# Patient Record
Sex: Female | Born: 1955 | Race: White | Hispanic: No | Marital: Married | Smoking: Never smoker
Health system: Southern US, Community
[De-identification: ages and names within clinical notes are randomized; demographics above are authoritative.]

## PROBLEM LIST (undated history)

## (undated) DIAGNOSIS — B029 Zoster without complications: Secondary | ICD-10-CM

## (undated) DIAGNOSIS — F40240 Claustrophobia: Secondary | ICD-10-CM

## (undated) DIAGNOSIS — K859 Acute pancreatitis without necrosis or infection, unspecified: Secondary | ICD-10-CM

## (undated) HISTORY — PX: BREAST BIOPSY: SHX20

## (undated) HISTORY — DX: Claustrophobia: F40.240

## (undated) HISTORY — PX: CHOLECYSTECTOMY: SHX55

## (undated) HISTORY — PX: EXPLORATORY LAPAROTOMY: SUR591

## (undated) HISTORY — DX: Acute pancreatitis without necrosis or infection, unspecified: K85.90

## (undated) HISTORY — DX: Zoster without complications: B02.9

## (undated) HISTORY — PX: TRIGGER FINGER RELEASE: SHX641

---

## 1999-04-13 ENCOUNTER — Other Ambulatory Visit: Admission: RE | Admit: 1999-04-13 | Discharge: 1999-04-13 | Payer: Self-pay | Admitting: Obstetrics and Gynecology

## 2000-08-30 ENCOUNTER — Other Ambulatory Visit: Admission: RE | Admit: 2000-08-30 | Discharge: 2000-08-30 | Payer: Self-pay | Admitting: Obstetrics and Gynecology

## 2001-09-25 ENCOUNTER — Other Ambulatory Visit: Admission: RE | Admit: 2001-09-25 | Discharge: 2001-09-25 | Payer: Self-pay | Admitting: Obstetrics and Gynecology

## 2002-12-22 ENCOUNTER — Other Ambulatory Visit: Admission: RE | Admit: 2002-12-22 | Discharge: 2002-12-22 | Payer: Self-pay | Admitting: Obstetrics and Gynecology

## 2004-09-18 DIAGNOSIS — B029 Zoster without complications: Secondary | ICD-10-CM

## 2004-09-18 HISTORY — DX: Zoster without complications: B02.9

## 2005-04-05 ENCOUNTER — Other Ambulatory Visit: Admission: RE | Admit: 2005-04-05 | Discharge: 2005-04-05 | Payer: Self-pay | Admitting: Obstetrics and Gynecology

## 2005-09-18 LAB — HM COLONOSCOPY

## 2006-04-10 ENCOUNTER — Ambulatory Visit: Payer: Self-pay | Admitting: Family Medicine

## 2006-04-13 ENCOUNTER — Ambulatory Visit: Payer: Self-pay | Admitting: Family Medicine

## 2006-05-09 ENCOUNTER — Ambulatory Visit: Payer: Self-pay | Admitting: Unknown Physician Specialty

## 2006-07-02 ENCOUNTER — Ambulatory Visit: Payer: Self-pay | Admitting: Unknown Physician Specialty

## 2007-05-15 ENCOUNTER — Other Ambulatory Visit: Admission: RE | Admit: 2007-05-15 | Discharge: 2007-05-15 | Payer: Self-pay | Admitting: Obstetrics and Gynecology

## 2008-05-28 ENCOUNTER — Other Ambulatory Visit: Admission: RE | Admit: 2008-05-28 | Discharge: 2008-05-28 | Payer: Self-pay | Admitting: Obstetrics and Gynecology

## 2008-06-05 ENCOUNTER — Inpatient Hospital Stay: Payer: Self-pay | Admitting: Internal Medicine

## 2008-06-05 ENCOUNTER — Other Ambulatory Visit: Payer: Self-pay

## 2008-06-15 ENCOUNTER — Ambulatory Visit: Payer: Self-pay | Admitting: Unknown Physician Specialty

## 2008-06-26 ENCOUNTER — Ambulatory Visit: Payer: Self-pay | Admitting: Gastroenterology

## 2008-09-18 LAB — HM PAP SMEAR

## 2009-06-07 ENCOUNTER — Other Ambulatory Visit: Admission: RE | Admit: 2009-06-07 | Discharge: 2009-06-07 | Payer: Self-pay | Admitting: Obstetrics and Gynecology

## 2010-07-21 ENCOUNTER — Other Ambulatory Visit: Admission: RE | Admit: 2010-07-21 | Discharge: 2010-07-21 | Payer: Self-pay | Admitting: Obstetrics and Gynecology

## 2010-09-13 ENCOUNTER — Ambulatory Visit: Payer: Self-pay | Admitting: General Surgery

## 2010-09-14 LAB — PATHOLOGY REPORT

## 2011-02-17 LAB — HM MAMMOGRAPHY

## 2011-10-19 ENCOUNTER — Encounter: Payer: Self-pay | Admitting: Internal Medicine

## 2011-10-19 ENCOUNTER — Ambulatory Visit (INDEPENDENT_AMBULATORY_CARE_PROVIDER_SITE_OTHER): Payer: BC Managed Care – PPO | Admitting: Internal Medicine

## 2011-10-19 ENCOUNTER — Other Ambulatory Visit (HOSPITAL_COMMUNITY)
Admission: RE | Admit: 2011-10-19 | Discharge: 2011-10-19 | Disposition: A | Discharge status: Home / Self Care | Payer: BC Managed Care – PPO | Source: Ambulatory Visit | Attending: Internal Medicine | Admitting: Internal Medicine

## 2011-10-19 DIAGNOSIS — Z1159 Encounter for screening for other viral diseases: Secondary | ICD-10-CM | POA: Insufficient documentation

## 2011-10-19 DIAGNOSIS — Z01419 Encounter for gynecological examination (general) (routine) without abnormal findings: Secondary | ICD-10-CM | POA: Insufficient documentation

## 2011-10-19 DIAGNOSIS — Z0000 Encounter for general adult medical examination without abnormal findings: Secondary | ICD-10-CM | POA: Insufficient documentation

## 2011-10-19 LAB — COMPREHENSIVE METABOLIC PANEL
ALT: 21 U/L (ref 0–35)
AST: 23 U/L (ref 0–37)
Albumin: 4.8 g/dL (ref 3.5–5.2)
Alkaline Phosphatase: 78 U/L (ref 39–117)
BUN: 14 mg/dL (ref 6–23)
CO2: 26 mEq/L (ref 19–32)
Calcium: 9.4 mg/dL (ref 8.4–10.5)
Chloride: 105 mEq/L (ref 96–112)
Creatinine, Ser: 0.6 mg/dL (ref 0.4–1.2)
GFR: 107.05 mL/min (ref >60.00)
Glucose, Bld: 89 mg/dL (ref 70–99)
Potassium: 3.7 mEq/L (ref 3.5–5.1)
Sodium: 141 mEq/L (ref 135–145)
Total Bilirubin: 1 mg/dL (ref 0.3–1.2)
Total Protein: 7.8 g/dL (ref 6.0–8.3)

## 2011-10-19 LAB — CBC WITH DIFFERENTIAL/PLATELET
Basophils Absolute: 0 10*3/uL (ref 0.0–0.1)
Basophils Relative: 0.6 % (ref 0.0–3.0)
Eosinophils Absolute: 0.1 10*3/uL (ref 0.0–0.7)
Eosinophils Relative: 1.4 % (ref 0.0–5.0)
HCT: 41.7 % (ref 36.0–46.0)
Hemoglobin: 14.4 g/dL (ref 12.0–15.0)
Lymphocytes Relative: 41 % (ref 12.0–46.0)
Lymphs Abs: 2.1 10*3/uL (ref 0.7–4.0)
MCHC: 34.5 g/dL (ref 30.0–36.0)
MCV: 97.6 fl (ref 78.0–100.0)
Monocytes Absolute: 0.3 10*3/uL (ref 0.1–1.0)
Monocytes Relative: 6.2 % (ref 3.0–12.0)
Neutro Abs: 2.6 10*3/uL (ref 1.4–7.7)
Neutrophils Relative %: 50.8 % (ref 43.0–77.0)
Platelets: 207 10*3/uL (ref 150.0–400.0)
RBC: 4.27 Mil/uL (ref 3.87–5.11)
RDW: 13.2 % (ref 11.5–14.6)
WBC: 5.1 10*3/uL (ref 4.5–10.5)

## 2011-10-19 LAB — LIPID PANEL
Cholesterol: 288 mg/dL — ABNORMAL HIGH (ref 0–200)
HDL: 110.4 mg/dL (ref >39.00)
Total CHOL/HDL Ratio: 3
Triglycerides: 51 mg/dL (ref 0.0–149.0)
VLDL: 10.2 mg/dL (ref 0.0–40.0)

## 2011-10-19 LAB — LDL CHOLESTEROL, DIRECT: Direct LDL: 151.3 mg/dL

## 2011-10-19 NOTE — Assessment & Plan Note (Signed)
Exam is normal today. Pap is pending. Patient is up-to-date on health maintenance. Will check CBC, CMP, lipid profile with labs today. Followup in one year.

## 2011-10-19 NOTE — Progress Notes (Signed)
Subjective:    Patient ID: Kelly [name], female    DOB: 1956-06-26, 56 y.o.   MRN: [id number]  HPI 56 year old female presents for her annual exam. She denies any complaints today. She follows a healthy lifestyle with healthy diet and regular exercise. She exercises by a using an elliptical machine several days per week. She reports normal energy level. She denies any recent fever, chills, fatigue, or other complaints.  Outpatient Encounter Prescriptions as of 10/19/2011  Medication Sig Dispense Refill  . cholecalciferol (VITAMIN D) 1000 UNITS tablet Take 1,000 Units by mouth daily.        Review of Systems  Constitutional: Negative for fever, chills, appetite change, fatigue and unexpected weight change.  HENT: Negative for ear pain, congestion, sore throat, trouble swallowing, neck pain, voice change and sinus pressure.   Eyes: Negative for visual disturbance.  Respiratory: Negative for cough, shortness of breath, wheezing and stridor.   Cardiovascular: Negative for chest pain, palpitations and leg swelling.  Gastrointestinal: Negative for nausea, vomiting, abdominal pain, diarrhea, constipation, blood in stool, abdominal distention and anal bleeding.  Genitourinary: Negative for dysuria and flank pain.  Musculoskeletal: Negative for myalgias, arthralgias and gait problem.  Skin: Negative for color change and rash.  Neurological: Negative for dizziness and headaches.  Hematological: Negative for adenopathy. Does not bruise/bleed easily.  Psychiatric/Behavioral: Negative for suicidal ideas, sleep disturbance and dysphoric mood. The patient is not nervous/anxious.    BP 88/58  Pulse 79  Temp(Src) 97.9 F (36.6 C) (Oral)  Ht 5\' 3"  (1.6 m)  Wt 140 lb (63.504 kg)  BMI 24.80 kg/m2  SpO2 97%     Objective:   Physical Exam  Constitutional: She is oriented to person, place, and time. She appears well-developed and well-nourished. No distress.  HENT:  Head: Normocephalic and  atraumatic.  Right Ear: External ear normal.  Left Ear: External ear normal.  Nose: Nose normal.  Mouth/Throat: Oropharynx is clear and moist. No oropharyngeal exudate.  Eyes: Conjunctivae are normal. Pupils are equal, round, and reactive to light. Right eye exhibits no discharge. Left eye exhibits no discharge. No scleral icterus.  Neck: Normal range of motion. Neck supple. No tracheal deviation present. No thyromegaly present.  Cardiovascular: Normal rate, regular rhythm, normal heart sounds and intact distal pulses.  Exam reveals no gallop and no friction rub.   No murmur heard. Pulmonary/Chest: Effort normal and breath sounds normal. No respiratory distress. She has no wheezes. She has no rales. She exhibits no tenderness.  Abdominal: Soft. Bowel sounds are normal. She exhibits no distension and no mass. There is no tenderness. There is no rebound and no guarding.  Genitourinary: Vagina normal and uterus normal. No breast swelling, tenderness, discharge or bleeding. Pelvic exam was performed with patient prone. There is no rash, tenderness or lesion on the right labia. There is no rash, tenderness or lesion on the left labia. Uterus is not enlarged and not tender. Cervix exhibits no motion tenderness, no discharge and no friability. Right adnexum displays no mass, no tenderness and no fullness. Left adnexum displays no mass, no tenderness and no fullness. No erythema or tenderness around the vagina. No vaginal discharge found.  Musculoskeletal: Normal range of motion. She exhibits no edema and no tenderness.  Lymphadenopathy:    She has no cervical adenopathy.  Neurological: She is alert and oriented to person, place, and time. No cranial nerve deficit. She exhibits normal muscle tone. Coordination normal.  Skin: Skin is warm and dry. No rash noted. She  is not diaphoretic. No erythema. No pallor.  Psychiatric: She has a normal mood and affect. Her behavior is normal. Judgment and thought content  normal.          Assessment & Plan:

## 2011-10-25 LAB — HM PAP SMEAR: HM Pap smear: NEGATIVE

## 2011-10-27 ENCOUNTER — Telehealth: Payer: Self-pay | Admitting: Internal Medicine

## 2011-10-27 NOTE — Telephone Encounter (Signed)
PAP normal

## 2011-11-20 ENCOUNTER — Ambulatory Visit: Payer: Self-pay | Admitting: Gastroenterology

## 2011-11-20 LAB — HM COLONOSCOPY

## 2011-12-07 ENCOUNTER — Encounter: Payer: Self-pay | Admitting: Internal Medicine

## 2012-11-02 ENCOUNTER — Other Ambulatory Visit: Payer: Self-pay

## 2012-11-07 LAB — HM MAMMOGRAPHY: HM Mammogram: NORMAL

## 2012-11-21 ENCOUNTER — Ambulatory Visit (INDEPENDENT_AMBULATORY_CARE_PROVIDER_SITE_OTHER): Payer: 59 | Admitting: Internal Medicine

## 2012-11-21 ENCOUNTER — Encounter: Payer: Self-pay | Admitting: Internal Medicine

## 2012-11-21 VITALS — BP 98/64 | HR 63 | Temp 98.0°F | Ht 63.0 in | Wt 144.0 lb

## 2012-11-21 DIAGNOSIS — Z0000 Encounter for general adult medical examination without abnormal findings: Secondary | ICD-10-CM

## 2012-11-21 NOTE — Progress Notes (Signed)
Subjective:    Patient ID: Kelly [name], female    DOB: October 12, 1955, 57 y.o.   MRN: [id number]  HPI 57YO female presents for annual exam. Doing well. No new concerns today. In 2013, had abnormal mammogram with microcalcifications in left breast. Underwent breast biopsy, which was normal. Had follow up mammogram in 10/2012 which was normal per pt. Follows healthy diet and regular exercise program.   Outpatient Encounter Prescriptions as of 11/21/2012  Medication Sig Dispense Refill  . cholecalciferol (VITAMIN D) 1000 UNITS tablet Take 1,000 Units by mouth daily.       No facility-administered encounter medications on file as of 11/21/2012.   BP 98/64  Pulse 63  Temp(Src) 98 F (36.7 C) (Oral)  Ht 5\' 3"  (1.6 m)  Wt 144 lb (65.318 kg)  BMI 25.51 kg/m2  SpO2 97%  Review of Systems  Constitutional: Negative for fever, chills, appetite change, fatigue and unexpected weight change.  HENT: Negative for ear pain, congestion, sore throat, trouble swallowing, neck pain, voice change and sinus pressure.   Eyes: Negative for visual disturbance.  Respiratory: Negative for cough, shortness of breath, wheezing and stridor.   Cardiovascular: Negative for chest pain, palpitations and leg swelling.  Gastrointestinal: Negative for nausea, vomiting, abdominal pain, diarrhea, constipation, blood in stool, abdominal distention and anal bleeding.  Genitourinary: Negative for dysuria and flank pain.  Musculoskeletal: Negative for myalgias, arthralgias and gait problem.  Skin: Negative for color change and rash.  Neurological: Negative for dizziness and headaches.  Hematological: Negative for adenopathy. Does not bruise/bleed easily.  Psychiatric/Behavioral: Negative for suicidal ideas, sleep disturbance and dysphoric mood. The patient is not nervous/anxious.        Objective:   Physical Exam  Constitutional: She is oriented to person, place, and time. She appears well-developed and well-nourished. No  distress.  HENT:  Head: Normocephalic and atraumatic.  Right Ear: External ear normal.  Left Ear: External ear normal.  Nose: Nose normal.  Mouth/Throat: Oropharynx is clear and moist. No oropharyngeal exudate.  Eyes: Conjunctivae are normal. Pupils are equal, round, and reactive to light. Right eye exhibits no discharge. Left eye exhibits no discharge. No scleral icterus.  Neck: Normal range of motion. Neck supple. No tracheal deviation present. No thyromegaly present.  Cardiovascular: Normal rate, regular rhythm, normal heart sounds and intact distal pulses.  Exam reveals no gallop and no friction rub.   No murmur heard. Pulmonary/Chest: Effort normal and breath sounds normal. No accessory muscle usage. Not tachypneic. No respiratory distress. She has no decreased breath sounds. She has no wheezes. She has no rhonchi. She has no rales. She exhibits no tenderness. Right breast exhibits no inverted nipple, no mass, no nipple discharge, no skin change and no tenderness. Left breast exhibits no inverted nipple, no mass, no nipple discharge, no skin change and no tenderness. Breasts are symmetrical.  Abdominal: Soft. Bowel sounds are normal. She exhibits no distension and no mass. There is no tenderness. There is no rebound and no guarding.  Musculoskeletal: Normal range of motion. She exhibits no edema and no tenderness.  Lymphadenopathy:    She has no cervical adenopathy.  Neurological: She is alert and oriented to person, place, and time. No cranial nerve deficit. She exhibits normal muscle tone. Coordination normal.  Skin: Skin is warm and dry. No rash noted. She is not diaphoretic. No erythema. No pallor.  Psychiatric: She has a normal mood and affect. Her behavior is normal. Judgment and thought content normal.  Assessment & Plan:

## 2012-11-21 NOTE — Assessment & Plan Note (Signed)
General medical exam normal today including breast exam. PAP deferred given PAP 2013 normal and HPV neg. Pt will schedule mammogram through her office. Will request records on previous mammograms. Will check fasting labs including CMP, CBC, lipids with labs. Encouraged continued healthy diet and regular physical activity. Follow up 1 year and prn.

## 2013-07-24 ENCOUNTER — Other Ambulatory Visit: Payer: Self-pay

## 2013-10-14 ENCOUNTER — Telehealth: Payer: Self-pay | Admitting: Internal Medicine

## 2013-10-14 NOTE — Telephone Encounter (Signed)
Pt left vm wanting to schedule cpe.  Tried calling pt back to schedule, no answer, no machine.  Unable to leave msg.  Last cpe 11/21/2012.

## 2013-10-21 NOTE — Telephone Encounter (Signed)
Left detailed message for patient to call the office and schedule an appointment with Dr. Gilford Rile. Any of the front desk could help her schedule this.

## 2013-10-23 ENCOUNTER — Encounter: Payer: Self-pay | Admitting: Internal Medicine

## 2013-10-24 ENCOUNTER — Ambulatory Visit (INDEPENDENT_AMBULATORY_CARE_PROVIDER_SITE_OTHER): Payer: 59 | Admitting: Podiatry

## 2013-10-24 ENCOUNTER — Encounter: Payer: Self-pay | Admitting: Podiatry

## 2013-10-24 VITALS — BP 129/60 | HR 76 | Resp 16

## 2013-10-24 DIAGNOSIS — M722 Plantar fascial fibromatosis: Secondary | ICD-10-CM

## 2013-10-24 MED ORDER — TRIAMCINOLONE ACETONIDE 10 MG/ML IJ SUSP
10.0000 mg | Freq: Once | INTRAMUSCULAR | Status: AC
Start: 2013-10-24 — End: 2013-10-24
  Administered 2013-10-24: 10 mg

## 2013-10-24 NOTE — Progress Notes (Signed)
Subjective:     Patient ID: Kelly [name], female   DOB: 09/29/1955, 58 y.o.   MRN: [id number]  HPI patient states her heel has been hurting that she's not been able to get in here and it's been quite tender when she sits and gets up on it or when she gets up in the morning. Not as bad as she ambulates through the day   Review of Systems     Objective:   Physical Exam Neurovascular status is found to be intact with discomfort of an intense nature upon palpation plantar heel at the insertion of the tendon into the calcaneus    Assessment:     Plantar fasciitis left heel of an acute nature    Plan:     Reviewed the chronic and the acute nature of plantar fasciitis and today injected the plantar fascia 3 mg Kenalog 5 like Marcaine mixture and dispensed night splint for the acute nature of this condition. I then went ahead and I scanned for custom orthotics to try to help with the chronic element her condition

## 2013-10-24 NOTE — Progress Notes (Signed)
Left heel has been the same since last visit. More painful when off of foot , and get back up on it

## 2013-11-04 ENCOUNTER — Encounter: Payer: Self-pay | Admitting: *Deleted

## 2013-11-04 NOTE — Progress Notes (Signed)
Sent pt postcard to let her know orthotics are here. 

## 2013-11-06 LAB — HM MAMMOGRAPHY: HM Mammogram: NORMAL

## 2013-11-10 ENCOUNTER — Encounter: Payer: Self-pay | Admitting: *Deleted

## 2013-11-21 ENCOUNTER — Ambulatory Visit (INDEPENDENT_AMBULATORY_CARE_PROVIDER_SITE_OTHER): Payer: 59 | Admitting: Podiatry

## 2013-11-21 VITALS — BP 108/64 | HR 70 | Resp 16 | Ht 63.0 in | Wt 144.0 lb

## 2013-11-21 DIAGNOSIS — M722 Plantar fascial fibromatosis: Secondary | ICD-10-CM

## 2013-11-21 NOTE — Progress Notes (Signed)
Subjective:     Patient ID: Kelly [name], female   DOB: 01/07/1956, 58 y.o.   MRN: [id number]  HPI patient presents stating my heel is feeling better than previously with mild discomfort if I have done a lot of walking   Review of Systems     Objective:   Physical Exam Neurovascular status intact with significant diminishment of discomfort left heel with mild pain with deep pressure    Assessment:     Plan her fasciitis improving left foot    Plan:     H&P performed and at this time dispensed orthotics with instructions and discussed continued physical therapy for this patient

## 2013-11-21 NOTE — Patient Instructions (Signed)

## 2013-12-04 ENCOUNTER — Encounter: Payer: Self-pay | Admitting: Internal Medicine

## 2013-12-04 ENCOUNTER — Ambulatory Visit (INDEPENDENT_AMBULATORY_CARE_PROVIDER_SITE_OTHER): Payer: 59 | Admitting: Internal Medicine

## 2013-12-04 VITALS — BP 100/70 | HR 67 | Temp 98.3°F | Resp 15 | Ht 63.0 in | Wt 146.0 lb

## 2013-12-04 DIAGNOSIS — Z0000 Encounter for general adult medical examination without abnormal findings: Secondary | ICD-10-CM

## 2013-12-04 MED ORDER — ZOSTER VACCINE LIVE 19400 UNT/0.65ML ~~LOC~~ SOLR: 0.6500 mL | Freq: Once | SUBCUTANEOUS | Status: DC

## 2013-12-04 NOTE — Assessment & Plan Note (Signed)
General medical exam including breast exam normal today. PAP and pelvic deferred as normal 2013. Plan repeat 2013. Mammogram reviewed and was normal. Colonoscopy UTD. Will check labs including CBC, CMP, lipids, TSH, VitD.

## 2013-12-04 NOTE — Progress Notes (Signed)
Subjective:    Patient ID: Kelly [name], female    DOB: Apr 24, 1956, 58 y.o.   MRN: [id number]  HPI 58YO female presents for annual exam.  Left foot plantar fasciitis - s/p cortisone injection x 2 Dr. Paulla Dolly.  Flu symptoms in January, now resolved.  Generally, feeling well. Had recent mammogram which was normal. Last PAP 2013 normal, HPV neg.  Continues to follow healthy diet. Exercises by working out at [employer] several days per week.    Review of Systems  Constitutional: Negative for fever, chills, appetite change, fatigue and unexpected weight change.  HENT: Negative for congestion, ear pain, sinus pressure, sore throat, trouble swallowing and voice change.   Eyes: Negative for visual disturbance.  Respiratory: Negative for cough, shortness of breath, wheezing and stridor.   Cardiovascular: Negative for chest pain, palpitations and leg swelling.  Gastrointestinal: Negative for nausea, vomiting, abdominal pain, diarrhea, constipation, blood in stool, abdominal distention and anal bleeding.  Genitourinary: Negative for dysuria and flank pain.  Musculoskeletal: Negative for arthralgias, gait problem, myalgias and neck pain.  Skin: Negative for color change and rash.  Neurological: Negative for dizziness and headaches.  Hematological: Negative for adenopathy. Does not bruise/bleed easily.  Psychiatric/Behavioral: Negative for suicidal ideas, sleep disturbance and dysphoric mood. The patient is not nervous/anxious.        Objective:    BP 100/70  Pulse 67  Temp(Src) 98.3 F (36.8 C) (Oral)  Resp 15  Ht 5\' 3"  (1.6 m)  Wt 146 lb (66.225 kg)  BMI 25.87 kg/m2  SpO2 97% Physical Exam  Constitutional: She is oriented to person, place, and time. She appears well-developed and well-nourished. No distress.  HENT:  Head: Normocephalic and atraumatic.  Right Ear: External ear normal.  Left Ear: External ear normal.  Nose: Nose normal.  Mouth/Throat: Oropharynx is clear and  moist. No oropharyngeal exudate.  Eyes: Conjunctivae are normal. Pupils are equal, round, and reactive to light. Right eye exhibits no discharge. Left eye exhibits no discharge. No scleral icterus.  Neck: Normal range of motion. Neck supple. No tracheal deviation present. No thyromegaly present.  Cardiovascular: Normal rate, regular rhythm, normal heart sounds and intact distal pulses.  Exam reveals no gallop and no friction rub.   No murmur heard. Pulmonary/Chest: Effort normal and breath sounds normal. No accessory muscle usage. Not tachypneic. No respiratory distress. She has no decreased breath sounds. She has no wheezes. She has no rales. She exhibits no tenderness. Right breast exhibits no inverted nipple, no mass, no nipple discharge, no skin change and no tenderness. Left breast exhibits no inverted nipple, no mass, no nipple discharge, no skin change and no tenderness. Breasts are symmetrical.  Abdominal: Soft. Bowel sounds are normal. She exhibits no distension and no mass. There is no tenderness. There is no rebound and no guarding.  Musculoskeletal: Normal range of motion. She exhibits no edema and no tenderness.  Lymphadenopathy:    She has no cervical adenopathy.  Neurological: She is alert and oriented to person, place, and time. No cranial nerve deficit. She exhibits normal muscle tone. Coordination normal.  Skin: Skin is warm and dry. No rash noted. She is not diaphoretic. No erythema. No pallor.  Psychiatric: She has a normal mood and affect. Her behavior is normal. Judgment and thought content normal.          Assessment & Plan:   Problem List Items Addressed This Visit   Routine general medical examination at a health care facility - Primary  General medical exam including breast exam normal today. PAP and pelvic deferred as normal 2013. Plan repeat 2013. Mammogram reviewed and was normal. Colonoscopy UTD. Will check labs including CBC, CMP, lipids, TSH, VitD.      Relevant Orders      CBC with Differential      Comprehensive metabolic panel      Lipid panel      Microalbumin / creatinine urine ratio      Vit D  25 hydroxy (rtn osteoporosis monitoring)      TSH       Return in about 1 year (around 12/05/2014) for Physical.

## 2013-12-04 NOTE — Progress Notes (Signed)
Pre visit review using our clinic review tool, if applicable. No additional management support is needed unless otherwise documented below in the visit note. 

## 2013-12-09 ENCOUNTER — Other Ambulatory Visit (INDEPENDENT_AMBULATORY_CARE_PROVIDER_SITE_OTHER): Payer: 59

## 2013-12-09 DIAGNOSIS — Z0000 Encounter for general adult medical examination without abnormal findings: Secondary | ICD-10-CM

## 2013-12-09 LAB — LIPID PANEL
Cholesterol: 278 mg/dL — ABNORMAL HIGH (ref 0–200)
HDL: 99.4 mg/dL (ref >39.00)
LDL Cholesterol: 167 mg/dL — ABNORMAL HIGH (ref 0–99)
Total CHOL/HDL Ratio: 3
Triglycerides: 60 mg/dL (ref 0.0–149.0)
VLDL: 12 mg/dL (ref 0.0–40.0)

## 2013-12-09 LAB — CBC WITH DIFFERENTIAL/PLATELET
Basophils Absolute: 0 10*3/uL (ref 0.0–0.1)
Basophils Relative: 0.4 % (ref 0.0–3.0)
Eosinophils Absolute: 0.1 10*3/uL (ref 0.0–0.7)
Eosinophils Relative: 1.4 % (ref 0.0–5.0)
HCT: 42.4 % (ref 36.0–46.0)
Hemoglobin: 14.2 g/dL (ref 12.0–15.0)
Lymphocytes Relative: 37.3 % (ref 12.0–46.0)
Lymphs Abs: 2.1 10*3/uL (ref 0.7–4.0)
MCHC: 33.5 g/dL (ref 30.0–36.0)
MCV: 97 fl (ref 78.0–100.0)
Monocytes Absolute: 0.3 10*3/uL (ref 0.1–1.0)
Monocytes Relative: 5.9 % (ref 3.0–12.0)
Neutro Abs: 3.1 10*3/uL (ref 1.4–7.7)
Neutrophils Relative %: 55 % (ref 43.0–77.0)
Platelets: 215 10*3/uL (ref 150.0–400.0)
RBC: 4.37 Mil/uL (ref 3.87–5.11)
RDW: 13.5 % (ref 11.5–14.6)
WBC: 5.7 10*3/uL (ref 4.5–10.5)

## 2013-12-09 LAB — COMPREHENSIVE METABOLIC PANEL
ALT: 19 U/L (ref 0–35)
AST: 19 U/L (ref 0–37)
Albumin: 4.7 g/dL (ref 3.5–5.2)
Alkaline Phosphatase: 63 U/L (ref 39–117)
BUN: 19 mg/dL (ref 6–23)
CO2: 25 mEq/L (ref 19–32)
Calcium: 9.7 mg/dL (ref 8.4–10.5)
Chloride: 103 mEq/L (ref 96–112)
Creatinine, Ser: 0.7 mg/dL (ref 0.4–1.2)
GFR: 95.36 mL/min (ref >60.00)
Glucose, Bld: 93 mg/dL (ref 70–99)
Potassium: 3.7 mEq/L (ref 3.5–5.1)
Sodium: 138 mEq/L (ref 135–145)
Total Bilirubin: 1.3 mg/dL — ABNORMAL HIGH (ref 0.3–1.2)
Total Protein: 7.7 g/dL (ref 6.0–8.3)

## 2013-12-09 LAB — MICROALBUMIN / CREATININE URINE RATIO
Creatinine,U: 116.8 mg/dL
Microalb Creat Ratio: 3.9 mg/g (ref 0.0–30.0)
Microalb, Ur: 4.5 mg/dL — ABNORMAL HIGH (ref 0.0–1.9)

## 2013-12-09 LAB — TSH: TSH: 3 u[IU]/mL (ref 0.35–5.50)

## 2013-12-10 LAB — VITAMIN D 25 HYDROXY (VIT D DEFICIENCY, FRACTURES): Vit D, 25-Hydroxy: 47 ng/mL (ref 30–89)

## 2013-12-15 ENCOUNTER — Encounter: Payer: Self-pay | Admitting: Internal Medicine

## 2014-01-02 ENCOUNTER — Ambulatory Visit: Payer: 59 | Admitting: Podiatry

## 2014-01-16 ENCOUNTER — Ambulatory Visit (INDEPENDENT_AMBULATORY_CARE_PROVIDER_SITE_OTHER): Payer: 59 | Admitting: Podiatry

## 2014-01-16 VITALS — Resp 16 | Ht 63.0 in | Wt 144.0 lb

## 2014-01-16 DIAGNOSIS — M722 Plantar fascial fibromatosis: Secondary | ICD-10-CM

## 2014-01-18 NOTE — Progress Notes (Signed)
Subjective:     Patient ID: Kelly [name], female   DOB: Nov 27, 1955, 58 y.o.   MRN: [id number]  HPI patient states that the pain has reduced quite a bit in her left foot and she still walking somewhat with discomfort only after extended period   Review of Systems     Objective:   Physical Exam Neurovascular status intact   with patient having minimal discomfort plantar heel left at the insertion of the tendon into the calcaneus Assessment:     Improve plantar fasciitis left with orthotics and stretch    Plan:     Reviewed continuation of conservative care and hope that we will not require more advanced type treatments. Reappoint if symptoms were to recur

## 2014-09-20 ENCOUNTER — Ambulatory Visit: Payer: Self-pay | Admitting: Obstetrics & Gynecology

## 2014-09-20 LAB — COMPREHENSIVE METABOLIC PANEL
Albumin: 4 g/dL (ref 3.4–5.0)
Alkaline Phosphatase: 86 U/L
Anion Gap: 8 (ref 7–16)
BUN: 15 mg/dL (ref 7–18)
Bilirubin,Total: 1 mg/dL (ref 0.2–1.0)
Calcium, Total: 9 mg/dL (ref 8.5–10.1)
Chloride: 106 mmol/L (ref 98–107)
Co2: 27 mmol/L (ref 21–32)
Creatinine: 0.64 mg/dL (ref 0.60–1.30)
EGFR (African American): >60
EGFR (Non-African Amer.): >60
Glucose: 82 mg/dL (ref 65–99)
Osmolality: 281 (ref 275–301)
Potassium: 3.7 mmol/L (ref 3.5–5.1)
SGOT(AST): 27 U/L (ref 15–37)
SGPT (ALT): 25 U/L
Sodium: 141 mmol/L (ref 136–145)
Total Protein: 7.8 g/dL (ref 6.4–8.2)

## 2014-09-20 LAB — CBC WITH DIFFERENTIAL/PLATELET
Basophil #: 0 10*3/uL (ref 0.0–0.1)
Basophil %: 0.8 %
Eosinophil #: 0.1 10*3/uL (ref 0.0–0.7)
Eosinophil %: 1.1 %
HCT: 43 % (ref 35.0–47.0)
HGB: 14.3 g/dL (ref 12.0–16.0)
Lymphocyte #: 2.1 10*3/uL (ref 1.0–3.6)
Lymphocyte %: 31.5 %
MCH: 33 pg (ref 26.0–34.0)
MCHC: 33.3 g/dL (ref 32.0–36.0)
MCV: 99 fL (ref 80–100)
Monocyte #: 0.4 x10 3/mm (ref 0.2–0.9)
Monocyte %: 5.8 %
Neutrophil #: 4 10*3/uL (ref 1.4–6.5)
Neutrophil %: 60.8 %
Platelet: 200 10*3/uL (ref 150–440)
RBC: 4.34 10*6/uL (ref 3.80–5.20)
RDW: 13.2 % (ref 11.5–14.5)
WBC: 6.6 10*3/uL (ref 3.6–11.0)

## 2014-09-20 LAB — LIPASE, BLOOD: Lipase: 4920 U/L — ABNORMAL HIGH (ref 73–393)

## 2014-09-21 ENCOUNTER — Telehealth: Payer: Self-pay | Admitting: Internal Medicine

## 2014-09-21 ENCOUNTER — Ambulatory Visit: Payer: Self-pay | Admitting: Obstetrics and Gynecology

## 2014-09-21 LAB — LIPASE, BLOOD: Lipase: 6679 U/L — ABNORMAL HIGH (ref 73–393)

## 2014-09-21 NOTE — Telephone Encounter (Signed)
I was able to schedule.

## 2014-09-21 NOTE — Telephone Encounter (Signed)
Left message for pt to return my call.

## 2014-09-21 NOTE — Telephone Encounter (Signed)
Pt notified and  verbalized understanding  Pt aware of appt, could you please add to schedule, thanks!

## 2014-09-21 NOTE — Telephone Encounter (Signed)
Needing an appointment to be seen . She wanted to be worked in on [employer] . She has pancreatitis .

## 2014-09-21 NOTE — Telephone Encounter (Signed)
3:30 tomorrow, however if having severe pain, nausea/vomiting, then needs ER evaluation today.

## 2014-09-21 NOTE — Telephone Encounter (Signed)
Please see below.

## 2014-09-22 ENCOUNTER — Telehealth: Payer: Self-pay | Admitting: Internal Medicine

## 2014-09-22 ENCOUNTER — Ambulatory Visit (INDEPENDENT_AMBULATORY_CARE_PROVIDER_SITE_OTHER): Payer: Commercial Managed Care - PPO | Admitting: Internal Medicine

## 2014-09-22 ENCOUNTER — Encounter: Payer: Self-pay | Admitting: Internal Medicine

## 2014-09-22 ENCOUNTER — Other Ambulatory Visit: Payer: Self-pay | Admitting: Internal Medicine

## 2014-09-22 VITALS — BP 96/56 | HR 76 | Temp 98.3°F | Ht 63.0 in | Wt 144.5 lb

## 2014-09-22 DIAGNOSIS — K859 Acute pancreatitis without necrosis or infection, unspecified: Secondary | ICD-10-CM

## 2014-09-22 LAB — LIPASE: Lipase: 1483 U/L — ABNORMAL HIGH (ref 11.0–59.0)

## 2014-09-22 LAB — CBC WITH DIFFERENTIAL/PLATELET
Basophils Absolute: 0 10*3/uL (ref 0.0–0.1)
Basophils Relative: 0.3 % (ref 0.0–3.0)
Eosinophils Absolute: 0.2 10*3/uL (ref 0.0–0.7)
Eosinophils Relative: 2 % (ref 0.0–5.0)
HCT: 42.5 % (ref 36.0–46.0)
Hemoglobin: 14.4 g/dL (ref 12.0–15.0)
Lymphocytes Relative: 18.6 % (ref 12.0–46.0)
Lymphs Abs: 1.5 10*3/uL (ref 0.7–4.0)
MCHC: 33.8 g/dL (ref 30.0–36.0)
MCV: 96.7 fl (ref 78.0–100.0)
Monocytes Absolute: 0.3 10*3/uL (ref 0.1–1.0)
Monocytes Relative: 4.3 % (ref 3.0–12.0)
Neutro Abs: 5.9 10*3/uL (ref 1.4–7.7)
Neutrophils Relative %: 74.8 % (ref 43.0–77.0)
Platelets: 220 10*3/uL (ref 150.0–400.0)
RBC: 4.39 Mil/uL (ref 3.87–5.11)
RDW: 13 % (ref 11.5–15.5)
WBC: 7.9 10*3/uL (ref 4.0–10.5)

## 2014-09-22 LAB — COMPREHENSIVE METABOLIC PANEL
ALT: 15 U/L (ref 0–35)
AST: 20 U/L (ref 0–37)
Albumin: 4.6 g/dL (ref 3.5–5.2)
Alkaline Phosphatase: 77 U/L (ref 39–117)
BUN: 16 mg/dL (ref 6–23)
CO2: 26 mEq/L (ref 19–32)
Calcium: 9.3 mg/dL (ref 8.4–10.5)
Chloride: 103 mEq/L (ref 96–112)
Creatinine, Ser: 0.6 mg/dL (ref 0.4–1.2)
GFR: 112.33 mL/min (ref >60.00)
Glucose, Bld: 67 mg/dL — ABNORMAL LOW (ref 70–99)
Potassium: 4 mEq/L (ref 3.5–5.1)
Sodium: 138 mEq/L (ref 135–145)
Total Bilirubin: 1.6 mg/dL — ABNORMAL HIGH (ref 0.2–1.2)
Total Protein: 7.2 g/dL (ref 6.0–8.3)

## 2014-09-22 LAB — AMYLASE: Amylase: 1689 U/L — ABNORMAL HIGH (ref 27–131)

## 2014-09-22 NOTE — Patient Instructions (Signed)
Labs today.  We will call you with results.

## 2014-09-22 NOTE — Telephone Encounter (Signed)
Talked with pt.  Symptomatically, she is doing well with no abdominal pain. Lipase is trending down.  Will plan to repeat labs tomorrow. Set up follow up with GI. She will call or RTC if worsening pain, nausea, or other concerns.  Needs lab visit first thing tomorrow am.

## 2014-09-22 NOTE — Telephone Encounter (Signed)
The patient has been scheduled

## 2014-09-22 NOTE — Telephone Encounter (Signed)
Please add/double book pt for lab appt at 8:15 tomorrow morning

## 2014-09-22 NOTE — Assessment & Plan Note (Addendum)
Symptoms and exam are most consistent with acute pancreatitis. 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 Will repeat CBC, CMP, lipase today. If lipase increasing, discussed checking CT abdomen and admission for IVF. Offered Rx for pain medication. She declines.

## 2014-09-22 NOTE — Progress Notes (Signed)
Subjective:    Patient ID: Kelly [name], female    DOB: [birth year], 59 y.o.   MRN: [id number]  HPI 59YO female presents for acute visit.  Saturday night developed mid-epigastric "squeezing pain" after eating shrimp. Pain was off and on. Started Prilosec and Ibuprofen with minimal improvement. Went on clear liquid diet. No NVD. Having regular BM. Checked lipase on Sunday 4920. Repeat lipase Monday 6679. CBC was normal. CMP was normal per her report. Pain has improved somewhat today. Taking only occasional Motrin for pain.  Fourth episode of pancreatitis. First occurred with cholecystitis. Second occurred with oral hormone replacement. 2009 had another episode after being on Prednisone. Followed in past with Dr. Gustavo Lah. CT scan in 2009 showed no abnormalities.   Past medical, surgical, family and social history per today's encounter.  Review of Systems  Constitutional: Negative for fever, chills, appetite change, fatigue and unexpected weight change.  Eyes: Negative for visual disturbance.  Respiratory: Negative for shortness of breath.   Cardiovascular: Negative for chest pain and leg swelling.  Gastrointestinal: Positive for abdominal pain. Negative for nausea, vomiting, diarrhea, constipation, blood in stool, abdominal distention and anal bleeding.  Skin: Negative for color change and rash.  Hematological: Negative for adenopathy. Does not bruise/bleed easily.  Psychiatric/Behavioral: Negative for dysphoric mood. The patient is not nervous/anxious.        Objective:    BP 96/56 mmHg  Pulse 76  Temp(Src) 98.3 F (36.8 C) (Oral)  Ht 5\' 3"  (1.6 m)  Wt 144 lb 8 oz (65.545 kg)  BMI 25.60 kg/m2  SpO2 97% Physical Exam  Constitutional: She is oriented to person, place, and time. She appears well-developed and well-nourished. No distress.  HENT:  Head: Normocephalic and atraumatic.  Right Ear: External ear normal.  Left Ear: External ear normal.  Nose: Nose normal.    Mouth/Throat: Oropharynx is clear and moist. No oropharyngeal exudate.  Eyes: Conjunctivae and EOM are normal. Pupils are equal, round, and reactive to light. Right eye exhibits no discharge. Left eye exhibits no discharge. No scleral icterus.  Neck: Normal range of motion. Neck supple. No tracheal deviation present. No thyromegaly present.  Cardiovascular: Normal rate, regular rhythm, normal heart sounds and intact distal pulses.  Exam reveals no gallop and no friction rub.   No murmur heard. Pulmonary/Chest: Effort normal and breath sounds normal. No accessory muscle usage. No tachypnea. No respiratory distress. She has no decreased breath sounds. She has no wheezes. She has no rhonchi. She has no rales. She exhibits no tenderness.  Abdominal: Soft. Bowel sounds are normal. She exhibits no distension and no mass. There is tenderness (epigastric area). There is no rebound and no guarding.  Musculoskeletal: Normal range of motion. She exhibits no edema or tenderness.  Lymphadenopathy:    She has no cervical adenopathy.  Neurological: She is alert and oriented to person, place, and time. No cranial nerve deficit. She exhibits normal muscle tone. Coordination normal.  Skin: Skin is warm and dry. No rash noted. She is not diaphoretic. No erythema. No pallor.  Psychiatric: She has a normal mood and affect. Her behavior is normal. Judgment and thought content normal.          Assessment & Plan:   Problem List Items Addressed This Visit      Unprioritized   Acute pancreatitis - Primary    Symptoms and exam are most consistent with acute pancreatitis. 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 Will  repeat CBC, CMP, lipase today. If lipase increasing, discussed checking CT abdomen and admission for IVF. Offered Rx for pain medication. She declines.    Relevant Medications      omeprazole (PRILOSEC) 20 MG capsule   Other Relevant Orders      CBC with Differential       Comprehensive metabolic panel      Lipase      Amylase       Return in about 1 week (around 09/29/2014) for Recheck.

## 2014-09-23 ENCOUNTER — Other Ambulatory Visit (INDEPENDENT_AMBULATORY_CARE_PROVIDER_SITE_OTHER): Payer: Commercial Managed Care - HMO

## 2014-09-23 DIAGNOSIS — K859 Acute pancreatitis, unspecified: Secondary | ICD-10-CM | POA: Diagnosis not present

## 2014-09-23 DIAGNOSIS — Z0000 Encounter for general adult medical examination without abnormal findings: Secondary | ICD-10-CM | POA: Diagnosis not present

## 2014-09-23 LAB — COMPREHENSIVE METABOLIC PANEL
ALT: 15 U/L (ref 0–35)
AST: 22 U/L (ref 0–37)
Albumin: 4.3 g/dL (ref 3.5–5.2)
Alkaline Phosphatase: 70 U/L (ref 39–117)
BUN: 13 mg/dL (ref 6–23)
CO2: 26 mEq/L (ref 19–32)
Calcium: 9.3 mg/dL (ref 8.4–10.5)
Chloride: 106 mEq/L (ref 96–112)
Creatinine, Ser: 0.6 mg/dL (ref 0.4–1.2)
GFR: 105.98 mL/min (ref >60.00)
Glucose, Bld: 89 mg/dL (ref 70–99)
Potassium: 4.2 mEq/L (ref 3.5–5.1)
Sodium: 139 mEq/L (ref 135–145)
Total Bilirubin: 1.5 mg/dL — ABNORMAL HIGH (ref 0.2–1.2)
Total Protein: 7.3 g/dL (ref 6.0–8.3)

## 2014-09-23 LAB — CBC WITH DIFFERENTIAL/PLATELET
Basophils Absolute: 0 10*3/uL (ref 0.0–0.1)
Basophils Relative: 0.3 % (ref 0.0–3.0)
Eosinophils Absolute: 0.2 10*3/uL (ref 0.0–0.7)
Eosinophils Relative: 2.2 % (ref 0.0–5.0)
HCT: 41.2 % (ref 36.0–46.0)
Hemoglobin: 13.7 g/dL (ref 12.0–15.0)
Lymphocytes Relative: 22.2 % (ref 12.0–46.0)
Lymphs Abs: 1.6 10*3/uL (ref 0.7–4.0)
MCHC: 33.3 g/dL (ref 30.0–36.0)
MCV: 97.4 fl (ref 78.0–100.0)
Monocytes Absolute: 0.3 10*3/uL (ref 0.1–1.0)
Monocytes Relative: 3.9 % (ref 3.0–12.0)
Neutro Abs: 5 10*3/uL (ref 1.4–7.7)
Neutrophils Relative %: 71.4 % (ref 43.0–77.0)
Platelets: 208 10*3/uL (ref 150.0–400.0)
RBC: 4.23 Mil/uL (ref 3.87–5.11)
RDW: 13.2 % (ref 11.5–15.5)
WBC: 7.1 10*3/uL (ref 4.0–10.5)

## 2014-09-23 LAB — LIPID PANEL
Cholesterol: 270 mg/dL — ABNORMAL HIGH (ref 0–200)
HDL: 78.1 mg/dL (ref >39.00)
LDL Cholesterol: 178 mg/dL — ABNORMAL HIGH (ref 0–99)
NonHDL: 191.9
Total CHOL/HDL Ratio: 3
Triglycerides: 68 mg/dL (ref 0.0–149.0)
VLDL: 13.6 mg/dL (ref 0.0–40.0)

## 2014-09-24 ENCOUNTER — Telehealth: Payer: Self-pay | Admitting: *Deleted

## 2014-09-24 ENCOUNTER — Other Ambulatory Visit

## 2014-09-24 DIAGNOSIS — IMO0001 Reserved for inherently not codable concepts without codable children: Secondary | ICD-10-CM

## 2014-09-24 LAB — LIPASE: Lipase: 2902 U/L — ABNORMAL HIGH (ref 0–75)

## 2014-09-24 NOTE — Telephone Encounter (Signed)
FYI Our lab call they are sending pt lipase to [facility] to get a better reading

## 2014-09-25 ENCOUNTER — Ambulatory Visit: Payer: Self-pay | Admitting: Gastroenterology

## 2014-10-12 ENCOUNTER — Ambulatory Visit: Payer: Self-pay | Admitting: Gastroenterology

## 2014-11-19 ENCOUNTER — Other Ambulatory Visit: Payer: Self-pay | Admitting: Nurse Practitioner

## 2014-11-19 ENCOUNTER — Other Ambulatory Visit (HOSPITAL_COMMUNITY)
Admission: RE | Admit: 2014-11-19 | Discharge: 2014-11-19 | Disposition: A | Discharge status: Home / Self Care | Payer: 59 | Source: Ambulatory Visit | Attending: Nurse Practitioner | Admitting: Nurse Practitioner

## 2014-11-19 DIAGNOSIS — Z1151 Encounter for screening for human papillomavirus (HPV): Secondary | ICD-10-CM | POA: Diagnosis present

## 2014-11-19 DIAGNOSIS — Z01419 Encounter for gynecological examination (general) (routine) without abnormal findings: Secondary | ICD-10-CM | POA: Diagnosis present

## 2014-11-20 LAB — CYTOLOGY - PAP

## 2015-01-15 ENCOUNTER — Encounter: Payer: Self-pay | Admitting: Internal Medicine

## 2015-01-15 LAB — HM MAMMOGRAPHY

## 2015-11-10 DIAGNOSIS — M6530 Trigger finger, unspecified finger: Secondary | ICD-10-CM | POA: Insufficient documentation

## 2015-11-11 ENCOUNTER — Telehealth: Payer: Self-pay | Admitting: Internal Medicine

## 2015-11-11 NOTE — Telephone Encounter (Signed)
Dropped Attestation form to be filled out . Formed placed in folder to deliver to doctors box.

## 2015-11-15 ENCOUNTER — Encounter: Payer: Self-pay | Admitting: *Deleted

## 2015-11-15 NOTE — Telephone Encounter (Signed)
Forms are complete, sent MyChart message notifying pt, placed forms in pick up folder

## 2015-12-07 ENCOUNTER — Ambulatory Visit (INDEPENDENT_AMBULATORY_CARE_PROVIDER_SITE_OTHER): Payer: Commercial Managed Care - HMO | Admitting: Internal Medicine

## 2015-12-07 ENCOUNTER — Encounter: Payer: Self-pay | Admitting: Internal Medicine

## 2015-12-07 VITALS — BP 103/61 | HR 71 | Temp 97.6°F | Ht 62.5 in | Wt 147.4 lb

## 2015-12-07 DIAGNOSIS — Z0000 Encounter for general adult medical examination without abnormal findings: Secondary | ICD-10-CM | POA: Diagnosis not present

## 2015-12-07 NOTE — Assessment & Plan Note (Signed)
General medical exam normal today including breast exam. PAP and pelvic deferred as completed by her OB. Labs ordered. Immunizations UTD. Encouraged healthy diet and exercise.

## 2015-12-07 NOTE — Patient Instructions (Signed)
Health Maintenance, Female Adopting a healthy lifestyle and getting preventive care can go a long way to promote health and wellness. Talk with your health care provider about what schedule of regular examinations is right for you. This is a good chance for you to check in with your provider about disease prevention and staying healthy. In between checkups, there are plenty of things you can do on your own. Experts have done a lot of research about which lifestyle changes and preventive measures are most likely to keep you healthy. Ask your health care provider for more information. WEIGHT AND DIET  Eat a healthy diet  Be sure to include plenty of vegetables, fruits, low-fat dairy products, and lean protein.  Do not eat a lot of foods high in solid fats, added sugars, or salt.  Get regular exercise. This is one of the most important things you can do for your health.  Most adults should exercise for at least 150 minutes each week. The exercise should increase your heart rate and make you sweat (moderate-intensity exercise).  Most adults should also do strengthening exercises at least twice a week. This is in addition to the moderate-intensity exercise.  Maintain a healthy weight  Body mass index (BMI) is a measurement that can be used to identify possible weight problems. It estimates body fat based on height and weight. Your health care provider can help determine your BMI and help you achieve or maintain a healthy weight.  For females 20 years of age and older:   A BMI below 18.5 is considered underweight.  A BMI of 18.5 to 24.9 is normal.  A BMI of 25 to 29.9 is considered overweight.  A BMI of 30 and above is considered obese.  Watch levels of cholesterol and blood lipids  You should start having your blood tested for lipids and cholesterol at 62 years of age, then have this test every 5 years.  You may need to have your cholesterol levels checked more often if:  Your lipid  or cholesterol levels are high.  You are older than 62 years of age.  You are at high risk for heart disease.  CANCER SCREENING   Lung Cancer  Lung cancer screening is recommended for adults 55-80 years old who are at high risk for lung cancer because of a history of smoking.  A yearly low-dose CT scan of the lungs is recommended for people who:  Currently smoke.  Have quit within the past 15 years.  Have at least a 30-pack-year history of smoking. A pack year is smoking an average of one pack of cigarettes a day for 1 year.  Yearly screening should continue until it has been 15 years since you quit.  Yearly screening should stop if you develop a health problem that would prevent you from having lung cancer treatment.  Breast Cancer  Practice breast self-awareness. This means understanding how your breasts normally appear and feel.  It also means doing regular breast self-exams. Let your health care provider know about any changes, no matter how small.  If you are in your 20s or 30s, you should have a clinical breast exam (CBE) by a health care provider every 1-3 years as part of a regular health exam.  If you are 40 or older, have a CBE every year. Also consider having a breast X-ray (mammogram) every year.  If you have a family history of breast cancer, talk to your health care provider about genetic screening.  If you   are at high risk for breast cancer, talk to your health care provider about having an MRI and a mammogram every year.  Breast cancer gene (BRCA) assessment is recommended for women who have family members with BRCA-related cancers. BRCA-related cancers include:  Breast.  Ovarian.  Tubal.  Peritoneal cancers.  Results of the assessment will determine the need for genetic counseling and BRCA1 and BRCA2 testing. Cervical Cancer Your health care provider may recommend that you be screened regularly for cancer of the pelvic organs (ovaries, uterus, and  vagina). This screening involves a pelvic examination, including checking for microscopic changes to the surface of your cervix (Pap test). You may be encouraged to have this screening done every 3 years, beginning at age 21.  For women ages 30-65, health care providers may recommend pelvic exams and Pap testing every 3 years, or they may recommend the Pap and pelvic exam, combined with testing for human papilloma virus (HPV), every 5 years. Some types of HPV increase your risk of cervical cancer. Testing for HPV may also be done on women of any age with unclear Pap test results.  Other health care providers may not recommend any screening for nonpregnant women who are considered low risk for pelvic cancer and who do not have symptoms. Ask your health care provider if a screening pelvic exam is right for you.  If you have had past treatment for cervical cancer or a condition that could lead to cancer, you need Pap tests and screening for cancer for at least 20 years after your treatment. If Pap tests have been discontinued, your risk factors (such as having a new sexual partner) need to be reassessed to determine if screening should resume. Some women have medical problems that increase the chance of getting cervical cancer. In these cases, your health care provider may recommend more frequent screening and Pap tests. Colorectal Cancer  This type of cancer can be detected and often prevented.  Routine colorectal cancer screening usually begins at 62 years of age and continues through 62 years of age.  Your health care provider may recommend screening at an earlier age if you have risk factors for colon cancer.  Your health care provider may also recommend using home test kits to check for hidden blood in the stool.  A small camera at the end of a tube can be used to examine your colon directly (sigmoidoscopy or colonoscopy). This is done to check for the earliest forms of colorectal  cancer.  Routine screening usually begins at age 50.  Direct examination of the colon should be repeated every 5-10 years through 62 years of age. However, you may need to be screened more often if early forms of precancerous polyps or small growths are found. Skin Cancer  Check your skin from head to toe regularly.  Tell your health care provider about any new moles or changes in moles, especially if there is a change in a mole's shape or color.  Also tell your health care provider if you have a mole that is larger than the size of a pencil eraser.  Always use sunscreen. Apply sunscreen liberally and repeatedly throughout the day.  Protect yourself by wearing long sleeves, pants, a wide-brimmed hat, and sunglasses whenever you are outside. HEART DISEASE, DIABETES, AND HIGH BLOOD PRESSURE   High blood pressure causes heart disease and increases the risk of stroke. High blood pressure is more likely to develop in:  People who have blood pressure in the high end   of the normal range (130-139/85-89 mm Hg).  People who are overweight or obese.  People who are African American.  If you are 38-23 years of age, have your blood pressure checked every 3-5 years. If you are 61 years of age or older, have your blood pressure checked every year. You should have your blood pressure measured twice--once when you are at a hospital or clinic, and once when you are not at a hospital or clinic. Record the average of the two measurements. To check your blood pressure when you are not at a hospital or clinic, you can use:  An automated blood pressure machine at a pharmacy.  A home blood pressure monitor.  If you are between 45 years and 39 years old, ask your health care provider if you should take aspirin to prevent strokes.  Have regular diabetes screenings. This involves taking a blood sample to check your fasting blood sugar level.  If you are at a normal weight and have a low risk for diabetes,  have this test once every three years after 62 years of age.  If you are overweight and have a high risk for diabetes, consider being tested at a younger age or more often. PREVENTING INFECTION  Hepatitis B  If you have a higher risk for hepatitis B, you should be screened for this virus. You are considered at high risk for hepatitis B if:  You were born in a country where hepatitis B is common. Ask your health care provider which countries are considered high risk.  Your parents were born in a high-risk country, and you have not been immunized against hepatitis B (hepatitis B vaccine).  You have HIV or AIDS.  You use needles to inject street drugs.  You live with someone who has hepatitis B.  You have had sex with someone who has hepatitis B.  You get hemodialysis treatment.  You take certain medicines for conditions, including cancer, organ transplantation, and autoimmune conditions. Hepatitis C  Blood testing is recommended for:  Everyone born from [birth year] through 1967.  Anyone with known risk factors for hepatitis C. Sexually transmitted infections (STIs)  You should be screened for sexually transmitted infections (STIs) including gonorrhea and chlamydia if:  You are sexually active and are younger than 62 years of age.  You are older than 62 years of age and your health care provider tells you that you are at risk for this type of infection.  Your sexual activity has changed since you were last screened and you are at an increased risk for chlamydia or gonorrhea. Ask your health care provider if you are at risk.  If you do not have HIV, but are at risk, it may be recommended that you take a prescription medicine daily to prevent HIV infection. This is called pre-exposure prophylaxis (PrEP). You are considered at risk if:  You are sexually active and do not regularly use condoms or know the HIV status of your partner(s).  You take drugs by injection.  You are sexually  active with a partner who has HIV. Talk with your health care provider about whether you are at high risk of being infected with HIV. If you choose to begin PrEP, you should first be tested for HIV. You should then be tested every 3 months for as long as you are taking PrEP.  PREGNANCY   If you are premenopausal and you may become pregnant, ask your health care provider about preconception counseling.  If you may  become pregnant, take 400 to 800 micrograms (mcg) of folic acid every day.  If you want to prevent pregnancy, talk to your health care provider about birth control (contraception). OSTEOPOROSIS AND MENOPAUSE   Osteoporosis is a disease in which the bones lose minerals and strength with aging. This can result in serious bone fractures. Your risk for osteoporosis can be identified using a bone density scan.  If you are 61 years of age or older, or if you are at risk for osteoporosis and fractures, ask your health care provider if you should be screened.  Ask your health care provider whether you should take a calcium or vitamin D supplement to lower your risk for osteoporosis.  Menopause may have certain physical symptoms and risks.  Hormone replacement therapy may reduce some of these symptoms and risks. Talk to your health care provider about whether hormone replacement therapy is right for you.  HOME CARE INSTRUCTIONS   Schedule regular health, dental, and eye exams.  Stay current with your immunizations.   Do not use any tobacco products including cigarettes, chewing tobacco, or electronic cigarettes.  If you are pregnant, do not drink alcohol.  If you are breastfeeding, limit how much and how often you drink alcohol.  Limit alcohol intake to no more than 1 drink per day for nonpregnant women. One drink equals 12 ounces of beer, 5 ounces of wine, or 1 ounces of hard liquor.  Do not use street drugs.  Do not share needles.  Ask your health care provider for help if  you need support or information about quitting drugs.  Tell your health care provider if you often feel depressed.  Tell your health care provider if you have ever been abused or do not feel safe at home.   This information is not intended to replace advice given to you by your health care provider. Make sure you discuss any questions you have with your health care provider.   Document Released: 03/20/2011 Document Revised: 09/25/2014 Document Reviewed: 08/06/2013 Elsevier Interactive Patient Education [employer].

## 2015-12-07 NOTE — Progress Notes (Signed)
Pre visit review using our clinic review tool, if applicable. No additional management support is needed unless otherwise documented below in the visit note. 

## 2015-12-07 NOTE — Progress Notes (Signed)
Subjective:    Patient ID: Kelly [name], female    DOB: 07-06-1956, 60 y.o.   MRN: [id number]  HPI  60YO female presents for physical exam.  Having some episodes of plantar fasciitis. Seen by podiatry. Symptoms improving. Otherwise, feeling well. No exercising as much. Follows a healthy diet.  Wt Readings from Last 3 Encounters:  12/07/15 147 lb 6 oz (66.849 kg)  09/22/14 144 lb 8 oz (65.545 kg)  01/16/14 144 lb (65.318 kg)   BP Readings from Last 3 Encounters:  12/07/15 103/61  09/22/14 96/56  12/04/13 100/70    Past Medical History  Diagnosis Date  . Pancreatitis 2009, 2005  . Claustrophobia   . Shingles   . Shingles outbreak 2006   Family History  Problem Relation Age of Onset  . Colon cancer Mother   . Lymphoma Mother   . Cancer Mother     lymphoma and colon  . Breast cancer Maternal Grandmother   . Coronary artery disease Other   . Heart disease Father 53  . Multiple sclerosis Sister   . Depression Sister   . Diabetes Sister    Past Surgical History  Procedure Laterality Date  . Vaginal delivery  1994  . Cholecystectomy    . Exploratory laparotomy    . Breast biopsy      Nml per pt , f/u mammo 10/2011   Social History   Social History  . Marital Status: Married    Spouse Name: N/A  . Number of Children: 1   . Years of Education: N/A   Occupational History  . Nurse Midwife - [employer]     Social History Main Topics  . Smoking status: Never Smoker   . Smokeless tobacco: Never Used  . Alcohol Use: Yes     Comment: Occasional - 1 - 2 glasses wine a month  . Drug Use: No  . Sexual Activity: Not Asked   Other Topics Concern  . None   Social History Engineer, mining until 1997    Review of Systems  Constitutional: Negative for fever, chills, appetite change, fatigue and unexpected weight change.  Eyes: Negative for visual disturbance.  Respiratory: Negative for cough and shortness of breath.   Cardiovascular: Negative for chest  pain and leg swelling.  Gastrointestinal: Negative for nausea, vomiting, abdominal pain, diarrhea and constipation.  Musculoskeletal: Positive for myalgias and arthralgias.  Skin: Negative for color change and rash.  Hematological: Negative for adenopathy. Does not bruise/bleed easily.  Psychiatric/Behavioral: Negative for sleep disturbance and dysphoric mood. The patient is not nervous/anxious.        Objective:    BP 103/61 mmHg  Pulse 71  Temp(Src) 97.6 F (36.4 C) (Oral)  Ht 5' 2.5" (1.588 m)  Wt 147 lb 6 oz (66.849 kg)  BMI 26.51 kg/m2  SpO2 95% Physical Exam  Constitutional: She is oriented to person, place, and time. She appears well-developed and well-nourished. No distress.  HENT:  Head: Normocephalic and atraumatic.  Right Ear: External ear normal.  Left Ear: External ear normal.  Nose: Nose normal.  Mouth/Throat: Oropharynx is clear and moist. No oropharyngeal exudate.  Eyes: Conjunctivae are normal. Pupils are equal, round, and reactive to light. Right eye exhibits no discharge. Left eye exhibits no discharge. No scleral icterus.  Neck: Normal range of motion. Neck supple. No tracheal deviation present. No thyromegaly present.  Cardiovascular: Normal rate, regular rhythm, normal heart sounds and intact distal pulses.  Exam reveals no gallop and  no friction rub.   No murmur heard. Pulmonary/Chest: Effort normal and breath sounds normal. No accessory muscle usage. No tachypnea. No respiratory distress. She has no decreased breath sounds. She has no wheezes. She has no rales. She exhibits no tenderness. Right breast exhibits no inverted nipple, no mass, no nipple discharge, no skin change and no tenderness. Left breast exhibits no inverted nipple, no mass, no nipple discharge, no skin change and no tenderness. Breasts are symmetrical.  Abdominal: Soft. Bowel sounds are normal. She exhibits no distension and no mass. There is no tenderness. There is no rebound and no guarding.   Musculoskeletal: Normal range of motion. She exhibits no edema or tenderness.  Lymphadenopathy:    She has no cervical adenopathy.  Neurological: She is alert and oriented to person, place, and time. No cranial nerve deficit. She exhibits normal muscle tone. Coordination normal.  Skin: Skin is warm and dry. No rash noted. She is not diaphoretic. No erythema. No pallor.  Psychiatric: She has a normal mood and affect. Her behavior is normal. Judgment and thought content normal.          Assessment & Plan:   Problem List Items Addressed This Visit      Unprioritized   Routine general medical examination at a health care facility - Primary    General medical exam normal today including breast exam. PAP and pelvic deferred as completed by her OB. Labs ordered. Immunizations UTD. Encouraged healthy diet and exercise.      Relevant Orders   CBC with Differential/Platelet   Comprehensive metabolic panel   Lipid panel   VITAMIN D 25 Hydroxy (Vit-D Deficiency, Fractures)   TSH       Return in about 1 year (around 12/06/2016) for Physical.  Ronette Deter, MD [facility]

## 2015-12-16 ENCOUNTER — Other Ambulatory Visit (INDEPENDENT_AMBULATORY_CARE_PROVIDER_SITE_OTHER): Payer: Commercial Managed Care - HMO

## 2015-12-16 DIAGNOSIS — Z0000 Encounter for general adult medical examination without abnormal findings: Secondary | ICD-10-CM

## 2015-12-16 LAB — COMPREHENSIVE METABOLIC PANEL
ALT: 18 U/L (ref 0–35)
AST: 22 U/L (ref 0–37)
Albumin: 4.8 g/dL (ref 3.5–5.2)
Alkaline Phosphatase: 69 U/L (ref 39–117)
BUN: 19 mg/dL (ref 6–23)
CO2: 28 mEq/L (ref 19–32)
Calcium: 10.1 mg/dL (ref 8.4–10.5)
Chloride: 101 mEq/L (ref 96–112)
Creatinine, Ser: 0.69 mg/dL (ref 0.40–1.20)
GFR: 91.56 mL/min (ref >60.00)
Glucose, Bld: 90 mg/dL (ref 70–99)
Potassium: 3.8 mEq/L (ref 3.5–5.1)
Sodium: 138 mEq/L (ref 135–145)
Total Bilirubin: 1 mg/dL (ref 0.2–1.2)
Total Protein: 7.7 g/dL (ref 6.0–8.3)

## 2015-12-16 LAB — LIPID PANEL
Cholesterol: 289 mg/dL — ABNORMAL HIGH (ref 0–200)
HDL: 94 mg/dL (ref >39.00)
LDL Cholesterol: 182 mg/dL — ABNORMAL HIGH (ref 0–99)
NonHDL: 195.36
Total CHOL/HDL Ratio: 3
Triglycerides: 69 mg/dL (ref 0.0–149.0)
VLDL: 13.8 mg/dL (ref 0.0–40.0)

## 2015-12-16 LAB — CBC WITH DIFFERENTIAL/PLATELET
Basophils Absolute: 0 10*3/uL (ref 0.0–0.1)
Basophils Relative: 0.5 % (ref 0.0–3.0)
Eosinophils Absolute: 0.1 10*3/uL (ref 0.0–0.7)
Eosinophils Relative: 1.5 % (ref 0.0–5.0)
HCT: 41.8 % (ref 36.0–46.0)
Hemoglobin: 14.1 g/dL (ref 12.0–15.0)
Lymphocytes Relative: 38.9 % (ref 12.0–46.0)
Lymphs Abs: 2.2 10*3/uL (ref 0.7–4.0)
MCHC: 33.9 g/dL (ref 30.0–36.0)
MCV: 95.2 fl (ref 78.0–100.0)
Monocytes Absolute: 0.4 10*3/uL (ref 0.1–1.0)
Monocytes Relative: 6.7 % (ref 3.0–12.0)
Neutro Abs: 2.9 10*3/uL (ref 1.4–7.7)
Neutrophils Relative %: 52.4 % (ref 43.0–77.0)
Platelets: 221 10*3/uL (ref 150.0–400.0)
RBC: 4.39 Mil/uL (ref 3.87–5.11)
RDW: 13.3 % (ref 11.5–15.5)
WBC: 5.6 10*3/uL (ref 4.0–10.5)

## 2015-12-16 LAB — TSH: TSH: 3 u[IU]/mL (ref 0.35–4.50)

## 2015-12-16 LAB — VITAMIN D 25 HYDROXY (VIT D DEFICIENCY, FRACTURES): VITD: 36.01 ng/mL (ref 30.00–100.00)

## 2016-03-13 ENCOUNTER — Other Ambulatory Visit
Admission: RE | Admit: 2016-03-13 | Discharge: 2016-03-13 | Disposition: A | Discharge status: Home / Self Care | Payer: Commercial Managed Care - HMO | Source: Ambulatory Visit | Attending: Obstetrics & Gynecology | Admitting: Obstetrics & Gynecology

## 2016-03-13 DIAGNOSIS — R1013 Epigastric pain: Secondary | ICD-10-CM | POA: Insufficient documentation

## 2016-03-13 DIAGNOSIS — R10816 Epigastric abdominal tenderness: Secondary | ICD-10-CM | POA: Diagnosis present

## 2016-03-13 LAB — COMPREHENSIVE METABOLIC PANEL
ALT: 15 U/L (ref 14–54)
AST: 20 U/L (ref 15–41)
Albumin: 4.7 g/dL (ref 3.5–5.0)
Alkaline Phosphatase: 80 U/L (ref 38–126)
Anion gap: 8 (ref 5–15)
BUN: 10 mg/dL (ref 6–20)
CO2: 26 mmol/L (ref 22–32)
Calcium: 9.3 mg/dL (ref 8.9–10.3)
Chloride: 103 mmol/L (ref 101–111)
Creatinine, Ser: 0.57 mg/dL (ref 0.44–1.00)
GFR calc Af Amer: >60 mL/min (ref >60)
GFR calc non Af Amer: >60 mL/min (ref >60)
Glucose, Bld: 110 mg/dL — ABNORMAL HIGH (ref 65–99)
Potassium: 3.8 mmol/L (ref 3.5–5.1)
Sodium: 137 mmol/L (ref 135–145)
Total Bilirubin: 1.4 mg/dL — ABNORMAL HIGH (ref 0.3–1.2)
Total Protein: 7.8 g/dL (ref 6.5–8.1)

## 2016-03-13 LAB — CBC WITH DIFFERENTIAL/PLATELET
Basophils Absolute: 0 10*3/uL (ref 0–0.1)
Basophils Relative: 0 %
Eosinophils Absolute: 0.1 10*3/uL (ref 0–0.7)
Eosinophils Relative: 2 %
HCT: 41.4 % (ref 35.0–47.0)
Hemoglobin: 14.7 g/dL (ref 12.0–16.0)
Lymphocytes Relative: 15 %
Lymphs Abs: 1.1 10*3/uL (ref 1.0–3.6)
MCH: 33.4 pg (ref 26.0–34.0)
MCHC: 35.5 g/dL (ref 32.0–36.0)
MCV: 94.2 fL (ref 80.0–100.0)
Monocytes Absolute: 0.4 10*3/uL (ref 0.2–0.9)
Monocytes Relative: 5 %
Neutro Abs: 6.1 10*3/uL (ref 1.4–6.5)
Neutrophils Relative %: 78 %
Platelets: 194 10*3/uL (ref 150–440)
RBC: 4.4 MIL/uL (ref 3.80–5.20)
RDW: 12.8 % (ref 11.5–14.5)
WBC: 7.8 10*3/uL (ref 3.6–11.0)

## 2016-03-13 LAB — LIPASE, BLOOD: Lipase: 3592 U/L — ABNORMAL HIGH (ref 11–51)

## 2016-03-15 ENCOUNTER — Other Ambulatory Visit: Payer: Self-pay | Admitting: Gastroenterology

## 2016-03-15 DIAGNOSIS — R1013 Epigastric pain: Secondary | ICD-10-CM

## 2016-03-15 DIAGNOSIS — R748 Abnormal levels of other serum enzymes: Secondary | ICD-10-CM

## 2016-04-05 ENCOUNTER — Ambulatory Visit

## 2016-04-20 ENCOUNTER — Ambulatory Visit: Admission: RE | Admit: 2016-04-20 | Source: Ambulatory Visit

## 2016-05-01 ENCOUNTER — Encounter: Admission: RE | Payer: Self-pay | Source: Ambulatory Visit

## 2016-05-01 ENCOUNTER — Ambulatory Visit: Admission: RE | Admit: 2016-05-01 | Source: Ambulatory Visit | Admitting: Gastroenterology

## 2016-05-01 SURGERY — ESOPHAGOGASTRODUODENOSCOPY (EGD) WITH PROPOFOL
Anesthesia: General

## 2016-05-29 DIAGNOSIS — R1013 Epigastric pain: Secondary | ICD-10-CM | POA: Diagnosis not present

## 2016-05-29 DIAGNOSIS — K859 Acute pancreatitis without necrosis or infection, unspecified: Secondary | ICD-10-CM | POA: Diagnosis not present

## 2016-06-05 ENCOUNTER — Other Ambulatory Visit: Payer: Self-pay | Admitting: Gastroenterology

## 2016-06-05 DIAGNOSIS — R748 Abnormal levels of other serum enzymes: Secondary | ICD-10-CM

## 2016-06-05 DIAGNOSIS — R1013 Epigastric pain: Secondary | ICD-10-CM

## 2016-06-13 ENCOUNTER — Ambulatory Visit
Admission: RE | Admit: 2016-06-13 | Discharge: 2016-06-13 | Disposition: A | Discharge status: Home / Self Care | Payer: 59 | Source: Ambulatory Visit | Attending: Gastroenterology | Admitting: Gastroenterology

## 2016-06-13 ENCOUNTER — Other Ambulatory Visit: Payer: Self-pay | Admitting: Gastroenterology

## 2016-06-13 DIAGNOSIS — R748 Abnormal levels of other serum enzymes: Secondary | ICD-10-CM

## 2016-06-13 DIAGNOSIS — R1013 Epigastric pain: Secondary | ICD-10-CM

## 2016-06-13 DIAGNOSIS — K859 Acute pancreatitis without necrosis or infection, unspecified: Secondary | ICD-10-CM | POA: Diagnosis not present

## 2016-07-11 ENCOUNTER — Ambulatory Visit
Admission: RE | Admit: 2016-07-11 | Discharge: 2016-07-11 | Disposition: A | Discharge status: Home / Self Care | Payer: 59 | Source: Ambulatory Visit | Attending: Gastroenterology | Admitting: Gastroenterology

## 2016-07-11 DIAGNOSIS — R1013 Epigastric pain: Secondary | ICD-10-CM | POA: Diagnosis not present

## 2016-07-11 DIAGNOSIS — R935 Abnormal findings on diagnostic imaging of other abdominal regions, including retroperitoneum: Secondary | ICD-10-CM | POA: Diagnosis not present

## 2016-07-11 DIAGNOSIS — Z9049 Acquired absence of other specified parts of digestive tract: Secondary | ICD-10-CM | POA: Diagnosis not present

## 2016-07-11 DIAGNOSIS — K859 Acute pancreatitis without necrosis or infection, unspecified: Secondary | ICD-10-CM | POA: Diagnosis not present

## 2016-07-11 DIAGNOSIS — R748 Abnormal levels of other serum enzymes: Secondary | ICD-10-CM

## 2016-07-11 LAB — POCT I-STAT CREATININE: Creatinine, Ser: 0.6 mg/dL (ref 0.44–1.00)

## 2016-07-11 MED ORDER — GADOBENATE DIMEGLUMINE 529 MG/ML IV SOLN
15.0000 mL | Freq: Once | INTRAVENOUS | Status: AC | PRN
  Administered 2016-07-11: 13 mL via INTRAVENOUS

## 2016-08-15 DIAGNOSIS — M65332 Trigger finger, left middle finger: Secondary | ICD-10-CM | POA: Diagnosis not present

## 2016-08-15 DIAGNOSIS — M24542 Contracture, left hand: Secondary | ICD-10-CM | POA: Diagnosis not present

## 2016-10-16 DIAGNOSIS — R300 Dysuria: Secondary | ICD-10-CM | POA: Diagnosis not present

## 2016-10-19 ENCOUNTER — Encounter: Payer: Self-pay | Admitting: Family

## 2016-10-19 ENCOUNTER — Ambulatory Visit (INDEPENDENT_AMBULATORY_CARE_PROVIDER_SITE_OTHER): Payer: 59 | Admitting: Cardiology

## 2016-10-19 ENCOUNTER — Encounter: Payer: Self-pay | Admitting: Cardiology

## 2016-10-19 ENCOUNTER — Ambulatory Visit (INDEPENDENT_AMBULATORY_CARE_PROVIDER_SITE_OTHER): Payer: 59 | Admitting: Family

## 2016-10-19 VITALS — BP 103/72 | HR 74 | Ht 63.0 in | Wt 144.5 lb

## 2016-10-19 VITALS — BP 114/60 | HR 86 | Temp 98.4°F | Ht 62.5 in | Wt 145.2 lb

## 2016-10-19 DIAGNOSIS — K861 Other chronic pancreatitis: Secondary | ICD-10-CM | POA: Diagnosis not present

## 2016-10-19 DIAGNOSIS — R9431 Abnormal electrocardiogram [ECG] [EKG]: Secondary | ICD-10-CM

## 2016-10-19 DIAGNOSIS — R002 Palpitations: Secondary | ICD-10-CM

## 2016-10-19 DIAGNOSIS — I493 Ventricular premature depolarization: Secondary | ICD-10-CM | POA: Diagnosis not present

## 2016-10-19 LAB — CBC WITH DIFFERENTIAL/PLATELET
Basophils Absolute: 0 10*3/uL (ref 0.0–0.1)
Basophils Relative: 0.4 % (ref 0.0–3.0)
Eosinophils Absolute: 0 10*3/uL (ref 0.0–0.7)
Eosinophils Relative: 0.7 % (ref 0.0–5.0)
HCT: 41.3 % (ref 36.0–46.0)
Hemoglobin: 14.1 g/dL (ref 12.0–15.0)
Lymphocytes Relative: 17.5 % (ref 12.0–46.0)
Lymphs Abs: 1.2 10*3/uL (ref 0.7–4.0)
MCHC: 34.1 g/dL (ref 30.0–36.0)
MCV: 96.7 fl (ref 78.0–100.0)
Monocytes Absolute: 0.6 10*3/uL (ref 0.1–1.0)
Monocytes Relative: 8.7 % (ref 3.0–12.0)
Neutro Abs: 5.2 10*3/uL (ref 1.4–7.7)
Neutrophils Relative %: 72.7 % (ref 43.0–77.0)
Platelets: 204 10*3/uL (ref 150.0–400.0)
RBC: 4.28 Mil/uL (ref 3.87–5.11)
RDW: 13.5 % (ref 11.5–15.5)
WBC: 7.1 10*3/uL (ref 4.0–10.5)

## 2016-10-19 LAB — COMPREHENSIVE METABOLIC PANEL
ALT: 13 U/L (ref 0–35)
AST: 10 U/L (ref 0–37)
Albumin: 4.5 g/dL (ref 3.5–5.2)
Alkaline Phosphatase: 71 U/L (ref 39–117)
BUN: 20 mg/dL (ref 6–23)
CO2: 31 mEq/L (ref 19–32)
Calcium: 9.3 mg/dL (ref 8.4–10.5)
Chloride: 103 mEq/L (ref 96–112)
Creatinine, Ser: 0.64 mg/dL (ref 0.40–1.20)
GFR: 99.59 mL/min (ref >60.00)
Glucose, Bld: 102 mg/dL — ABNORMAL HIGH (ref 70–99)
Potassium: 4 mEq/L (ref 3.5–5.1)
Sodium: 141 mEq/L (ref 135–145)
Total Bilirubin: 1 mg/dL (ref 0.2–1.2)
Total Protein: 7.2 g/dL (ref 6.0–8.3)

## 2016-10-19 LAB — LIPASE: Lipase: 23 U/L (ref 11.0–59.0)

## 2016-10-19 LAB — TSH: TSH: 1.63 u[IU]/mL (ref 0.35–4.50)

## 2016-10-19 LAB — MAGNESIUM: Magnesium: 2 mg/dL (ref 1.5–2.5)

## 2016-10-19 NOTE — Assessment & Plan Note (Addendum)
No chest pain. Family h/o SCD. On auscultation, I also appreciated an occasional extra beat. Reassured by normal ekg. NSR. No ischemia. PVC noted. Compared to prior EKG 2009, no significant changes. Pending TSH, Mag, CMP, CBC to look for underlying causes. Will stop coffee and try to eat more regular meals to see if improves. Close vigilance. Pending cardiology consult.

## 2016-10-19 NOTE — Progress Notes (Signed)
Pre visit review using our clinic review tool, if applicable. No additional management support is needed unless otherwise documented below in the visit note. Pre visit review using our clinic review tool, if applicable. No additional management support is needed unless otherwise documented below in the visit note. 

## 2016-10-19 NOTE — Assessment & Plan Note (Addendum)
Recurrent, Follows with GI. No symptoms today however Lipase to ensure.

## 2016-10-19 NOTE — Patient Instructions (Addendum)
Labs  Referral to cardiology  Trial stopping all caffeine; plenty of fluids  Let me know if palpitations increase, worsen or new symptoms develop. As discussed, have a low threhold for going to ED as we are unsure what is causing your palpitations

## 2016-10-19 NOTE — Progress Notes (Signed)
Cardiology Office Note   Date:  10/19/2016   ID:  [name], DOB [birth year], MRN [name]  Referring Doctor:  Kelly Paris, FNP   Cardiologist:   Kelly Bushy, MD   Reason for consultation:  Chief Complaint  Patient presents with  . other    New Patient. Referred by Dr.Arnett today for heart palpitations. Pt c/o current cold. Reviewed meds with pt verbally.      History of Present Illness: Kelly [name] is a 61 y.o. female who presents for Palpitations. She's had these in the past. Ongoing for about a month now, increased frequency in the last week or so. Feels irregularity in her heart beat, and a thumping sensation in her heart. No chest pain. No shortness of breath. She was to resume above without any problems. She reports taking DayQuil and NyQuil but only one or 2 times and stopped because she noted more palpitations. No significant increase in caffeine and she said she tried to wean it off. No loss of consciousness.  Patient denies PND, orthopnea, edema, abdominal pain, chest pain, shortness breath.     ROS:  Please see the history of present illness. Aside from mentioned under HPI, all other systems are reviewed and negative.     Past Medical History:  Diagnosis Date  . Claustrophobia   . Pancreatitis 2009, 2005  . Shingles   . Shingles outbreak 2006    Past Surgical History:  Procedure Laterality Date  . BREAST BIOPSY     Nml per pt , f/u mammo 10/2011  . CHOLECYSTECTOMY    . EXPLORATORY LAPAROTOMY    . VAGINAL DELIVERY  1994     reports that she has never smoked. She has never used smokeless tobacco. She reports that she drinks alcohol. She reports that she does not use drugs.   family history includes Breast cancer in her maternal grandmother; Cancer in her mother; Colon cancer in her mother; Coronary artery disease in her other; Depression in her sister; Diabetes in her sister; Heart attack in her other; Heart disease (age of onset:  38) in her father; Lymphoma in her mother; Multiple sclerosis in her sister; Sudden Cardiac Death in her other.   Outpatient Medications Prior to Visit  Medication Sig Dispense Refill  . cholecalciferol (VITAMIN D) 1000 UNITS tablet Take 1,000 Units by mouth daily.    . lansoprazole (PREVACID) 15 MG capsule Take by mouth.     No facility-administered medications prior to visit.      Allergies: Other; Pantoprazole sodium; and Prednisolone    PHYSICAL EXAM: VS:  BP 103/72 (BP Location: Right Arm, Patient Position: Sitting, Cuff Size: Normal)   Pulse 74   Ht 5\' 3"  (1.6 m)   Wt 144 lb 8 oz (65.5 kg)   BMI 25.60 kg/m  , Body mass index is 25.6 kg/m. Wt Readings from Last 3 Encounters:  10/19/16 144 lb 8 oz (65.5 kg)  10/19/16 145 lb 3.2 oz (65.9 kg)  12/07/15 147 lb 6 oz (66.8 kg)    GENERAL:  well developed, well nourished, not in acute distress HEENT: normocephalic, pink conjunctivae, anicteric sclerae, no xanthelasma, normal dentition, oropharynx clear NECK:  no neck vein engorgement, JVP normal, no hepatojugular reflux, carotid upstroke brisk and symmetric, no bruit, no thyromegaly, no lymphadenopathy LUNGS:  good respiratory effort, clear to auscultation bilaterally CV:  PMI not displaced, no thrills, no lifts, S1 and S2 within normal limits, no palpable S3 or S4, no murmurs, no rubs,  no gallops ABD:  Soft, nontender, nondistended, normoactive bowel sounds, no abdominal aortic bruit, no hepatomegaly, no splenomegaly MS: nontender back, no kyphosis, no scoliosis, no joint deformities EXT:  2+ DP/PT pulses, no edema, no varicosities, no cyanosis, no clubbing SKIN: warm, nondiaphoretic, normal turgor, no ulcers NEUROPSYCH: alert, oriented to person, place, and time, sensory/motor grossly intact, normal mood, appropriate affect  Recent Labs: 10/19/2016: ALT 13; BUN 20; Creatinine, Ser 0.64; Hemoglobin 14.1; Magnesium 2.0; Platelets 204.0; Potassium 4.0; Sodium 141; TSH 1.63   Lipid  Panel    Component Value Date/Time   CHOL 289 (H) 12/16/2015 0949   TRIG 69.0 12/16/2015 0949   HDL 94.00 12/16/2015 0949   CHOLHDL 3 12/16/2015 0949   VLDL 13.8 12/16/2015 0949   LDLCALC 182 (H) 12/16/2015 0949   LDLDIRECT 151.3 10/19/2011 1035     Other studies Reviewed:  EKG:  The ekg from02/09/2016 was personally reviewed by me and it revealed sinus rhythm, 79 BPM. PVC. Abnormal EKG  Additional studies/ records that were reviewed personally reviewed by me today include: None available   ASSESSMENT AND PLAN: Palpitations Likely PVCs/abnormal EKG Recommend evaluation with Holter to determine PVC gradient. Recommend evaluation with echocardiogram. Agree with avoidance of caffeine and over-the-counter decongestants. She is also under more stress lately as patient has reported. His could be contributing to her symptoms.  Current medicines are reviewed at length with the patient today.  The patient does not have concerns regarding medicines.  Labs/ tests ordered today include:  Orders Placed This Encounter  Procedures  . Holter monitor - 24 hour  . ECHOCARDIOGRAM COMPLETE    I had a lengthy and detailed discussion with the patient regarding diagnoses, prognosis, diagnostic options.  I counseled the patient on importance of lifestyle modification including heart healthy diet, regular physical activity .   Disposition:   FU with undersigned after tests   Thank you for this consultation. We will forwarding this consultation to referring physician.   Signed, Kelly Bushy, MD  10/19/2016 5:00 PM    [place]  This note was generated in part with voice recognition software and I apologize for any typographical errors that were not detected and corrected.

## 2016-10-19 NOTE — Progress Notes (Signed)
Subjective:    Patient ID: Kelly [name], female    DOB: 1956/01/09, 61 y.o.   MRN: [id number]  Kelly [name] is a 61 y.o. female who presents today for an acute visit.    HPI: CC: palpitations x 3 weeks, intermittent. Episodes not a/w chest pain, SOB, pain in left arm, diaphoresis. Self  Limiting and last a few seconds however over the past week with cold, noticed them to be more frequent.   Describes an episode 3 weeks ago where heart fluttered and she felt lightheaded. Weird feeling in chest.' 'No pain.'  In the years past, noted palpitations from coffee, mainly associated to lots of coffee and taking call.  I cup cofee ( not on call) ; when on call 2-3 cups. One coke in afternoon.  Endorses sinus congestion one week, unchanged, clear discharge.  had one dose nayqul 3 days ago. No cough, fever, SOB, ear or throat pain.   Does zumba without CP.   Cardiology- 12 years ago at [facility]. Had echo, stress test which turned out to be gallstone, which lead to pancreatitis.   Follows with Dr Kelly [name] for recurrent pancreatitis.Marland Kitchen MRCP 06/2016. On prevacid for duodenitis which suspect is causing pancreatitis flares. No abdominal pain, N, V   CMN     HISTORY:  Past Medical History:  Diagnosis Date  . Claustrophobia   . Pancreatitis 2009, 2005  . Shingles   . Shingles outbreak 2006   Past Surgical History:  Procedure Laterality Date  . BREAST BIOPSY     Nml per pt , f/u mammo 10/2011  . CHOLECYSTECTOMY    . EXPLORATORY LAPAROTOMY    . VAGINAL DELIVERY  1994   Family History  Problem Relation Age of Onset  . Colon cancer Mother   . Lymphoma Mother   . Cancer Mother     lymphoma and colon  . Heart disease Father 17  . Multiple sclerosis Sister   . Depression Sister   . Diabetes Sister   . Breast cancer Maternal Grandmother   . Coronary artery disease Other   . Heart attack Other     40's     Allergies: Other; Pantoprazole sodium; and Prednisolone Current  Outpatient Prescriptions on File Prior to Visit  Medication Sig Dispense Refill  . cholecalciferol (VITAMIN D) 1000 UNITS tablet Take 1,000 Units by mouth daily.     No current facility-administered medications on file prior to visit.     Social History  Substance Use Topics  . Smoking status: Never Smoker  . Smokeless tobacco: Never Used  . Alcohol use Yes     Comment: Occasional - 1 - 2 glasses wine a month    Review of Systems  Constitutional: Negative for chills and fever.  HENT: Positive for congestion. Negative for ear pain, sinus pain, sinus pressure and sore throat.   Eyes: Negative for visual disturbance.  Respiratory: Negative for cough, shortness of breath and wheezing.   Cardiovascular: Negative for chest pain and palpitations.  Gastrointestinal: Negative for nausea and vomiting.  Neurological: Negative for headaches.      Objective:    BP 114/60   Pulse 86   Temp 98.4 F (36.9 C) (Oral)   Ht 5' 2.5" (1.588 m)   Wt 145 lb 3.2 oz (65.9 kg)   SpO2 96%   BMI 26.13 kg/m    Physical Exam  Constitutional: She appears well-developed and well-nourished.  HENT:  Head: Normocephalic and atraumatic.  Right Ear: Hearing, tympanic  membrane, external ear and ear canal normal. No drainage, swelling or tenderness. No foreign bodies. Tympanic membrane is not erythematous and not bulging. No middle ear effusion. No decreased hearing is noted.  Left Ear: Hearing, tympanic membrane, external ear and ear canal normal. No drainage, swelling or tenderness. No foreign bodies. Tympanic membrane is not erythematous and not bulging.  No middle ear effusion. No decreased hearing is noted.  Nose: Nose normal. No rhinorrhea. Right sinus exhibits no maxillary sinus tenderness and no frontal sinus tenderness. Left sinus exhibits no maxillary sinus tenderness and no frontal sinus tenderness.  Mouth/Throat: Uvula is midline, oropharynx is clear and moist and mucous membranes are normal. No  oropharyngeal exudate, posterior oropharyngeal edema, posterior oropharyngeal erythema or tonsillar abscesses.  Eyes: Conjunctivae are normal.  Cardiovascular: Normal heart sounds and normal pulses.  An irregularly irregular rhythm present.  No murmur heard. ]  Pulmonary/Chest: Effort normal and breath sounds normal. She has no wheezes. She has no rhonchi. She has no rales.  Lymphadenopathy:       Head (right side): No submental, no submandibular, no tonsillar, no preauricular, no posterior auricular and no occipital adenopathy present.       Head (left side): No submental, no submandibular, no tonsillar, no preauricular, no posterior auricular and no occipital adenopathy present.    She has no cervical adenopathy.  Neurological: She is alert.  Skin: Skin is warm and dry.  Psychiatric: She has a normal mood and affect. Her speech is normal and behavior is normal. Thought content normal.  Vitals reviewed.      Assessment & Plan:    Problem List Items Addressed This Visit      Digestive   Chronic pancreatitis ([place])    Recurrent, Follows with GI. No symptoms today however Lipase to ensure.       Relevant Medications   lansoprazole (PREVACID) 15 MG capsule   Other Relevant Orders   Comprehensive metabolic panel   CBC with Differential/Platelet   Lipase     Other   Heart palpitations - Primary    No chest pain. On auscultation, I also appreciated an occasional extra beat. Reassured by normal ekg. NSR. No ischemia. PVC noted. Compared to prior EKG 2009, no significant changes. Pending TSH, Mag, CMP, CBC to look for underlying causes. Will stop coffee and try to eat more regular meals to see if improves. Close vigilance. Pending cardiology consult.       Relevant Orders   EKG 12-Lead (Completed)   Ambulatory referral to Cardiology   TSH   Magnesium      I am having Kelly [name] maintain her cholecalciferol and lansoprazole.   Meds ordered this encounter  Medications  .  lansoprazole (PREVACID) 15 MG capsule    Sig: Take by mouth.    Return precautions given.   Risks, benefits, and alternatives of the medications and treatment plan prescribed today were discussed, and patient expressed understanding.   Education regarding symptom management and diagnosis given to patient on AVS.  Continue to follow with Mable Paris, FNP for routine health maintenance.   Kelly [name] and I agreed with plan.   Mable Paris, FNP

## 2016-10-19 NOTE — Patient Instructions (Addendum)
Testing/Procedures: Your physician has requested that you have an echocardiogram. Echocardiography is a painless test that uses sound waves to create images of your heart. It provides your doctor with information about the size and shape of your heart and how well your heart's chambers and valves are working. This procedure takes approximately one hour. There are no restrictions for this procedure.  Your physician has recommended that you wear a holter monitor. Holter monitors are medical devices that record the heart's electrical activity. Doctors most often use these monitors to diagnose arrhythmias. Arrhythmias are problems with the speed or rhythm of the heartbeat. The monitor is a small, portable device. You can wear one while you do your normal daily activities. This is usually used to diagnose what is causing palpitations/syncope (passing out).    Follow-Up: Your physician recommends that you schedule a follow-up appointment as needed with Dr. Yvone Neu. We will call you with your results and if needed schedule follow up at that time.   It was a pleasure seeing you today here in the office. Please do not hesitate to give [place] a call back if you have any further questions. [place], BSN     Echocardiogram An echocardiogram, or echocardiography, uses sound waves (ultrasound) to produce an image of your heart. The echocardiogram is simple, painless, obtained within a short period of time, and offers valuable information to your health care provider. The images from an echocardiogram can provide information such as:  Evidence of coronary artery disease (CAD).  Heart size.  Heart muscle function.  Heart valve function.  Aneurysm detection.  Evidence of a past heart attack.  Fluid buildup around the heart.  Heart muscle thickening.  Assess heart valve function. Tell a health care provider about:  Any allergies you have.  All medicines you are taking, including  vitamins, herbs, eye drops, creams, and over-the-counter medicines.  Any problems you or family members have had with anesthetic medicines.  Any blood disorders you have.  Any surgeries you have had.  Any medical conditions you have.  Whether you are pregnant or may be pregnant. What happens before the procedure? No special preparation is needed. Eat and drink normally. What happens during the procedure?  In order to produce an image of your heart, gel will be applied to your chest and a wand-like tool (transducer) will be moved over your chest. The gel will help transmit the sound waves from the transducer. The sound waves will harmlessly bounce off your heart to allow the heart images to be captured in real-time motion. These images will then be recorded.  You may need an IV to receive a medicine that improves the quality of the pictures. What happens after the procedure? You may return to your normal schedule including diet, activities, and medicines, unless your health care provider tells you otherwise. This information is not intended to replace advice given to you by your health care provider. Make sure you discuss any questions you have with your health care provider. Document Released: 09/01/2000 Document Revised: 04/22/2016 Document Reviewed: 05/12/2013 Elsevier Interactive Patient Education  2017 [employer].  Holter Monitoring Introduction A Holter monitor is a small device that is used to detect abnormal heart rhythms. It clips to your clothing and is connected by wires to flat, sticky disks (electrodes) that attach to your chest. It is worn continuously for 24-48 hours. Follow these instructions at home:  Wear your Holter monitor at all times, even while exercising and sleeping, for  as long as directed by your health care provider.  Make sure that the Holter monitor is safely clipped to your clothing or close to your body as recommended by your health care  provider.  Do not get the monitor or wires wet.  Do not put body lotion or moisturizer on your chest.  Keep your skin clean.  Keep a diary of your daily activities, such as walking and doing chores. If you feel that your heartbeat is abnormal or that your heart is fluttering or skipping a beat:  Record what you are doing when it happens.  Record what time of day the symptoms occur.  Return your Holter monitor as directed by your health care provider.  Keep all follow-up visits as directed by your health care provider. This is important. Get help right away if:  You feel lightheaded or you faint.  You have trouble breathing.  You feel pain in your chest, upper arm, or jaw.  You feel sick to your stomach and your skin is pale, cool, or damp.  You heartbeat feels unusual or abnormal. This information is not intended to replace advice given to you by your health care provider. Make sure you discuss any questions you have with your health care provider. Document Released: 06/02/2004 Document Revised: 02/10/2016 Document Reviewed: 04/13/2014  2017 Elsevier

## 2016-11-13 ENCOUNTER — Ambulatory Visit (INDEPENDENT_AMBULATORY_CARE_PROVIDER_SITE_OTHER): Payer: 59

## 2016-11-13 DIAGNOSIS — R002 Palpitations: Secondary | ICD-10-CM | POA: Diagnosis not present

## 2016-11-14 ENCOUNTER — Other Ambulatory Visit

## 2016-11-15 ENCOUNTER — Ambulatory Visit (INDEPENDENT_AMBULATORY_CARE_PROVIDER_SITE_OTHER): Payer: 59

## 2016-11-15 ENCOUNTER — Other Ambulatory Visit: Payer: Self-pay

## 2016-11-15 DIAGNOSIS — R002 Palpitations: Secondary | ICD-10-CM | POA: Diagnosis not present

## 2016-11-16 ENCOUNTER — Telehealth: Payer: Self-pay | Admitting: *Deleted

## 2016-11-16 DIAGNOSIS — I313 Pericardial effusion (noninflammatory): Secondary | ICD-10-CM

## 2016-11-16 DIAGNOSIS — I3139 Other pericardial effusion (noninflammatory): Secondary | ICD-10-CM

## 2016-11-16 NOTE — Telephone Encounter (Signed)
Patient returning my call and reviewed results and recommendations. She verbalized understanding of our conversation, agreement with plan, and had no further questions at this time. Entered order for repeat echocardiogram in 6 months to evaluate the fluid.

## 2016-11-16 NOTE — Telephone Encounter (Signed)
-----   Message from Wende Bushy, MD sent at 11/16/2016  9:12 AM EST ----- Overall LVEF within normal limits. Some leaking of the aortic, mitral and tricuspid valves, but very mild. Trivial pericardial effusion. We will recommend repeat echo limited in 6 months to reevaluate fluid. There is no evidence that it is causing problems though.

## 2016-11-17 ENCOUNTER — Ambulatory Visit
Admission: RE | Admit: 2016-11-17 | Discharge: 2016-11-17 | Disposition: A | Discharge status: Home / Self Care | Payer: 59 | Source: Ambulatory Visit | Attending: Cardiology | Admitting: Cardiology

## 2016-11-17 DIAGNOSIS — R002 Palpitations: Secondary | ICD-10-CM | POA: Diagnosis not present

## 2016-12-07 ENCOUNTER — Encounter: Admitting: Family

## 2016-12-07 ENCOUNTER — Encounter: Admitting: Internal Medicine

## 2017-01-02 DIAGNOSIS — H524 Presbyopia: Secondary | ICD-10-CM | POA: Diagnosis not present

## 2017-01-23 ENCOUNTER — Other Ambulatory Visit: Payer: Self-pay | Admitting: Nurse Practitioner

## 2017-01-23 ENCOUNTER — Other Ambulatory Visit (HOSPITAL_COMMUNITY)
Admission: RE | Admit: 2017-01-23 | Discharge: 2017-01-23 | Disposition: A | Discharge status: Home / Self Care | Payer: 59 | Source: Ambulatory Visit | Attending: Nurse Practitioner | Admitting: Nurse Practitioner

## 2017-01-23 DIAGNOSIS — Z1151 Encounter for screening for human papillomavirus (HPV): Secondary | ICD-10-CM | POA: Diagnosis not present

## 2017-01-23 DIAGNOSIS — Z01419 Encounter for gynecological examination (general) (routine) without abnormal findings: Secondary | ICD-10-CM | POA: Diagnosis not present

## 2017-01-24 LAB — CYTOLOGY - PAP
Diagnosis: NEGATIVE
HPV: NOT DETECTED

## 2017-01-31 ENCOUNTER — Encounter: Payer: Self-pay | Admitting: Family

## 2017-01-31 ENCOUNTER — Ambulatory Visit (INDEPENDENT_AMBULATORY_CARE_PROVIDER_SITE_OTHER): Admitting: Family

## 2017-01-31 VITALS — BP 108/66 | HR 65 | Temp 98.6°F | Ht 63.0 in | Wt 148.4 lb

## 2017-01-31 DIAGNOSIS — Z0001 Encounter for general adult medical examination with abnormal findings: Secondary | ICD-10-CM

## 2017-01-31 DIAGNOSIS — D2271 Melanocytic nevi of right lower limb, including hip: Secondary | ICD-10-CM | POA: Diagnosis not present

## 2017-01-31 DIAGNOSIS — R002 Palpitations: Secondary | ICD-10-CM | POA: Diagnosis not present

## 2017-01-31 DIAGNOSIS — D225 Melanocytic nevi of trunk: Secondary | ICD-10-CM | POA: Diagnosis not present

## 2017-01-31 DIAGNOSIS — L570 Actinic keratosis: Secondary | ICD-10-CM | POA: Diagnosis not present

## 2017-01-31 DIAGNOSIS — Z0000 Encounter for general adult medical examination without abnormal findings: Secondary | ICD-10-CM

## 2017-01-31 DIAGNOSIS — X32XXXA Exposure to sunlight, initial encounter: Secondary | ICD-10-CM | POA: Diagnosis not present

## 2017-01-31 DIAGNOSIS — D2272 Melanocytic nevi of left lower limb, including hip: Secondary | ICD-10-CM | POA: Diagnosis not present

## 2017-01-31 NOTE — Assessment & Plan Note (Addendum)
Patient  declines referral for colonoscopy as she would like to call GI first to see if she is due. She also declines referral for mammography she states she does not need a new order.Declines CBE as recently had with OB GYN.  Up-to-date on immunizations. Lab work ordered today. Advised continued exercise

## 2017-01-31 NOTE — Assessment & Plan Note (Signed)
Continues to have intermittent palpitations. No associated chest pain . Discussed role of caffeine and anxiety. Patient politely declined starting any medication for anxiety at this time. I advised her to stay vigilant with symptom and if it increases in frequency or change in symptom presentation at all, I advised her to call cardiology to have a repeat Holter monitor since she only had a 24-hour Holter monitor. Patient and I felt comfortable with this and she will let me know.

## 2017-01-31 NOTE — Progress Notes (Signed)
Subjective:    Patient ID: Kelly [name], female    DOB: 02-23-1956, 61 y.o.   MRN: [id number]  CC: Kelly [name] is a 61 y.o. female who presents today for physical exam.    HPI: Still complains of palpitations, intermittent. Episode last night which lasted 3 hours. No CP.  Associated with lightheadedness 'only during 3 episodes'. No syncope.   Saw dr Kelly [name] 10/2016. Had 24 holter which showed pvcs, and echo. Has repeat echo due to pericardial effusion seen on echo.   Denies exertional chest pain or pressure, numbness or tingling radiating to left arm or jaw,  frequent headaches, changes in vision, or shortness of breath during episodes.   Caffeine makes it worse.     Colorectal Cancer Screening: due Breast Cancer Screening: Mammogram due; CBE done with OB GYN in [place].  Cervical Cancer Screening: UTD 2018, neg hpv, malignancy Bone Health screening/DEXA for 65+: No increased fracture risk. Defer screening at this time. Lung Cancer Screening: Doesn't have 30 year pack year history and age > 44 years.       Tetanus - utd         Hepatitis C screening - Candidate for, consents HIV Screening- Candidate for, consents Labs: Screening labs today. Exercise: Gets regular exercise, zumba. Alcohol use: occasional Smoking/tobacco use: Nonsmoker.  Regular dental exams: UTD Wears seat belt: Yes. Skin: follows with dermatology; dr Kelly [name].   HISTORY:  Past Medical History:  Diagnosis Date  . Claustrophobia   . Pancreatitis 2009, 2005  . Shingles   . Shingles outbreak 2006    Past Surgical History:  Procedure Laterality Date  . BREAST BIOPSY     Nml per pt , f/u mammo 10/2011  . CHOLECYSTECTOMY    . EXPLORATORY LAPAROTOMY    . VAGINAL DELIVERY  1994   Family History  Problem Relation Age of Onset  . Colon cancer Mother   . Lymphoma Mother   . Cancer Mother        lymphoma and colon  . Heart disease Father 41  . Multiple sclerosis Sister   . Depression Sister   .  Diabetes Sister   . Breast cancer Maternal Grandmother   . Coronary artery disease Other   . Heart attack Other        40's   . Sudden Cardiac Death Other       ALLERGIES: Other; Pantoprazole sodium; Pollen extract; and Prednisolone  Current Outpatient Prescriptions on File Prior to Visit  Medication Sig Dispense Refill  . cholecalciferol (VITAMIN D) 1000 UNITS tablet Take 1,000 Units by mouth daily.    . lansoprazole (PREVACID) 15 MG capsule Take by mouth.    . Pseudoeph-Doxylamine-DM-APAP (DAYQUIL/NYQUIL COLD/FLU RELIEF PO) Take by mouth.     No current facility-administered medications on file prior to visit.     Social History  Substance Use Topics  . Smoking status: Never Smoker  . Smokeless tobacco: Never Used  . Alcohol use Yes     Comment: Occasional - 1 - 2 glasses wine a month    Review of Systems  Constitutional: Negative for chills, fever and unexpected weight change.  HENT: Negative for congestion.   Eyes: Negative for visual disturbance.  Respiratory: Negative for cough.   Cardiovascular: Positive for palpitations. Negative for chest pain and leg swelling.  Gastrointestinal: Negative for nausea and vomiting.  Musculoskeletal: Negative for arthralgias and myalgias.  Skin: Negative for rash.  Neurological: Positive for light-headedness. Negative for dizziness and headaches.  Hematological: Negative for adenopathy.  Psychiatric/Behavioral: Negative for confusion.      Objective:    BP 108/66   Pulse 65   Temp 98.6 F (37 C) (Oral)   Ht 5\' 3"  (1.6 m)   Wt 148 lb 6.4 oz (67.3 kg)   SpO2 98%   BMI 26.29 kg/m   BP Readings from Last 3 Encounters:  01/31/17 108/66  10/19/16 103/72  10/19/16 114/60   Wt Readings from Last 3 Encounters:  01/31/17 148 lb 6.4 oz (67.3 kg)  10/19/16 144 lb 8 oz (65.5 kg)  10/19/16 145 lb 3.2 oz (65.9 kg)    Physical Exam  Constitutional: She appears well-developed and well-nourished.  Eyes: Conjunctivae are normal.    Neck: No thyroid mass and no thyromegaly present.  Cardiovascular: Normal rate, regular rhythm, normal heart sounds and normal pulses.   Pulmonary/Chest: Effort normal and breath sounds normal. She has no wheezes. She has no rhonchi. She has no rales.  Lymphadenopathy:       Head (right side): No submental, no submandibular, no tonsillar, no preauricular, no posterior auricular and no occipital adenopathy present.       Head (left side): No submental, no submandibular, no tonsillar, no preauricular, no posterior auricular and no occipital adenopathy present.    She has no cervical adenopathy.  Neurological: She is alert.  Skin: Skin is warm and dry.  Psychiatric: She has a normal mood and affect. Her speech is normal and behavior is normal. Thought content normal.  Vitals reviewed.      Assessment & Plan:   Problem List Items Addressed This Visit      Other   Routine general medical examination at a health care facility - Primary    Patient  declines referral for colonoscopy as she would like to call GI first to see if she is due. She also declines referral for mammography she states she does not need a new order.Declines CBE as recently had with OB GYN.  Up-to-date on immunizations. Lab work ordered today. Advised continued exercise      Relevant Orders   CBC with Differential/Platelet   Comprehensive metabolic panel   Hemoglobin A1c   Hepatitis C antibody   HIV antibody   Lipid panel   TSH   VITAMIN D 25 Hydroxy (Vit-D Deficiency, Fractures)   Heart palpitations    Continues to have intermittent palpitations. No associated chest pain . Discussed role of caffeine and anxiety. Patient politely declined starting any medication for anxiety at this time. I advised her to stay vigilant with symptom and if it increases in frequency or change in symptom presentation at all, I advised her to call cardiology to have a repeat Holter monitor since she only had a 24-hour Holter monitor.  Patient and I felt comfortable with this and she will let me know.          I am having Kelly [name] maintain her cholecalciferol, lansoprazole, and Pseudoeph-Doxylamine-DM-APAP (DAYQUIL/NYQUIL COLD/FLU RELIEF PO).   No orders of the defined types were placed in this encounter.   Return precautions given.   Risks, benefits, and alternatives of the medications and treatment plan prescribed today were discussed, and patient expressed understanding.   Education regarding symptom management and diagnosis given to patient on AVS.   Continue to follow with Burnard Hawthorne, FNP for routine health maintenance.   Kelly [name] and I agreed with plan.   Mable Paris, FNP

## 2017-01-31 NOTE — Progress Notes (Signed)
Pre visit review using our clinic review tool, if applicable. No additional management support is needed unless otherwise documented below in the visit note. 

## 2017-01-31 NOTE — Patient Instructions (Addendum)
Makes future lab appt.   Let me know if you are due for colonoscopy- last was 2013.  Be sure to schedule your mammogram as discussed  Health Maintenance, Female Adopting a healthy lifestyle and getting preventive care can go a long way to promote health and wellness. Talk with your health care provider about what schedule of regular examinations is right for you. This is a good chance for you to check in with your provider about disease prevention and staying healthy. In between checkups, there are plenty of things you can do on your own. Experts have done a lot of research about which lifestyle changes and preventive measures are most likely to keep you healthy. Ask your health care provider for more information. Weight and diet Eat a healthy diet  Be sure to include plenty of vegetables, fruits, low-fat dairy products, and lean protein.  Do not eat a lot of foods high in solid fats, added sugars, or salt.  Get regular exercise. This is one of the most important things you can do for your health.  Most adults should exercise for at least 150 minutes each week. The exercise should increase your heart rate and make you sweat (moderate-intensity exercise).  Most adults should also do strengthening exercises at least twice a week. This is in addition to the moderate-intensity exercise. Maintain a healthy weight  Body mass index (BMI) is a measurement that can be used to identify possible weight problems. It estimates body fat based on height and weight. Your health care provider can help determine your BMI and help you achieve or maintain a healthy weight.  For females 40 years of age and older:  A BMI below 18.5 is considered underweight.  A BMI of 18.5 to 24.9 is normal.  A BMI of 25 to 29.9 is considered overweight.  A BMI of 30 and above is considered obese. Watch levels of cholesterol and blood lipids  You should start having your blood tested for lipids and cholesterol at 63  years of age, then have this test every 5 years.  You may need to have your cholesterol levels checked more often if:  Your lipid or cholesterol levels are high.  You are older than 63 years of age.  You are at high risk for heart disease. Cancer screening Lung Cancer  Lung cancer screening is recommended for adults 5-89 years old who are at high risk for lung cancer because of a history of smoking.  A yearly low-dose CT scan of the lungs is recommended for people who:  Currently smoke.  Have quit within the past 15 years.  Have at least a 30-pack-year history of smoking. A pack year is smoking an average of one pack of cigarettes a day for 1 year.  Yearly screening should continue until it has been 15 years since you quit.  Yearly screening should stop if you develop a health problem that would prevent you from having lung cancer treatment. Breast Cancer  Practice breast self-awareness. This means understanding how your breasts normally appear and feel.  It also means doing regular breast self-exams. Let your health care provider know about any changes, no matter how small.  If you are in your 20s or 30s, you should have a clinical breast exam (CBE) by a health care provider every 1-3 years as part of a regular health exam.  If you are 29 or older, have a CBE every year. Also consider having a breast X-ray (mammogram) every year.  If  you have a family history of breast cancer, talk to your health care provider about genetic screening.  If you are at high risk for breast cancer, talk to your health care provider about having an MRI and a mammogram every year.  Breast cancer gene (BRCA) assessment is recommended for women who have family members with BRCA-related cancers. BRCA-related cancers include:  Breast.  Ovarian.  Tubal.  Peritoneal cancers.  Results of the assessment will determine the need for genetic counseling and BRCA1 and BRCA2 testing. Cervical Cancer   Your health care provider may recommend that you be screened regularly for cancer of the pelvic organs (ovaries, uterus, and vagina). This screening involves a pelvic examination, including checking for microscopic changes to the surface of your cervix (Pap test). You may be encouraged to have this screening done every 3 years, beginning at age 35.  For women ages 17-65, health care providers may recommend pelvic exams and Pap testing every 3 years, or they may recommend the Pap and pelvic exam, combined with testing for human papilloma virus (HPV), every 5 years. Some types of HPV increase your risk of cervical cancer. Testing for HPV may also be done on women of any age with unclear Pap test results.  Other health care providers may not recommend any screening for nonpregnant women who are considered low risk for pelvic cancer and who do not have symptoms. Ask your health care provider if a screening pelvic exam is right for you.  If you have had past treatment for cervical cancer or a condition that could lead to cancer, you need Pap tests and screening for cancer for at least 20 years after your treatment. If Pap tests have been discontinued, your risk factors (such as having a new sexual partner) need to be reassessed to determine if screening should resume. Some women have medical problems that increase the chance of getting cervical cancer. In these cases, your health care provider may recommend more frequent screening and Pap tests. Colorectal Cancer  This type of cancer can be detected and often prevented.  Routine colorectal cancer screening usually begins at 63 years of age and continues through 63 years of age.  Your health care provider may recommend screening at an earlier age if you have risk factors for colon cancer.  Your health care provider may also recommend using home test kits to check for hidden blood in the stool.  A small camera at the end of a tube can be used to examine  your colon directly (sigmoidoscopy or colonoscopy). This is done to check for the earliest forms of colorectal cancer.  Routine screening usually begins at age 34.  Direct examination of the colon should be repeated every 5-10 years through 63 years of age. However, you may need to be screened more often if early forms of precancerous polyps or small growths are found. Skin Cancer  Check your skin from head to toe regularly.  Tell your health care provider about any new moles or changes in moles, especially if there is a change in a mole's shape or color.  Also tell your health care provider if you have a mole that is larger than the size of a pencil eraser.  Always use sunscreen. Apply sunscreen liberally and repeatedly throughout the day.  Protect yourself by wearing long sleeves, pants, a wide-brimmed hat, and sunglasses whenever you are outside. Heart disease, diabetes, and high blood pressure  High blood pressure causes heart disease and increases the risk of  stroke. High blood pressure is more likely to develop in:  People who have blood pressure in the high end of the normal range (130-139/85-89 mm Hg).  People who are overweight or obese.  People who are African American.  If you are 38-7 years of age, have your blood pressure checked every 3-5 years. If you are 26 years of age or older, have your blood pressure checked every year. You should have your blood pressure measured twice-once when you are at a hospital or clinic, and once when you are not at a hospital or clinic. Record the average of the two measurements. To check your blood pressure when you are not at a hospital or clinic, you can use:  An automated blood pressure machine at a pharmacy.  A home blood pressure monitor.  If you are between 40 years and 56 years old, ask your health care provider if you should take aspirin to prevent strokes.  Have regular diabetes screenings. This involves taking a blood sample  to check your fasting blood sugar level.  If you are at a normal weight and have a low risk for diabetes, have this test once every three years after 63 years of age.  If you are overweight and have a high risk for diabetes, consider being tested at a younger age or more often. Preventing infection Hepatitis B  If you have a higher risk for hepatitis B, you should be screened for this virus. You are considered at high risk for hepatitis B if:  You were born in a country where hepatitis B is common. Ask your health care provider which countries are considered high risk.  Your parents were born in a high-risk country, and you have not been immunized against hepatitis B (hepatitis B vaccine).  You have HIV or AIDS.  You use needles to inject street drugs.  You live with someone who has hepatitis B.  You have had sex with someone who has hepatitis B.  You get hemodialysis treatment.  You take certain medicines for conditions, including cancer, organ transplantation, and autoimmune conditions. Hepatitis C  Blood testing is recommended for:  Everyone born from [birth year] through 1967.  Anyone with known risk factors for hepatitis C. Sexually transmitted infections (STIs)  You should be screened for sexually transmitted infections (STIs) including gonorrhea and chlamydia if:  You are sexually active and are younger than 63 years of age.  You are older than 63 years of age and your health care provider tells you that you are at risk for this type of infection.  Your sexual activity has changed since you were last screened and you are at an increased risk for chlamydia or gonorrhea. Ask your health care provider if you are at risk.  If you do not have HIV, but are at risk, it may be recommended that you take a prescription medicine daily to prevent HIV infection. This is called pre-exposure prophylaxis (PrEP). You are considered at risk if:  You are sexually active and do not regularly  use condoms or know the HIV status of your partner(s).  You take drugs by injection.  You are sexually active with a partner who has HIV. Talk with your health care provider about whether you are at high risk of being infected with HIV. If you choose to begin PrEP, you should first be tested for HIV. You should then be tested every 3 months for as long as you are taking PrEP. Pregnancy  If you are premenopausal  and you may become pregnant, ask your health care provider about preconception counseling.  If you may become pregnant, take 400 to 800 micrograms (mcg) of folic acid every day.  If you want to prevent pregnancy, talk to your health care provider about birth control (contraception). Osteoporosis and menopause  Osteoporosis is a disease in which the bones lose minerals and strength with aging. This can result in serious bone fractures. Your risk for osteoporosis can be identified using a bone density scan.  If you are 12 years of age or older, or if you are at risk for osteoporosis and fractures, ask your health care provider if you should be screened.  Ask your health care provider whether you should take a calcium or vitamin D supplement to lower your risk for osteoporosis.  Menopause may have certain physical symptoms and risks.  Hormone replacement therapy may reduce some of these symptoms and risks. Talk to your health care provider about whether hormone replacement therapy is right for you. Follow these instructions at home:  Schedule regular health, dental, and eye exams.  Stay current with your immunizations.  Do not use any tobacco products including cigarettes, chewing tobacco, or electronic cigarettes.  If you are pregnant, do not drink alcohol.  If you are breastfeeding, limit how much and how often you drink alcohol.  Limit alcohol intake to no more than 1 drink per day for nonpregnant women. One drink equals 12 ounces of beer, 5 ounces of wine, or 1 ounces of  hard liquor.  Do not use street drugs.  Do not share needles.  Ask your health care provider for help if you need support or information about quitting drugs.  Tell your health care provider if you often feel depressed.  Tell your health care provider if you have ever been abused or do not feel safe at home. This information is not intended to replace advice given to you by your health care provider. Make sure you discuss any questions you have with your health care provider. Document Released: 03/20/2011 Document Revised: 02/10/2016 Document Reviewed: 06/08/2015 Elsevier Interactive Patient Education  2017 [employer].

## 2017-02-28 ENCOUNTER — Encounter: Payer: Self-pay | Admitting: Family

## 2017-02-28 DIAGNOSIS — Z1231 Encounter for screening mammogram for malignant neoplasm of breast: Secondary | ICD-10-CM | POA: Diagnosis not present

## 2017-03-01 ENCOUNTER — Encounter: Payer: Self-pay | Admitting: Family

## 2017-03-14 ENCOUNTER — Other Ambulatory Visit: Payer: Self-pay | Admitting: Obstetrics and Gynecology

## 2017-03-14 MED ORDER — CEPHALEXIN 500 MG PO CAPS: 500.0000 mg | ORAL_CAPSULE | Freq: Two times a day (BID) | ORAL | 0 refills | Status: AC

## 2017-04-03 ENCOUNTER — Ambulatory Visit (INDEPENDENT_AMBULATORY_CARE_PROVIDER_SITE_OTHER): Payer: 59

## 2017-04-03 ENCOUNTER — Other Ambulatory Visit: Payer: Self-pay

## 2017-04-03 DIAGNOSIS — I3139 Other pericardial effusion (noninflammatory): Secondary | ICD-10-CM

## 2017-04-03 DIAGNOSIS — I313 Pericardial effusion (noninflammatory): Secondary | ICD-10-CM | POA: Diagnosis not present

## 2017-04-03 DIAGNOSIS — Z800 Family history of malignant neoplasm of digestive organs: Secondary | ICD-10-CM | POA: Diagnosis not present

## 2017-08-14 DIAGNOSIS — M65332 Trigger finger, left middle finger: Secondary | ICD-10-CM | POA: Diagnosis not present

## 2017-08-14 DIAGNOSIS — M67442 Ganglion, left hand: Secondary | ICD-10-CM | POA: Diagnosis not present

## 2017-08-14 DIAGNOSIS — M24542 Contracture, left hand: Secondary | ICD-10-CM | POA: Diagnosis not present

## 2017-08-29 ENCOUNTER — Encounter: Payer: Self-pay | Admitting: *Deleted

## 2017-08-30 ENCOUNTER — Encounter
Admission: RE | Disposition: A | Discharge status: Home / Self Care | Payer: Self-pay | Source: Ambulatory Visit | Attending: Gastroenterology

## 2017-08-30 ENCOUNTER — Ambulatory Visit
Admission: RE | Admit: 2017-08-30 | Discharge: 2017-08-30 | Disposition: A | Discharge status: Home / Self Care | Payer: 59 | Source: Ambulatory Visit | Attending: Gastroenterology | Admitting: Gastroenterology

## 2017-08-30 ENCOUNTER — Encounter: Payer: Self-pay | Admitting: Certified Registered Nurse Anesthetist

## 2017-08-30 ENCOUNTER — Ambulatory Visit: Payer: 59 | Admitting: Certified Registered Nurse Anesthetist

## 2017-08-30 DIAGNOSIS — Z87898 Personal history of other specified conditions: Secondary | ICD-10-CM | POA: Insufficient documentation

## 2017-08-30 DIAGNOSIS — Z79899 Other long term (current) drug therapy: Secondary | ICD-10-CM | POA: Diagnosis not present

## 2017-08-30 DIAGNOSIS — Z8719 Personal history of other diseases of the digestive system: Secondary | ICD-10-CM | POA: Insufficient documentation

## 2017-08-30 DIAGNOSIS — D124 Benign neoplasm of descending colon: Secondary | ICD-10-CM | POA: Insufficient documentation

## 2017-08-30 DIAGNOSIS — K219 Gastro-esophageal reflux disease without esophagitis: Secondary | ICD-10-CM | POA: Insufficient documentation

## 2017-08-30 DIAGNOSIS — K6389 Other specified diseases of intestine: Secondary | ICD-10-CM | POA: Diagnosis not present

## 2017-08-30 DIAGNOSIS — Z888 Allergy status to other drugs, medicaments and biological substances status: Secondary | ICD-10-CM | POA: Insufficient documentation

## 2017-08-30 DIAGNOSIS — Z8371 Family history of colonic polyps: Secondary | ICD-10-CM | POA: Diagnosis not present

## 2017-08-30 DIAGNOSIS — Z1211 Encounter for screening for malignant neoplasm of colon: Secondary | ICD-10-CM | POA: Insufficient documentation

## 2017-08-30 DIAGNOSIS — K635 Polyp of colon: Secondary | ICD-10-CM | POA: Diagnosis not present

## 2017-08-30 DIAGNOSIS — F40240 Claustrophobia: Secondary | ICD-10-CM | POA: Insufficient documentation

## 2017-08-30 DIAGNOSIS — F419 Anxiety disorder, unspecified: Secondary | ICD-10-CM | POA: Insufficient documentation

## 2017-08-30 DIAGNOSIS — Z800 Family history of malignant neoplasm of digestive organs: Secondary | ICD-10-CM | POA: Insufficient documentation

## 2017-08-30 HISTORY — PX: COLONOSCOPY WITH PROPOFOL: SHX5780

## 2017-08-30 SURGERY — COLONOSCOPY WITH PROPOFOL
Anesthesia: General

## 2017-08-30 MED ORDER — PROPOFOL 10 MG/ML IV BOLUS
INTRAVENOUS | Status: AC
  Filled 2017-08-30: qty 20

## 2017-08-30 MED ORDER — PHENYLEPHRINE HCL 10 MG/ML IJ SOLN
INTRAMUSCULAR | Status: DC | PRN
  Administered 2017-08-30 (×2): 100 ug via INTRAVENOUS

## 2017-08-30 MED ORDER — LIDOCAINE HCL (CARDIAC) 20 MG/ML IV SOLN
INTRAVENOUS | Status: DC | PRN
  Administered 2017-08-30: 50 mg via INTRAVENOUS

## 2017-08-30 MED ORDER — PROPOFOL 500 MG/50ML IV EMUL
INTRAVENOUS | Status: AC
  Filled 2017-08-30: qty 50

## 2017-08-30 MED ORDER — PROPOFOL 500 MG/50ML IV EMUL
INTRAVENOUS | Status: DC | PRN
  Administered 2017-08-30: 180 ug/kg/min via INTRAVENOUS

## 2017-08-30 MED ORDER — PROPOFOL 10 MG/ML IV BOLUS
INTRAVENOUS | Status: DC | PRN
  Administered 2017-08-30 (×2): 20 mg via INTRAVENOUS
  Administered 2017-08-30: 40 mg via INTRAVENOUS

## 2017-08-30 MED ORDER — SODIUM CHLORIDE 0.9 % IV SOLN: INTRAVENOUS | Status: DC

## 2017-08-30 MED ORDER — SODIUM CHLORIDE 0.9 % IV SOLN
INTRAVENOUS | Status: DC
  Administered 2017-08-30: 1000 mL via INTRAVENOUS

## 2017-08-30 MED ORDER — LIDOCAINE HCL (PF) 2 % IJ SOLN
INTRAMUSCULAR | Status: AC
  Filled 2017-08-30: qty 10

## 2017-08-30 NOTE — H&P (Signed)
Outpatient short stay form Pre-procedure 08/30/2017 10:38 AM Lollie Sails MD  Primary Physician: Dr. Mable Paris  Reason for visit:  Colonoscopy  History of present illness:  Patient is a 61 year old female presenting today as above. There is a family history of colon cancer and a primary relative. She tolerated her prep well. She takes no aspirin or blood thinning agents.    Current Facility-Administered Medications:  .  0.9 %  sodium chloride infusion, , Intravenous, Continuous, Lollie Sails, MD, Last Rate: 20 mL/hr at 08/30/17 0949, 1,000 mL at 08/30/17 0949 .  0.9 %  sodium chloride infusion, , Intravenous, Continuous, Lollie Sails, MD  Facility-Administered Medications Ordered in Other Encounters:  .  propofol (DIPRIVAN) 10 mg/mL bolus/IV push, , , Anesthesia Intra-op, Michelet, Stephanie, CRNA, 40 mg at 08/30/17 1038  Medications Prior to Admission  Medication Sig Dispense Refill Last Dose  . cholecalciferol (VITAMIN D) 1000 UNITS tablet Take 1,000 Units by mouth daily.   Past Week at Unknown time  . lansoprazole (PREVACID) 15 MG capsule Take by mouth.   Past Week at Unknown time  . Pseudoeph-Doxylamine-DM-APAP (DAYQUIL/NYQUIL COLD/FLU RELIEF PO) Take by mouth.   Past Week at Unknown time     Allergies  Allergen Reactions  . Other Other (See Comments)    Hormone replacement-----pancreatitis  . Pantoprazole Sodium Diarrhea  . Pollen Extract   . Prednisolone Other (See Comments)    pancreatitis     Past Medical History:  Diagnosis Date  . Claustrophobia   . Pancreatitis 2009, 2005  . Shingles   . Shingles outbreak 2006    Review of systems:      Physical Exam    Heart and lungs: Regular rate and rhythm without rub or gallop, lungs are bilaterally clear.    HEENT: Normocephalic atraumatic eyes are anicteric    Other:     Pertinant exam for procedure: Soft nontender nondistended bowel sounds positive normoactive.    Planned  proceedures: Colonoscopy and indicated procedures. I have discussed the risks benefits and complications of procedures to include not limited to bleeding, infection, perforation and the risk of sedation and the patient wishes to proceed.    Lollie Sails, MD Gastroenterology 08/30/2017  10:38 AM

## 2017-08-30 NOTE — Anesthesia Preprocedure Evaluation (Signed)
Anesthesia Evaluation  Patient identified by MRN, date of birth, ID band Patient awake    Reviewed: Allergy & Precautions, NPO status , Patient's Chart, lab work & pertinent test results  History of Anesthesia Complications Negative for: history of anesthetic complications  Airway Mallampati: II       Dental   Pulmonary neg sleep apnea, neg COPD,           Cardiovascular (-) hypertension(-) Past MI and (-) CHF (-) dysrhythmias (-) Valvular Problems/Murmurs     Neuro/Psych neg Seizures Anxiety    GI/Hepatic Neg liver ROS, neg GERD  ,Hx of pancreatitis   Endo/Other  neg diabetes  Renal/GU negative Renal ROS     Musculoskeletal   Abdominal   Peds  Hematology   Anesthesia Other Findings   Reproductive/Obstetrics                             Anesthesia Physical Anesthesia Plan  ASA: II  Anesthesia Plan: General   Post-op Pain Management:    Induction:   PONV Risk Score and Plan: 3 and TIVA, Propofol infusion, Treatment may vary due to age or medical condition and Ondansetron  Airway Management Planned: Nasal Cannula  Additional Equipment:   Intra-op Plan:   Post-operative Plan:   Informed Consent: I have reviewed the patients History and Physical, chart, labs and discussed the procedure including the risks, benefits and alternatives for the proposed anesthesia with the patient or authorized representative who has indicated his/her understanding and acceptance.     Plan Discussed with:   Anesthesia Plan Comments:         Anesthesia Quick Evaluation

## 2017-08-30 NOTE — Anesthesia Postprocedure Evaluation (Signed)
Anesthesia Post Note  Patient: Kelly [name]  Procedure(s) Performed: COLONOSCOPY WITH PROPOFOL (N/A )  Patient location during evaluation: Endoscopy Anesthesia Type: General Level of consciousness: awake and alert Pain management: pain level controlled Vital Signs Assessment: post-procedure vital signs reviewed and stable Respiratory status: spontaneous breathing and respiratory function stable Cardiovascular status: stable Anesthetic complications: no     Last Vitals:  Vitals:   08/30/17 1129 08/30/17 1139  BP: (!) 81/36 98/66  Pulse: (!) 57 69  Resp: 16 20  Temp:    SpO2: 100% 97%    Last Pain:  Vitals:   08/30/17 1119  TempSrc: Tympanic                 Sahas Sluka K

## 2017-08-30 NOTE — Transfer of Care (Signed)
Immediate Anesthesia Transfer of Care Note  Patient: ASRA GAMBREL  Procedure(s) Performed: COLONOSCOPY WITH PROPOFOL (N/A )  Patient Location: PACU  Anesthesia Type:General  Level of Consciousness: sedated  Airway & Oxygen Therapy: Patient Spontanous Breathing and Patient connected to nasal cannula oxygen  Post-op Assessment: Report given to RN and Post -op Vital signs reviewed and stable  Post vital signs: Reviewed and stable  Last Vitals:  Vitals:   08/30/17 0935 08/30/17 1119  BP: 108/64 (!) 100/58  Pulse: 66 (!) 55  Resp: 16 14  Temp: (!) 36.3 C (!) 36.3 C  SpO2: 98% 99%    Last Pain:  Vitals:   08/30/17 1119  TempSrc: Tympanic         Complications: No apparent anesthesia complications

## 2017-08-30 NOTE — Anesthesia Procedure Notes (Signed)
Date/Time: 08/30/2017 10:31 AM Performed by: Johnna Acosta, CRNA Pre-anesthesia Checklist: Patient identified, Emergency Drugs available, Suction available, Patient being monitored and Timeout performed Patient Re-evaluated:Patient Re-evaluated prior to induction Oxygen Delivery Method: Nasal cannula Preoxygenation: Pre-oxygenation with 100% oxygen

## 2017-08-30 NOTE — Op Note (Signed)
[facility] Gastroenterology Patient Name: Kelly [name] Procedure Date: 08/30/2017 10:29 AM MRN: [id number] Account #: [id number] Date of Birth: [birth year] Admit Type: Outpatient Age: 61 Room: [facility] ENDO ROOM 1 Gender: Female Note Status: Finalized Procedure:            Colonoscopy Indications:          Family history of colon cancer in a first-degree                        relative Providers:            Lollie Sails, MD Referring MD:         Yvetta Coder. Arnett (Referring MD) Medicines:            Monitored Anesthesia Care Complications:        No immediate complications. Procedure:            Pre-Anesthesia Assessment:                       - ASA Grade Assessment: II - A patient with mild                        systemic disease.                       After obtaining informed consent, the colonoscope was                        passed under direct vision. Throughout the procedure,                        the patient's blood pressure, pulse, and oxygen                        saturations were monitored continuously. The                        Colonoscope was introduced through the anus and                        advanced to the the cecum, identified by appendiceal                        orifice and ileocecal valve. The colonoscopy was                        unusually difficult due to a tortuous colon. Successful                        completion of the procedure was aided by changing the                        patient to a supine position, changing the patient to a                        prone position and using manual pressure. The patient                        tolerated the procedure well. The quality of the bowel  preparation was good. Findings:      A 4 mm polyp was found in the descending colon. The polyp was sessile.       The polyp was removed with a cold snare. Resection and retrieval were       complete.      The sigmoid  colon, descending colon, splenic flexure and transverse       colon were significantly tortuous.      The retroflexed view of the distal rectum and anal verge was normal and       showed no anal or rectal abnormalities.      The digital rectal exam was normal. Impression:           - One 4 mm polyp in the descending colon, removed with                        a cold snare. Resected and retrieved.                       - Tortuous colon.                       - The distal rectum and anal verge are normal on                        retroflexion view. Recommendation:       - Discharge patient to home. 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 All rights reserved. The codes documented in this report are preliminary and upon coder review may  be revised to meet current compliance requirements. Lollie Sails, MD 08/30/2017 11:19:31 AM This report has been signed electronically. Number of Addenda: 0 Note Initiated On: 08/30/2017 10:29 AM Scope Withdrawal Time: 0 hours 8 minutes 35 seconds  Total Procedure Duration: 0 hours 31 minutes 35 seconds       [facility]

## 2017-08-30 NOTE — Anesthesia Post-op Follow-up Note (Signed)
Anesthesia QCDR form completed.        

## 2017-08-31 LAB — SURGICAL PATHOLOGY

## 2017-09-03 ENCOUNTER — Encounter: Payer: Self-pay | Admitting: Gastroenterology

## 2017-09-10 ENCOUNTER — Encounter: Payer: Self-pay | Admitting: Internal Medicine

## 2017-09-10 DIAGNOSIS — D126 Benign neoplasm of colon, unspecified: Secondary | ICD-10-CM | POA: Insufficient documentation

## 2017-10-12 DIAGNOSIS — E7800 Pure hypercholesterolemia, unspecified: Secondary | ICD-10-CM | POA: Diagnosis not present

## 2017-10-12 DIAGNOSIS — F40240 Claustrophobia: Secondary | ICD-10-CM | POA: Diagnosis not present

## 2017-10-12 DIAGNOSIS — M6740 Ganglion, unspecified site: Secondary | ICD-10-CM | POA: Diagnosis not present

## 2017-10-12 DIAGNOSIS — M67442 Ganglion, left hand: Secondary | ICD-10-CM | POA: Diagnosis not present

## 2017-10-12 DIAGNOSIS — M65332 Trigger finger, left middle finger: Secondary | ICD-10-CM | POA: Diagnosis not present

## 2018-02-15 DIAGNOSIS — X32XXXA Exposure to sunlight, initial encounter: Secondary | ICD-10-CM | POA: Diagnosis not present

## 2018-02-15 DIAGNOSIS — D2272 Melanocytic nevi of left lower limb, including hip: Secondary | ICD-10-CM | POA: Diagnosis not present

## 2018-02-15 DIAGNOSIS — D2262 Melanocytic nevi of left upper limb, including shoulder: Secondary | ICD-10-CM | POA: Diagnosis not present

## 2018-02-15 DIAGNOSIS — D225 Melanocytic nevi of trunk: Secondary | ICD-10-CM | POA: Diagnosis not present

## 2018-02-15 DIAGNOSIS — L570 Actinic keratosis: Secondary | ICD-10-CM | POA: Diagnosis not present

## 2018-02-15 DIAGNOSIS — D2271 Melanocytic nevi of right lower limb, including hip: Secondary | ICD-10-CM | POA: Diagnosis not present

## 2018-02-15 DIAGNOSIS — D2261 Melanocytic nevi of right upper limb, including shoulder: Secondary | ICD-10-CM | POA: Diagnosis not present

## 2018-02-18 ENCOUNTER — Ambulatory Visit (INDEPENDENT_AMBULATORY_CARE_PROVIDER_SITE_OTHER): Payer: 59 | Admitting: Family

## 2018-02-18 ENCOUNTER — Encounter: Payer: Self-pay | Admitting: Family

## 2018-02-18 VITALS — BP 108/66 | HR 70 | Temp 98.4°F | Resp 16 | Ht 63.0 in | Wt 156.2 lb

## 2018-02-18 DIAGNOSIS — Z0000 Encounter for general adult medical examination without abnormal findings: Secondary | ICD-10-CM | POA: Diagnosis not present

## 2018-02-18 LAB — CBC WITH DIFFERENTIAL/PLATELET
Basophils Absolute: 0 10*3/uL (ref 0.0–0.1)
Basophils Relative: 0.7 % (ref 0.0–3.0)
Eosinophils Absolute: 0.1 10*3/uL (ref 0.0–0.7)
Eosinophils Relative: 1.7 % (ref 0.0–5.0)
HCT: 41.7 % (ref 36.0–46.0)
Hemoglobin: 14.2 g/dL (ref 12.0–15.0)
Lymphocytes Relative: 37 % (ref 12.0–46.0)
Lymphs Abs: 2 10*3/uL (ref 0.7–4.0)
MCHC: 34 g/dL (ref 30.0–36.0)
MCV: 97.5 fl (ref 78.0–100.0)
Monocytes Absolute: 0.4 10*3/uL (ref 0.1–1.0)
Monocytes Relative: 7.1 % (ref 3.0–12.0)
Neutro Abs: 2.9 10*3/uL (ref 1.4–7.7)
Neutrophils Relative %: 53.5 % (ref 43.0–77.0)
Platelets: 211 10*3/uL (ref 150.0–400.0)
RBC: 4.28 Mil/uL (ref 3.87–5.11)
RDW: 12.9 % (ref 11.5–15.5)
WBC: 5.5 10*3/uL (ref 4.0–10.5)

## 2018-02-18 LAB — COMPREHENSIVE METABOLIC PANEL
ALT: 15 U/L (ref 0–35)
AST: 16 U/L (ref 0–37)
Albumin: 4.6 g/dL (ref 3.5–5.2)
Alkaline Phosphatase: 73 U/L (ref 39–117)
BUN: 16 mg/dL (ref 6–23)
CO2: 28 mEq/L (ref 19–32)
Calcium: 9.8 mg/dL (ref 8.4–10.5)
Chloride: 104 mEq/L (ref 96–112)
Creatinine, Ser: 0.65 mg/dL (ref 0.40–1.20)
GFR: 97.41 mL/min (ref >60.00)
Glucose, Bld: 82 mg/dL (ref 70–99)
Potassium: 3.7 mEq/L (ref 3.5–5.1)
Sodium: 142 mEq/L (ref 135–145)
Total Bilirubin: 0.8 mg/dL (ref 0.2–1.2)
Total Protein: 7.5 g/dL (ref 6.0–8.3)

## 2018-02-18 LAB — LIPID PANEL
Cholesterol: 260 mg/dL — ABNORMAL HIGH (ref 0–200)
HDL: 78.9 mg/dL (ref >39.00)
LDL Cholesterol: 168 mg/dL — ABNORMAL HIGH (ref 0–99)
NonHDL: 180.66
Total CHOL/HDL Ratio: 3
Triglycerides: 61 mg/dL (ref 0.0–149.0)
VLDL: 12.2 mg/dL (ref 0.0–40.0)

## 2018-02-18 LAB — VITAMIN D 25 HYDROXY (VIT D DEFICIENCY, FRACTURES): VITD: 27.85 ng/mL — ABNORMAL LOW (ref 30.00–100.00)

## 2018-02-18 NOTE — Progress Notes (Signed)
Subjective:    Patient ID: Kelly [name], female    DOB: 12-Dec-1955, 62 y.o.   MRN: [id number]  CC: Kelly [name] is a 62 y.o. female who presents today for physical exam.    HPI: Feeling well  No complaints.  Palpitations resolved since last year. No SOB, CP     Colorectal Cancer Screening: UTD , 2018; repeat every 3 years Breast Cancer Screening: Mammogram due; follows with OB GYN in [place] Cervical Cancer Screening: UTD 2018 Bone Health screening/DEXA for 65+: Due; on vitamin D Lung Cancer Screening: Doesn't have 30 year pack year history and age > 69 years. Immunizations       Tetanus - utd    Hepatitis C screening - Candidate for, consents HIV Screening- Candidate for , consents Labs: Screening labs today. Exercise: Gets regular exercise, Boot camp 3 x /week.   Alcohol use: occasional Smoking/tobacco use: Nonsmoker.  Regular dental exams: utd Wears seat belt: Yes. Skin: follows with Dr Evorn Gong  HISTORY:  Past Medical History:  Diagnosis Date  . Claustrophobia   . Pancreatitis 2009, 2005  . Shingles   . Shingles outbreak 2006    Past Surgical History:  Procedure Laterality Date  . BREAST BIOPSY     Nml per pt , f/u mammo 10/2011  . CHOLECYSTECTOMY    . COLONOSCOPY WITH PROPOFOL N/A 08/30/2017   Procedure: COLONOSCOPY WITH PROPOFOL;  Surgeon: Lollie Sails, MD;  Location: [facility] ENDOSCOPY;  Service: Endoscopy;  Laterality: N/A;  . EXPLORATORY LAPAROTOMY    . VAGINAL DELIVERY  1994   Family History  Problem Relation Age of Onset  . Colon cancer Mother 26  . Lymphoma Mother   . Cancer Mother 17       lymphoma and colon  . Heart disease Father 51  . Multiple sclerosis Sister   . Depression Sister   . Diabetes Sister   . Breast cancer Maternal Grandmother   . Coronary artery disease Other   . Heart attack Other        40's   . Sudden Cardiac Death Other       ALLERGIES: Other; Pantoprazole sodium; Pollen extract; and  Prednisolone  Current Outpatient Medications on File Prior to Visit  Medication Sig Dispense Refill  . cholecalciferol (VITAMIN D) 1000 UNITS tablet Take 1,000 Units by mouth daily.     No current facility-administered medications on file prior to visit.     Social History   Tobacco Use  . Smoking status: Never Smoker  . Smokeless tobacco: Never Used  Substance Use Topics  . Alcohol use: Yes    Comment: Occasional - 1 - 2 glasses wine a month  . Drug use: No    Review of Systems  Constitutional: Negative for chills, fever and unexpected weight change.  HENT: Negative for congestion.   Respiratory: Negative for cough and shortness of breath.   Cardiovascular: Negative for chest pain, palpitations and leg swelling.  Gastrointestinal: Negative for nausea and vomiting.  Genitourinary: Negative for pelvic pain.  Musculoskeletal: Negative for arthralgias and myalgias.  Skin: Negative for rash.  Neurological: Negative for headaches.  Hematological: Negative for adenopathy.  Psychiatric/Behavioral: Negative for confusion.      Objective:    BP 108/66 (BP Location: Left Arm, Patient Position: Sitting, Cuff Size: Normal)   Pulse 70   Temp 98.4 F (36.9 C) (Oral)   Resp 16   Ht 5\' 3"  (1.6 m)   Wt 156 lb 4 oz (70.9 kg)  SpO2 97%   BMI 27.68 kg/m   BP Readings from Last 3 Encounters:  02/18/18 108/66  08/30/17 98/66  01/31/17 108/66   Wt Readings from Last 3 Encounters:  02/18/18 156 lb 4 oz (70.9 kg)  08/30/17 148 lb (67.1 kg)  01/31/17 148 lb 6.4 oz (67.3 kg)    Physical Exam  Constitutional: She appears well-developed and well-nourished.  Eyes: Conjunctivae are normal.  Neck: No thyroid mass and no thyromegaly present.  Cardiovascular: Normal rate, regular rhythm, normal heart sounds and normal pulses.  Pulmonary/Chest: Effort normal and breath sounds normal. She has no wheezes. She has no rhonchi. She has no rales. Right breast exhibits no inverted nipple, no  mass, no nipple discharge, no skin change and no tenderness. Left breast exhibits no inverted nipple, no mass, no nipple discharge, no skin change and no tenderness. Breasts are symmetrical.  CBE performed.   Lymphadenopathy:       Head (right side): No submental, no submandibular, no tonsillar, no preauricular, no posterior auricular and no occipital adenopathy present.       Head (left side): No submental, no submandibular, no tonsillar, no preauricular, no posterior auricular and no occipital adenopathy present.    She has no cervical adenopathy.       Right cervical: No superficial cervical, no deep cervical and no posterior cervical adenopathy present.      Left cervical: No superficial cervical, no deep cervical and no posterior cervical adenopathy present.    She has no axillary adenopathy.  Neurological: She is alert.  Skin: Skin is warm and dry.  Psychiatric: She has a normal mood and affect. Her speech is normal and behavior is normal. Thought content normal.  Vitals reviewed.      Assessment & Plan:   Problem List Items Addressed This Visit      Other   Routine general medical examination at a health care facility - Primary    Clinical breast exam performed.  Pelvic exam deferred as Pap is up-to-date patient has no pelvic concerns today.  DEXA, mammogram ordered patient understands schedule.  Screening labs ordered as discussed with patient.      Relevant Orders   Comprehensive metabolic panel   CBC with Differential/Platelet   Hepatitis C antibody   HIV antibody   Lipid panel   VITAMIN D 25 Hydroxy (Vit-D Deficiency, Fractures)   MM 3D SCREEN BREAST BILATERAL   DG Bone Density       I have discontinued Devyne L. Burks's lansoprazole and Pseudoeph-Doxylamine-DM-APAP (DAYQUIL/NYQUIL COLD/FLU RELIEF PO). I am also having her maintain her cholecalciferol.   No orders of the defined types were placed in this encounter.   Return precautions given.   Risks,  benefits, and alternatives of the medications and treatment plan prescribed today were discussed, and patient expressed understanding.   Education regarding symptom management and diagnosis given to patient on AVS.   Continue to follow with Burnard Hawthorne, FNP for routine health maintenance.   Kelly [name] and I agreed with plan.   Mable Paris, FNP

## 2018-02-18 NOTE — Assessment & Plan Note (Signed)
Clinical breast exam performed.  Pelvic exam deferred as Pap is up-to-date patient has no pelvic concerns today.  DEXA, mammogram ordered patient understands schedule.  Screening labs ordered as discussed with patient.

## 2018-02-18 NOTE — Patient Instructions (Addendum)
Labs  Pleasure seeing you Lets confirm when you should repeat colonoscopy  We placed a referral for mammogram this year. I asked that you call one the below locations and schedule this when it is convenient for you.   As discussed, I would like you to ask for 3D mammogram over the traditional 2D mammogram as new evidence suggest 3D is superior.   Please note that NOT all insurance companies cover 3D and you may have to pay a higher copay. You may call your insurance company to further clarify your benefits.   Options for [place], [place]  * [facility] [place] [place] * Note if you ask for 3D mammogram at this location, you must request [place], [place] location*      Health Maintenance for Postmenopausal Women Menopause is a normal process in which your reproductive ability comes to an end. This process happens gradually over a span of months to years, usually between the ages of 33 and 36. Menopause is complete when you have missed 12 consecutive menstrual periods. It is important to talk with your health care provider about some of the most common conditions that affect postmenopausal women, such as heart disease, cancer, and bone loss (osteoporosis). Adopting a healthy lifestyle and getting preventive care can help to promote your health and wellness. Those actions can also lower your chances of developing some of these common conditions. What should I know about menopause? During menopause, you may experience a number of symptoms, such as:  Moderate-to-severe hot flashes.  Night sweats.  Decrease in sex drive.  Mood swings.  Headaches.  Tiredness.  Irritability.  Memory problems.  Insomnia.  Choosing to treat or not to treat menopausal changes is an individual decision that you make with your health  care provider. What should I know about hormone replacement therapy and supplements? Hormone therapy products are effective for treating symptoms that are associated with menopause, such as hot flashes and night sweats. Hormone replacement carries certain risks, especially as you become older. If you are thinking about using estrogen or estrogen with progestin treatments, discuss the benefits and risks with your health care provider. What should I know about heart disease and stroke? Heart disease, heart attack, and stroke become more likely as you age. This may be due, in part, to the hormonal changes that your body experiences during menopause. These can affect how your body processes dietary fats, triglycerides, and cholesterol. Heart attack and stroke are both medical emergencies. There are many things that you can do to help prevent heart disease and stroke:  Have your blood pressure checked at least every 1-2 years. High blood pressure causes heart disease and increases the risk of stroke.  If you are 30-56 years old, ask your health care provider if you should take aspirin to prevent a heart attack or a stroke.  Do not use any tobacco products, including cigarettes, chewing tobacco, or electronic cigarettes. If you need help quitting, ask your health care provider.  It is important to eat a healthy diet and maintain a healthy weight. ? Be sure to include plenty of vegetables, fruits, low-fat dairy products, and lean protein. ? Avoid eating foods that are high in solid fats, added sugars, or salt (sodium).  Get regular exercise. This is one of the most important things that you can do  for your health. ? Try to exercise for at least 150 minutes each week. The type of exercise that you do should increase your heart rate and make you sweat. This is known as moderate-intensity exercise. ? Try to do strengthening exercises at least twice each week. Do these in addition to the moderate-intensity  exercise.  Know your numbers.Ask your health care provider to check your cholesterol and your blood glucose. Continue to have your blood tested as directed by your health care provider.  What should I know about cancer screening? There are several types of cancer. Take the following steps to reduce your risk and to catch any cancer development as early as possible. Breast Cancer  Practice breast self-awareness. ? This means understanding how your breasts normally appear and feel. ? It also means doing regular breast self-exams. Let your health care provider know about any changes, no matter how small.  If you are 85 or older, have a clinician do a breast exam (clinical breast exam or CBE) every year. Depending on your age, family history, and medical history, it may be recommended that you also have a yearly breast X-ray (mammogram).  If you have a family history of breast cancer, talk with your health care provider about genetic screening.  If you are at high risk for breast cancer, talk with your health care provider about having an MRI and a mammogram every year.  Breast cancer (BRCA) gene test is recommended for women who have family members with BRCA-related cancers. Results of the assessment will determine the need for genetic counseling and BRCA1 and for BRCA2 testing. BRCA-related cancers include these types: ? Breast. This occurs in males or females. ? Ovarian. ? Tubal. This may also be called fallopian tube cancer. ? Cancer of the abdominal or pelvic lining (peritoneal cancer). ? Prostate. ? Pancreatic.  Cervical, Uterine, and Ovarian Cancer Your health care provider may recommend that you be screened regularly for cancer of the pelvic organs. These include your ovaries, uterus, and vagina. This screening involves a pelvic exam, which includes checking for microscopic changes to the surface of your cervix (Pap test).  For women ages 21-65, health care providers may recommend a  pelvic exam and a Pap test every three years. For women ages 37-65, they may recommend the Pap test and pelvic exam, combined with testing for human papilloma virus (HPV), every five years. Some types of HPV increase your risk of cervical cancer. Testing for HPV may also be done on women of any age who have unclear Pap test results.  Other health care providers may not recommend any screening for nonpregnant women who are considered low risk for pelvic cancer and have no symptoms. Ask your health care provider if a screening pelvic exam is right for you.  If you have had past treatment for cervical cancer or a condition that could lead to cancer, you need Pap tests and screening for cancer for at least 20 years after your treatment. If Pap tests have been discontinued for you, your risk factors (such as having a new sexual partner) need to be reassessed to determine if you should start having screenings again. Some women have medical problems that increase the chance of getting cervical cancer. In these cases, your health care provider may recommend that you have screening and Pap tests more often.  If you have a family history of uterine cancer or ovarian cancer, talk with your health care provider about genetic screening.  If you have vaginal  bleeding after reaching menopause, tell your health care provider.  There are currently no reliable tests available to screen for ovarian cancer.  Lung Cancer Lung cancer screening is recommended for adults 73-55 years old who are at high risk for lung cancer because of a history of smoking. A yearly low-dose CT scan of the lungs is recommended if you:  Currently smoke.  Have a history of at least 30 pack-years of smoking and you currently smoke or have quit within the past 15 years. A pack-year is smoking an average of one pack of cigarettes per day for one year.  Yearly screening should:  Continue until it has been 15 years since you quit.  Stop if  you develop a health problem that would prevent you from having lung cancer treatment.  Colorectal Cancer  This type of cancer can be detected and can often be prevented.  Routine colorectal cancer screening usually begins at age 44 and continues through age 59.  If you have risk factors for colon cancer, your health care provider may recommend that you be screened at an earlier age.  If you have a family history of colorectal cancer, talk with your health care provider about genetic screening.  Your health care provider may also recommend using home test kits to check for hidden blood in your stool.  A small camera at the end of a tube can be used to examine your colon directly (sigmoidoscopy or colonoscopy). This is done to check for the earliest forms of colorectal cancer.  Direct examination of the colon should be repeated every 5-10 years until age 96. However, if early forms of precancerous polyps or small growths are found or if you have a family history or genetic risk for colorectal cancer, you may need to be screened more often.  Skin Cancer  Check your skin from head to toe regularly.  Monitor any moles. Be sure to tell your health care provider: ? About any new moles or changes in moles, especially if there is a change in a mole's shape or color. ? If you have a mole that is larger than the size of a pencil eraser.  If any of your family members has a history of skin cancer, especially at a young age, talk with your health care provider about genetic screening.  Always use sunscreen. Apply sunscreen liberally and repeatedly throughout the day.  Whenever you are outside, protect yourself by wearing long sleeves, pants, a wide-brimmed hat, and sunglasses.  What should I know about osteoporosis? Osteoporosis is a condition in which bone destruction happens more quickly than new bone creation. After menopause, you may be at an increased risk for osteoporosis. To help prevent  osteoporosis or the bone fractures that can happen because of osteoporosis, the following is recommended:  If you are 52-44 years old, get at least 1,000 mg of calcium and at least 600 mg of vitamin D per day.  If you are older than age 46 but younger than age 85, get at least 1,200 mg of calcium and at least 600 mg of vitamin D per day.  If you are older than age 11, get at least 1,200 mg of calcium and at least 800 mg of vitamin D per day.  Smoking and excessive alcohol intake increase the risk of osteoporosis. Eat foods that are rich in calcium and vitamin D, and do weight-bearing exercises several times each week as directed by your health care provider. What should I know about how  menopause affects my mental health? Depression may occur at any age, but it is more common as you become older. Common symptoms of depression include:  Low or sad mood.  Changes in sleep patterns.  Changes in appetite or eating patterns.  Feeling an overall lack of motivation or enjoyment of activities that you previously enjoyed.  Frequent crying spells.  Talk with your health care provider if you think that you are experiencing depression. What should I know about immunizations? It is important that you get and maintain your immunizations. These include:  Tetanus, diphtheria, and pertussis (Tdap) booster vaccine.  Influenza every year before the flu season begins.  Pneumonia vaccine.  Shingles vaccine.  Your health care provider may also recommend other immunizations. This information is not intended to replace advice given to you by your health care provider. Make sure you discuss any questions you have with your health care provider. Document Released: 10/27/2005 Document Revised: 03/24/2016 Document Reviewed: 06/08/2015 Elsevier Interactive Patient Education  2018 [employer].

## 2018-02-19 ENCOUNTER — Other Ambulatory Visit: Payer: Self-pay | Admitting: Family

## 2018-02-19 LAB — HIV ANTIBODY (ROUTINE TESTING W REFLEX): HIV 1&2 Ab, 4th Generation: NONREACTIVE

## 2018-02-19 LAB — HEPATITIS C ANTIBODY
Hepatitis C Ab: NONREACTIVE
SIGNAL TO CUT-OFF: 0.08 (ref <1.00)

## 2018-02-25 ENCOUNTER — Encounter: Payer: Self-pay | Admitting: Family

## 2018-03-05 DIAGNOSIS — H2511 Age-related nuclear cataract, right eye: Secondary | ICD-10-CM | POA: Diagnosis not present

## 2018-03-05 DIAGNOSIS — Z1231 Encounter for screening mammogram for malignant neoplasm of breast: Secondary | ICD-10-CM | POA: Diagnosis not present

## 2018-03-05 DIAGNOSIS — H35372 Puckering of macula, left eye: Secondary | ICD-10-CM | POA: Diagnosis not present

## 2018-03-05 DIAGNOSIS — R928 Other abnormal and inconclusive findings on diagnostic imaging of breast: Secondary | ICD-10-CM | POA: Diagnosis not present

## 2018-03-14 DIAGNOSIS — Z13820 Encounter for screening for osteoporosis: Secondary | ICD-10-CM | POA: Diagnosis not present

## 2018-03-14 DIAGNOSIS — Z780 Asymptomatic menopausal state: Secondary | ICD-10-CM | POA: Diagnosis not present

## 2018-03-14 DIAGNOSIS — M8588 Other specified disorders of bone density and structure, other site: Secondary | ICD-10-CM | POA: Diagnosis not present

## 2018-03-14 LAB — HM DEXA SCAN

## 2018-03-26 ENCOUNTER — Encounter: Payer: Self-pay | Admitting: Family

## 2018-04-03 DIAGNOSIS — R928 Other abnormal and inconclusive findings on diagnostic imaging of breast: Secondary | ICD-10-CM | POA: Diagnosis not present

## 2018-04-03 DIAGNOSIS — R922 Inconclusive mammogram: Secondary | ICD-10-CM | POA: Diagnosis not present

## 2018-04-03 LAB — HM MAMMOGRAPHY

## 2018-04-10 ENCOUNTER — Telehealth: Payer: Self-pay | Admitting: Family

## 2018-04-10 NOTE — Telephone Encounter (Signed)
Mailed letter °

## 2018-04-10 NOTE — Telephone Encounter (Signed)
Mail letter  Ms Yesli, Vanderhoff you are having a great summer. I wanted to mail this to you as I dont think you have seen the MyChart message.   Your dexa scan shows osteopenia. Your fracture risk for the next 10 years is 0.4% for your hip and 7.3% for major osteoporotic fracture.   Thankfully, your bones look great, and you do not meet guidelines to start therapy.   I know you know all of this however I included my standard education below regarding bone health :)   For post menopausal women, guidelines recommend a diet with 1200 mg of Calcium per day. If you are eating calcium rich foods, you do not need a calcium supplement. The body better absorbs the calcium that you eat over supplementation. If you do supplement, I recommend not supplementing the full 1200 mg/ day as this can lead to increased risk of cardiovascular disease. I recommend Calcium Citrate over the counter, and you may take a total of 600 to 800 mg per day in divided doses with meals for best absorption.   For bone health, you need adequate vitamin D, and I recommend you supplement as it is harder to do so with diet alone. I recommend cholecalciferol 800 units daily. Also, please ensure you are following a diet high in calcium -- research shows better outcomes with dietary sources including kale, yogurt, broccolii, cheese, okra, almonds- to name a few.     Also remember that exercise is a great medicine for maintain and preserve bone health. Advise moderate exercise for 30 minutes , 3 times per week.   Catalina Antigua

## 2018-05-03 ENCOUNTER — Telehealth: Payer: Self-pay | Admitting: Family

## 2018-05-03 ENCOUNTER — Encounter: Payer: Self-pay | Admitting: Family

## 2018-05-03 NOTE — Telephone Encounter (Signed)
Sent mychart

## 2019-01-31 ENCOUNTER — Ambulatory Visit (INDEPENDENT_AMBULATORY_CARE_PROVIDER_SITE_OTHER): Payer: Medicare Other

## 2019-01-31 ENCOUNTER — Other Ambulatory Visit: Payer: Self-pay

## 2019-01-31 DIAGNOSIS — Z23 Encounter for immunization: Secondary | ICD-10-CM

## 2019-01-31 MED ORDER — DIPHTH-ACELL PERTUSSIS-TETANUS 25-58-10 LF-MCG/0.5 IM SUSP
0.5000 mL | Freq: Once | INTRAMUSCULAR | Status: AC
Start: 2019-01-31 — End: 2019-01-31
  Administered 2019-01-31: 0.5 mL via INTRAMUSCULAR

## 2019-01-31 NOTE — Progress Notes (Signed)
Pt requested Tdap which was given IM left deltoid.  NDC# [phone].

## 2019-03-13 ENCOUNTER — Telehealth: Payer: Self-pay | Admitting: Family

## 2019-03-13 DIAGNOSIS — Z0000 Encounter for general adult medical examination without abnormal findings: Secondary | ICD-10-CM

## 2019-03-13 NOTE — Telephone Encounter (Signed)
Pt is wanting to have a mammogram done at [facility]

## 2019-03-14 NOTE — Telephone Encounter (Signed)
Ordered mammogram to [facility] Please advise patient to schedule and let me know of any issues in doing so

## 2019-03-14 NOTE — Telephone Encounter (Signed)
Patient notified

## 2019-04-03 ENCOUNTER — Other Ambulatory Visit: Payer: Self-pay

## 2019-04-07 ENCOUNTER — Encounter: Payer: Self-pay | Admitting: Family

## 2019-04-07 ENCOUNTER — Other Ambulatory Visit: Payer: Self-pay

## 2019-04-07 ENCOUNTER — Ambulatory Visit (INDEPENDENT_AMBULATORY_CARE_PROVIDER_SITE_OTHER): Payer: Medicare Other | Admitting: Family

## 2019-04-07 VITALS — BP 98/62 | HR 72 | Temp 98.4°F | Ht 63.5 in | Wt 149.4 lb

## 2019-04-07 DIAGNOSIS — Z0000 Encounter for general adult medical examination without abnormal findings: Secondary | ICD-10-CM | POA: Diagnosis not present

## 2019-04-07 DIAGNOSIS — Z8639 Personal history of other endocrine, nutritional and metabolic disease: Secondary | ICD-10-CM

## 2019-04-07 DIAGNOSIS — Z23 Encounter for immunization: Secondary | ICD-10-CM | POA: Diagnosis not present

## 2019-04-07 NOTE — Patient Instructions (Signed)
Always a pleasure seeing you!  Stay safe!   Health Maintenance, Female Adopting a healthy lifestyle and getting preventive care are important in promoting health and wellness. Ask your health care provider about:  The right schedule for you to have regular tests and exams.  Things you can do on your own to prevent diseases and keep yourself healthy. What should I know about diet, weight, and exercise? Eat a healthy diet   Eat a diet that includes plenty of vegetables, fruits, low-fat dairy products, and lean protein.  Do not eat a lot of foods that are high in solid fats, added sugars, or sodium. Maintain a healthy weight Body mass index (BMI) is used to identify weight problems. It estimates body fat based on height and weight. Your health care provider can help determine your BMI and help you achieve or maintain a healthy weight. Get regular exercise Get regular exercise. This is one of the most important things you can do for your health. Most adults should:  Exercise for at least 150 minutes each week. The exercise should increase your heart rate and make you sweat (moderate-intensity exercise).  Do strengthening exercises at least twice a week. This is in addition to the moderate-intensity exercise.  Spend less time sitting. Even light physical activity can be beneficial. Watch cholesterol and blood lipids Have your blood tested for lipids and cholesterol at 65 years of age, then have this test every 5 years. Have your cholesterol levels checked more often if:  Your lipid or cholesterol levels are high.  You are older than 65 years of age.  You are at high risk for heart disease. What should I know about cancer screening? Depending on your health history and family history, you may need to have cancer screening at various ages. This may include screening for:  Breast cancer.  Cervical cancer.  Colorectal cancer.  Skin cancer.  Lung cancer. What should I know  about heart disease, diabetes, and high blood pressure? Blood pressure and heart disease  High blood pressure causes heart disease and increases the risk of stroke. This is more likely to develop in people who have high blood pressure readings, are of African descent, or are overweight.  Have your blood pressure checked: ? Every 3-5 years if you are 65-65 years of age. ? Every year if you are 65 years old or older. Diabetes Have regular diabetes screenings. This checks your fasting blood sugar level. Have the screening done:  Once every three years after age 43 if you are at a normal weight and have a low risk for diabetes.  More often and at a younger age if you are overweight or have a high risk for diabetes. What should I know about preventing infection? Hepatitis B If you have a higher risk for hepatitis B, you should be screened for this virus. Talk with your health care provider to find out if you are at risk for hepatitis B infection. Hepatitis C Testing is recommended for:  Everyone born from [birth year] through 1967.  Anyone with known risk factors for hepatitis C. Sexually transmitted infections (STIs)  Get screened for STIs, including gonorrhea and chlamydia, if: ? You are sexually active and are younger than 65 years of age. ? You are older than 65 years of age and your health care provider tells you that you are at risk for this type of infection. ? Your sexual activity has changed since you were last screened, and you are at increased risk  for chlamydia or gonorrhea. Ask your health care provider if you are at risk.  Ask your health care provider about whether you are at high risk for HIV. Your health care provider may recommend a prescription medicine to help prevent HIV infection. If you choose to take medicine to prevent HIV, you should first get tested for HIV. You should then be tested every 3 months for as long as you are taking the medicine. Pregnancy  If you are about  to stop having your period (premenopausal) and you may become pregnant, seek counseling before you get pregnant.  Take 400 to 800 micrograms (mcg) of folic acid every day if you become pregnant.  Ask for birth control (contraception) if you want to prevent pregnancy. Osteoporosis and menopause Osteoporosis is a disease in which the bones lose minerals and strength with aging. This can result in bone fractures. If you are 9 years old or older, or if you are at risk for osteoporosis and fractures, ask your health care provider if you should:  Be screened for bone loss.  Take a calcium or vitamin D supplement to lower your risk of fractures.  Be given hormone replacement therapy (HRT) to treat symptoms of menopause. Follow these instructions at home: Lifestyle  Do not use any products that contain nicotine or tobacco, such as cigarettes, e-cigarettes, and chewing tobacco. If you need help quitting, ask your health care provider.  Do not use street drugs.  Do not share needles.  Ask your health care provider for help if you need support or information about quitting drugs. Alcohol use  Do not drink alcohol if: ? Your health care provider tells you not to drink. ? You are pregnant, may be pregnant, or are planning to become pregnant.  If you drink alcohol: ? Limit how much you use to 0-1 drink a day. ? Limit intake if you are breastfeeding.  Be aware of how much alcohol is in your drink. In the [place] one drink equals one 12 oz bottle of beer (355 mL), one 5 oz glass of wine (148 mL), or one 1 oz glass of hard liquor (44 mL). General instructions  Schedule regular health, dental, and eye exams.  Stay current with your vaccines.  Tell your health care provider if: ? You often feel depressed. ? You have ever been abused or do not feel safe at home. Summary  Adopting a healthy lifestyle and getting preventive care are important in promoting health and wellness.  Follow your  health care provider's instructions about healthy diet, exercising, and getting tested or screened for diseases.  Follow your health care provider's instructions on monitoring your cholesterol and blood pressure. This information is not intended to replace advice given to you by your health care provider. Make sure you discuss any questions you have with your health care provider. Document Released: 03/20/2011 Document Revised: 08/28/2018 Document Reviewed: 08/28/2018 Elsevier Patient Education  2020 [employer].

## 2019-04-07 NOTE — Assessment & Plan Note (Signed)
Clinical breast exam performed today.  Deferred pelvic exam the absence of complaints and she also follows with GYN.  Mammogram is scheduled.

## 2019-04-07 NOTE — Progress Notes (Signed)
Subjective:    Patient ID: Kelly [name], female    DOB: April 12, 1956, 63 y.o.   MRN: [id number]  CC: BAELEIGH DEVINCENT is a 63 y.o. female who presents today for physical exam.    HPI: Patient feels well today, no complaints. Palpitations have resolved.  No chest pain.   Colorectal Cancer Screening: UTD  Breast Cancer Screening: Mammogram scheduled Cervical Cancer Screening: UTD; follows with [facility] Bone Health screening/DEXA for 65+: Done 2019 Lung Cancer Screening: Doesn't have 30 year pack year history and age > 38 years.       Tetanus - utd        Pneumococcal - Candidate for.   Labs: Screening labs today. Exercise: Gets regular exercise.  Personal trainer, elliptical Alcohol use: occassional Smoking/tobacco use: Nonsmoker.  Regular dental exams: UTD Wears seat belt: Yes.  HISTORY:  Past Medical History:  Diagnosis Date  . Claustrophobia   . Pancreatitis 2009, 2005  . Shingles   . Shingles outbreak 2006    Past Surgical History:  Procedure Laterality Date  . BREAST BIOPSY     Nml per pt , f/u mammo 10/2011  . CHOLECYSTECTOMY    . COLONOSCOPY WITH PROPOFOL N/A 08/30/2017   Procedure: COLONOSCOPY WITH PROPOFOL;  Surgeon: Lollie Sails, MD;  Location[facility] ENDOSCOPY;  Service: Endoscopy;  Laterality: N/A;  . EXPLORATORY LAPAROTOMY    . VAGINAL DELIVERY  1994   Family History  Problem Relation Age of Onset  . Colon cancer Mother 81  . Lymphoma Mother   . Cancer Mother 32       lymphoma and colon  . Heart disease Father 51  . Multiple sclerosis Sister   . Depression Sister   . Diabetes Sister   . Breast cancer Maternal Grandmother   . Coronary artery disease Other   . Heart attack Other        40's   . Sudden Cardiac Death Other       ALLERGIES: Other, Pantoprazole sodium, Pollen extract, and Prednisolone  Current Outpatient Medications on File Prior to Visit  Medication Sig Dispense Refill  . cholecalciferol (VITAMIN D) 1000 UNITS  tablet Take 1,000 Units by mouth daily.     No current facility-administered medications on file prior to visit.     Social History   Tobacco Use  . Smoking status: Never Smoker  . Smokeless tobacco: Never Used  Substance Use Topics  . Alcohol use: Yes    Comment: Occasional - 1 - 2 glasses wine a month  . Drug use: No    Review of Systems  Constitutional: Negative for chills, fever and unexpected weight change.  HENT: Negative for congestion.   Respiratory: Negative for cough.   Cardiovascular: Negative for chest pain, palpitations and leg swelling.  Gastrointestinal: Negative for nausea and vomiting.  Musculoskeletal: Negative for arthralgias and myalgias.  Skin: Negative for rash.  Neurological: Negative for headaches.  Hematological: Negative for adenopathy.  Psychiatric/Behavioral: Negative for confusion.      Objective:    BP 98/62   Pulse 72   Temp 98.4 F (36.9 C)   Ht 5' 3.5" (1.613 m)   Wt 149 lb 6.4 oz (67.8 kg)   SpO2 95%   BMI 26.05 kg/m   BP Readings from Last 3 Encounters:  04/07/19 98/62  02/18/18 108/66  08/30/17 98/66   Wt Readings from Last 3 Encounters:  04/07/19 149 lb 6.4 oz (67.8 kg)  02/18/18 156 lb 4 oz (70.9  kg)  08/30/17 148 lb (67.1 kg)    Physical Exam Vitals signs reviewed.  Constitutional:      Appearance: She is well-developed.  Eyes:     Conjunctiva/sclera: Conjunctivae normal.  Neck:     Thyroid: No thyroid mass or thyromegaly.  Cardiovascular:     Rate and Rhythm: Normal rate and regular rhythm.     Pulses: Normal pulses.     Heart sounds: Normal heart sounds.  Pulmonary:     Effort: Pulmonary effort is normal.     Breath sounds: Normal breath sounds. No wheezing, rhonchi or rales.  Chest:     Breasts: Breasts are symmetrical.        Right: No inverted nipple, mass, nipple discharge, skin change or tenderness.        Left: No inverted nipple, mass, nipple discharge, skin change or tenderness.  Lymphadenopathy:      Head:     Right side of head: No submental, submandibular, tonsillar, preauricular, posterior auricular or occipital adenopathy.     Left side of head: No submental, submandibular, tonsillar, preauricular, posterior auricular or occipital adenopathy.     Cervical: No cervical adenopathy.     Right cervical: No superficial, deep or posterior cervical adenopathy.    Left cervical: No superficial, deep or posterior cervical adenopathy.  Skin:    General: Skin is warm and dry.  Neurological:     Mental Status: She is alert.  Psychiatric:        Speech: Speech normal.        Behavior: Behavior normal.        Thought Content: Thought content normal.        Assessment & Plan:   Problem List Items Addressed This Visit      Other   Routine general medical examination at a health care facility - Primary    Clinical breast exam performed today.  Deferred pelvic exam the absence of complaints and she also follows with GYN.  Mammogram is scheduled.       Relevant Orders   TSH   CBC with Differential/Platelet   Comprehensive metabolic panel   Lipid panel   VITAMIN D 25 Hydroxy (Vit-D Deficiency, Fractures)    Other Visit Diagnoses    History of vitamin D deficiency       Relevant Orders   VITAMIN D 25 Hydroxy (Vit-D Deficiency, Fractures)   Need for pneumococcal vaccination       Relevant Orders   Pneumococcal conjugate vaccine 13-valent IM (Completed)       I am having Fatima L. Espericueta maintain her cholecalciferol.   No orders of the defined types were placed in this encounter.   Return precautions given.   Risks, benefits, and alternatives of the medications and treatment plan prescribed today were discussed, and patient expressed understanding.   Education regarding symptom management and diagnosis given to patient on AVS.   Continue to follow with Burnard Hawthorne, FNP for routine health maintenance.   Kelly [name] and I agreed with plan.   Mable Paris, FNP

## 2019-04-08 LAB — LIPID PANEL
Cholesterol: 265 mg/dL — ABNORMAL HIGH (ref 0–200)
HDL: 85.7 mg/dL (ref >39.00)
LDL Cholesterol: 163 mg/dL — ABNORMAL HIGH (ref 0–99)
NonHDL: 179.14
Total CHOL/HDL Ratio: 3
Triglycerides: 79 mg/dL (ref 0.0–149.0)
VLDL: 15.8 mg/dL (ref 0.0–40.0)

## 2019-04-08 LAB — CBC WITH DIFFERENTIAL/PLATELET
Basophils Absolute: 0 10*3/uL (ref 0.0–0.1)
Basophils Relative: 0.8 % (ref 0.0–3.0)
Eosinophils Absolute: 0.1 10*3/uL (ref 0.0–0.7)
Eosinophils Relative: 1.8 % (ref 0.0–5.0)
HCT: 40.9 % (ref 36.0–46.0)
Hemoglobin: 13.7 g/dL (ref 12.0–15.0)
Lymphocytes Relative: 31.5 % (ref 12.0–46.0)
Lymphs Abs: 1.8 10*3/uL (ref 0.7–4.0)
MCHC: 33.4 g/dL (ref 30.0–36.0)
MCV: 100.4 fl — ABNORMAL HIGH (ref 78.0–100.0)
Monocytes Absolute: 0.5 10*3/uL (ref 0.1–1.0)
Monocytes Relative: 9 % (ref 3.0–12.0)
Neutro Abs: 3.3 10*3/uL (ref 1.4–7.7)
Neutrophils Relative %: 56.9 % (ref 43.0–77.0)
Platelets: 203 10*3/uL (ref 150.0–400.0)
RBC: 4.07 Mil/uL (ref 3.87–5.11)
RDW: 13.3 % (ref 11.5–15.5)
WBC: 5.7 10*3/uL (ref 4.0–10.5)

## 2019-04-08 LAB — COMPREHENSIVE METABOLIC PANEL
ALT: 19 U/L (ref 0–35)
AST: 17 U/L (ref 0–37)
Albumin: 4.7 g/dL (ref 3.5–5.2)
Alkaline Phosphatase: 68 U/L (ref 39–117)
BUN: 21 mg/dL (ref 6–23)
CO2: 27 mEq/L (ref 19–32)
Calcium: 9.3 mg/dL (ref 8.4–10.5)
Chloride: 103 mEq/L (ref 96–112)
Creatinine, Ser: 0.66 mg/dL (ref 0.40–1.20)
GFR: 89.73 mL/min (ref >60.00)
Glucose, Bld: 82 mg/dL (ref 70–99)
Potassium: 4.1 mEq/L (ref 3.5–5.1)
Sodium: 141 mEq/L (ref 135–145)
Total Bilirubin: 0.9 mg/dL (ref 0.2–1.2)
Total Protein: 6.9 g/dL (ref 6.0–8.3)

## 2019-04-08 LAB — VITAMIN D 25 HYDROXY (VIT D DEFICIENCY, FRACTURES): VITD: 28.3 ng/mL — ABNORMAL LOW (ref 30.00–100.00)

## 2019-04-08 LAB — TSH: TSH: 2.26 u[IU]/mL (ref 0.35–4.50)

## 2019-05-05 ENCOUNTER — Encounter: Payer: Self-pay | Admitting: Family

## 2019-07-03 ENCOUNTER — Other Ambulatory Visit: Payer: Self-pay

## 2019-07-03 ENCOUNTER — Ambulatory Visit (INDEPENDENT_AMBULATORY_CARE_PROVIDER_SITE_OTHER): Payer: Medicare Other | Admitting: Family Medicine

## 2019-07-03 VITALS — BP 102/76 | HR 71 | Temp 97.0°F | Resp 16 | Ht 63.0 in | Wt 150.0 lb

## 2019-07-03 DIAGNOSIS — I493 Ventricular premature depolarization: Secondary | ICD-10-CM

## 2019-07-03 DIAGNOSIS — R9431 Abnormal electrocardiogram [ECG] [EKG]: Secondary | ICD-10-CM | POA: Diagnosis not present

## 2019-07-03 DIAGNOSIS — I499 Cardiac arrhythmia, unspecified: Secondary | ICD-10-CM | POA: Diagnosis not present

## 2019-07-03 MED ORDER — METOPROLOL TARTRATE 25 MG PO TABS: 25.0000 mg | ORAL_TABLET | Freq: Two times a day (BID) | ORAL | 3 refills | Status: DC | PRN

## 2019-07-03 NOTE — Progress Notes (Signed)
Subjective:    Patient ID: Kelly [name], female    DOB: 1955/12/22, 63 y.o.   MRN: [id number]  HPI   Patient presents to clinic complaining of palpitations for the past 3 weeks.  Patient states she had similar thing happen about 2 and 3 years ago.  Denies of being related to increased caffeine and a flareup of chronic pancreatitis.  Patient does not take medications regularly usually tries to avoid medications.  Has switched to decaf coffee due to caffeine seeming to affect her heart rate.  Denies chest pain, but will feel a fluttering and irregular beat in chest fairly often over the past 3 weeks.  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  On occasion does drink regular coffee when at the hospital and overnight.  Does not take any other sort of cough/cold medicine that contains Sudafed.  Patient Active Problem List   Diagnosis Date Noted  . Tubular adenoma of colon 09/10/2017  . Chronic pancreatitis ([place]) 10/19/2016  . Routine general medical examination at a health care facility 10/19/2011   Social History   Tobacco Use  . Smoking status: Never Smoker  . Smokeless tobacco: Never Used  Substance Use Topics  . Alcohol use: Yes    Comment: Occasional - 1 - 2 glasses wine a month    Review of Systems  Constitutional: Negative for chills, fatigue and fever.  HENT: Negative for congestion, ear pain, sinus pain and sore throat.   Eyes: Negative.   Respiratory: Negative for cough, shortness of breath and wheezing.   Cardiovascular: Negative for chest pain, and leg swelling. +palpitations Gastrointestinal: Negative for abdominal pain, diarrhea, nausea and vomiting.  Genitourinary: Negative for dysuria, frequency and urgency.  Musculoskeletal: Negative for arthralgias and myalgias.  Skin: Negative for color change, pallor and rash.  Neurological: Negative for syncope, light-headedness and headaches.  Psychiatric/Behavioral: The  patient is not nervous/anxious.       Objective:   Physical Exam Vitals signs and nursing note reviewed.  Constitutional:      General: She is not in acute distress.    Appearance: She is not ill-appearing, toxic-appearing or diaphoretic.  HENT:     Head: Normocephalic and atraumatic.  Cardiovascular:     Rate and Rhythm: Normal rate.     Heart sounds: Normal heart sounds. No murmur. No friction rub. No gallop.   Pulmonary:     Effort: Pulmonary effort is normal. No respiratory distress.     Breath sounds: Normal breath sounds.  Musculoskeletal:     Right lower leg: No edema.     Left lower leg: No edema.  Skin:    General: Skin is warm and dry.     Coloration: Skin is not jaundiced or pale.  Neurological:     General: No focal deficit present.     Mental Status: She is alert and oriented to person, place, and time.     Gait: Gait normal.  Psychiatric:        Mood and Affect: Mood normal.        Behavior: Behavior normal.    Today's Vitals   07/03/19 1358  BP: 102/76  Pulse: 71  Resp: 16  Temp: (!) 97 F (36.1 C)  TempSrc: Temporal  SpO2: 95%  Weight: 150 lb (68 kg)  Height: 5\' 3"  (1.6 m)   Body mass index is 26.57 kg/m.    Assessment & Plan:     Irregular  heartbeat/palpitations, ventricular ectopic beats, abnormal EKG- EKG done in clinic and reviewed by me.  Shows a sinus rhythm with frequent ventricular ectopic beats.  Compared to EKG from 2018, did also show a sinus rhythm with occasional ventricular ectopic beats.  We will put in urgent cardiology referral for further work-up.  Discussed adding on a beta-blocker to help better control heart rate, patient would prefer not to have to take an every day and use more of it as needed when feeling palpitations.  She will use metoprolol tartrate twice daily as needed & also discussed option of Toprol-XL if the short acting as needed release metoprolol is not effective.  Patient aware someone will be calling her with  cardiology appointment and if she is not hearing any in the next few days let us know.  Also advised patient that if she begins to have palpitations that are not responding to use of metoprolol, develops chest pain, shortness of breath, feeling faint or dizzy, sweating, pain in jaw down arm she needs to go to emergency room right away for evaluation.  Patient verbalizes understanding.  Patient will follow-up with PCP as regularly scheduled, she will return to clinic at any time if questions or concerns.

## 2019-07-17 ENCOUNTER — Encounter: Payer: Self-pay | Admitting: Internal Medicine

## 2019-07-17 ENCOUNTER — Other Ambulatory Visit: Payer: Self-pay

## 2019-07-17 ENCOUNTER — Ambulatory Visit (INDEPENDENT_AMBULATORY_CARE_PROVIDER_SITE_OTHER): Payer: Medicare Other | Admitting: Internal Medicine

## 2019-07-17 VITALS — BP 100/70 | HR 62 | Ht 63.0 in | Wt 148.0 lb

## 2019-07-17 DIAGNOSIS — R002 Palpitations: Secondary | ICD-10-CM | POA: Diagnosis not present

## 2019-07-17 DIAGNOSIS — I3139 Other pericardial effusion (noninflammatory): Secondary | ICD-10-CM | POA: Insufficient documentation

## 2019-07-17 DIAGNOSIS — I313 Pericardial effusion (noninflammatory): Secondary | ICD-10-CM | POA: Diagnosis not present

## 2019-07-17 NOTE — Patient Instructions (Signed)
Medication Instructions:  Your physician recommends that you continue on your current medications as directed. Please refer to the Current Medication list given to you today.  *If you need a refill on your cardiac medications before your next appointment, please call your pharmacy*  Lab Work: Your physician recommends that you return for lab work in: TODAY - BMP, Mg.  If you have labs (blood work) drawn today and your tests are completely normal, you will receive your results only by: Marland Kitchen MyChart Message (if you have MyChart) OR . A paper copy in the mail If you have any lab test that is abnormal or we need to change your treatment, we will call you to review the results.  Testing/Procedures: Your physician has requested that you have an echocardiogram. Echocardiography is a painless test that uses sound waves to create images of your heart. It provides your doctor with information about the size and shape of your heart and how well your heart's chambers and valves are working. This procedure takes approximately one hour. There are no restrictions for this procedure. You may get an IV, if needed, to receive an ultrasound enhancing agent through to better visualize your heart.    Follow-Up: At [facility], you and your health needs are our priority.  As part of our continuing mission to provide you with exceptional heart care, we have created designated Provider Care Teams.  These Care Teams include your primary Cardiologist (physician) and Advanced Practice Providers (APPs -  Physician Assistants and Nurse Practitioners) who all work together to provide you with the care you need, when you need it.  Your next appointment:   1 MONTH.   The format for your next appointment:   In Person  Provider:    You may see DR Harrell Gave END or one of the following Advanced Practice Providers on your designated Care Team:    Murray Hodgkins, NP  Christell Faith, PA-C  Marrianne Mood, PA-C    Echocardiogram An echocardiogram is a procedure that uses painless sound waves (ultrasound) to produce an image of the heart. Images from an echocardiogram can provide important information about:  Signs of coronary artery disease (CAD).  Aneurysm detection. An aneurysm is a weak or damaged part of an artery wall that bulges out from the normal force of blood pumping through the body.  Heart size and shape. Changes in the size or shape of the heart can be associated with certain conditions, including heart failure, aneurysm, and CAD.  Heart muscle function.  Heart valve function.  Signs of a past heart attack.  Fluid buildup around the heart.  Thickening of the heart muscle.  A tumor or infectious growth around the heart valves. Tell a health care provider about:  Any allergies you have.  All medicines you are taking, including vitamins, herbs, eye drops, creams, and over-the-counter medicines.  Any blood disorders you have.  Any surgeries you have had.  Any medical conditions you have.  Whether you are pregnant or may be pregnant. What are the risks? Generally, this is a safe procedure. However, problems may occur, including:  Allergic reaction to dye (contrast) that may be used during the procedure. What happens before the procedure? No specific preparation is needed. You may eat and drink normally. What happens during the procedure?   An IV tube may be inserted into one of your veins.  You may receive contrast through this tube. A contrast is an injection that improves the quality of the pictures from  your heart.  A gel will be applied to your chest.  A wand-like tool (transducer) will be moved over your chest. The gel will help to transmit the sound waves from the transducer.  The sound waves will harmlessly bounce off of your heart to allow the heart images to be captured in real-time motion. The images will be recorded on a computer. The procedure may vary  among health care providers and hospitals. What happens after the procedure?  You may return to your normal, everyday life, including diet, activities, and medicines, unless your health care provider tells you not to do that. Summary  An echocardiogram is a procedure that uses painless sound waves (ultrasound) to produce an image of the heart.  Images from an echocardiogram can provide important information about the size and shape of your heart, heart muscle function, heart valve function, and fluid buildup around your heart.  You do not need to do anything to prepare before this procedure. You may eat and drink normally.  After the echocardiogram is completed, you may return to your normal, everyday life, unless your health care provider tells you not to do that. This information is not intended to replace advice given to you by your health care provider. Make sure you discuss any questions you have with your health care provider. Document Released: 09/01/2000 Document Revised: 12/26/2018 Document Reviewed: 10/07/2016 Elsevier Patient Education  2020 [employer].

## 2019-07-17 NOTE — Progress Notes (Signed)
Follow-up Outpatient Visit Date: 07/17/2019  Primary Care Provider: Burnard Hawthorne, FNP [address] [place] [address]  Chief Complaint: Palpitations  HPI:  Kelly [name] is a 63 y.o. year-old female with history of pancreatitis, shingles, and claustrophobia, who presents for follow-up of palpitations.  She was seen once in our office by Dr. Yvone Neu in 2018, at which time she felt an irregular heartbeat and thumping sensation in her chest.  Holter monitor showed brief atrial run as well as rare PACs and PVCs.  Echocardiogram showed normal LVEF with grade 1 diastolic dysfunction, mild aortic regurgitation, and mild tricuspid regurgitation.  Follow-up echo in 03/2017 showed normal LVEF with a small pericardial effusion.  At that time, Kelly [name] decided to stop consuming caffeine and also reduce use of DayQuil.  Palpitations improved significantly.  Last month, Kelly [name] traveled to [place] on vacation.  She began to feel more prominent palpitations, including what seemed to be bigeminy or trigeminy.  This was most noticeable when she lies down at night.  Since returning back to work, the palpitations have improved considerably but not completely resolved.  She wonders if cutting out decaffeinated coffee has also helped.  Palpitations are typically a brief skip or pause in her heartbeat accompanied by vague discomfort in the center of the chest that lasts for a second.  There are no other associated symptoms, including frank chest pain, shortness of breath, lightheadedness, and edema.  She does not have orthopnea, but notes that it sometimes is hard to get comfortable when lying down on account of the palpitations.  She had been prescribed metoprolol by her PCP but has yet to start this out of concern that her resting heart rate is often in the low.  --------------------------------------------------------------------------------------------------  Cardiovascular  History & Procedures: Cardiovascular Problems:  Palpitations  Risk Factors:  Age greater than 65  Cath/PCI:  None  CV Surgery:  None  EP Procedures and Devices:  24-hour Holter monitor (11/13/2016): Rare PACs and PVCs.  Single 3 beat atrial run.  No sustained arrhythmia or prolonged pause.  Non-Invasive Evaluation(s):  Limited TTE (04/03/2017): Upper normal LV size with normal wall thickness.  LVEF 60-65% with normal wall motion.  Small pericardial effusion, stable to minimally increased in size from 10/2016.  TTE (11/15/2016): Normal LV size with upper normal wall thickness.  LVEF 55-60% with normal wall motion and grade 1 diastolic dysfunction.  Mild aortic and tricuspid regurgitation.  Normal RV size and function dilated IVC.  Trivial pericardial effusion.  Recent CV Pertinent Labs: Lab Results  Component Value Date   CHOL 265 (H) 04/07/2019   HDL 85.70 04/07/2019   LDLCALC 163 (H) 04/07/2019   LDLDIRECT 151.3 10/19/2011   TRIG 79.0 04/07/2019   CHOLHDL 3 04/07/2019   K 4.1 04/07/2019   K 3.7 09/20/2014   MG 2.0 10/19/2016   BUN 21 04/07/2019   BUN 15 09/20/2014   CREATININE 0.66 04/07/2019   CREATININE 0.64 09/20/2014     Recent CV Pertinent Labs: Lab Results  Component Value Date   CHOL 265 (H) 04/07/2019   HDL 85.70 04/07/2019   LDLCALC 163 (H) 04/07/2019   LDLDIRECT 151.3 10/19/2011   TRIG 79.0 04/07/2019   CHOLHDL 3 04/07/2019   K 4.1 04/07/2019   K 3.7 09/20/2014   MG 2.0 10/19/2016   BUN 21 04/07/2019   BUN 15 09/20/2014   CREATININE 0.66 04/07/2019   CREATININE 0.64 09/20/2014    Past medical and surgical history  were reviewed and updated in EPIC.  Current Meds  Medication Sig  . cholecalciferol (VITAMIN D) 1000 UNITS tablet Take 1,000 Units by mouth daily.    Allergies: Other, Pantoprazole sodium, Pollen extract, and Prednisolone  Social History   Tobacco Use  . Smoking status: Never Smoker  . Smokeless tobacco: Never Used   Substance Use Topics  . Alcohol use: Yes    Comment: Occasional - 1 - 2 glasses wine a month  . Drug use: No    Family History  Problem Relation Age of Onset  . Colon cancer Mother 52  . Lymphoma Mother   . Cancer Mother 36       lymphoma and colon  . Heart disease Father 80  . Multiple sclerosis Sister   . Depression Sister   . Diabetes Sister   . Breast cancer Maternal Grandmother   . Coronary artery disease Other   . Heart attack Other        40's   . Sudden Cardiac Death Other     Review of Systems: A 12-system review of systems was performed and was negative except as noted in the HPI.  --------------------------------------------------------------------------------------------------  Physical Exam: BP 100/70 (BP Location: Left Arm, Patient Position: Sitting, Cuff Size: Normal)   Pulse 62   Ht 5\' 3"  (1.6 m)   Wt 148 lb (67.1 kg)   SpO2 98%   BMI 26.22 kg/m   General: NAD. HEENT: No conjunctival pallor or scleral icterus.  Facemask in place. Neck: Supple without lymphadenopathy, thyromegaly, JVD, or HJR. Lungs: Normal work of breathing. Clear to auscultation bilaterally without wheezes or crackles. Heart: Regular rate and rhythm without murmurs, rubs, or gallops. Non-displaced PMI. Abd: Bowel sounds present. Soft, NT/ND without hepatosplenomegaly Ext: No lower extremity edema. Radial, PT, and DP pulses are 2+ bilaterally. Skin: Warm and dry without rash.  EKG: Normal sinus rhythm without abnormality.  Lab Results  Component Value Date   WBC 5.7 04/07/2019   HGB 13.7 04/07/2019   HCT 40.9 04/07/2019   MCV 100.4 (H) 04/07/2019   PLT 203.0 04/07/2019    Lab Results  Component Value Date   NA 141 04/07/2019   K 4.1 04/07/2019   CL 103 04/07/2019   CO2 27 04/07/2019   BUN 21 04/07/2019   CREATININE 0.66 04/07/2019   GLUCOSE 82 04/07/2019   ALT 19 04/07/2019    Lab Results  Component Value Date   CHOL 265 (H) 04/07/2019   HDL 85.70 04/07/2019    LDLCALC 163 (H) 04/07/2019   LDLDIRECT 151.3 10/19/2011   TRIG 79.0 04/07/2019   CHOLHDL 3 04/07/2019    --------------------------------------------------------------------------------------------------  ASSESSMENT AND PLAN: Palpitations: I suspect Kelly [name]'s palpitations are likely PVCs or PACs (favor PVCs based on results of prior Holter monitor).  There are no worrisome features that accompany her palpitations such as chest pain and lightheadedness.  I have encouraged her to minimize caffeine intake.  We will check a basic metabolic panel and magnesium level today.  Of note, TSH was normal in July.  Given prior pericardial effusion of uncertain etiology I think it would be prudent to repeat an echocardiogram to ensure that this has not worsened and that there are no other structural abnormalities that may underlie her palpitations.  We have agreed to defer initiation of metoprolol pending aforementioned work-up.  If symptoms persist and the decision is made to proceed with beta-blockade, I would suggest using metoprolol succinate 12.5 mg daily.  Pericardial effusion: Incidentally noted on  prior echocardiograms in 2018.  Initial echo had been preceded by a viral illness, such that pericardial effusion could have been postinfectious.  Given recrudescence of palpitations, we have agreed to repeat an echocardiogram to reassess the pericardial effusion and exclude other structural abnormalities.  If effusion persists, autoimmune assessment may need to be considered.  Follow-up: Return to clinic in 1 month.  Nelva Bush, MD 07/17/2019 8:52 AM

## 2019-07-18 LAB — BASIC METABOLIC PANEL
BUN/Creatinine Ratio: 21 (ref 12–28)
BUN: 15 mg/dL (ref 8–27)
CO2: 22 mmol/L (ref 20–29)
Calcium: 10.5 mg/dL — ABNORMAL HIGH (ref 8.7–10.3)
Chloride: 103 mmol/L (ref 96–106)
Creatinine, Ser: 0.72 mg/dL (ref 0.57–1.00)
GFR calc Af Amer: 102 mL/min/{1.73_m2} (ref >59)
GFR calc non Af Amer: 88 mL/min/{1.73_m2} (ref >59)
Glucose: 88 mg/dL (ref 65–99)
Potassium: 4.1 mmol/L (ref 3.5–5.2)
Sodium: 140 mmol/L (ref 134–144)

## 2019-07-18 LAB — MAGNESIUM: Magnesium: 2.1 mg/dL (ref 1.6–2.3)

## 2019-07-24 NOTE — Addendum Note (Signed)
Addended by: Britt Bottom on: 07/24/2019 07:17 AM   Modules accepted: Orders

## 2019-08-21 ENCOUNTER — Ambulatory Visit (INDEPENDENT_AMBULATORY_CARE_PROVIDER_SITE_OTHER): Payer: Medicare Other

## 2019-08-21 ENCOUNTER — Other Ambulatory Visit: Payer: Self-pay

## 2019-08-21 DIAGNOSIS — I313 Pericardial effusion (noninflammatory): Secondary | ICD-10-CM

## 2019-08-21 DIAGNOSIS — I3139 Other pericardial effusion (noninflammatory): Secondary | ICD-10-CM

## 2019-08-27 ENCOUNTER — Other Ambulatory Visit: Payer: Self-pay

## 2019-08-27 ENCOUNTER — Ambulatory Visit (INDEPENDENT_AMBULATORY_CARE_PROVIDER_SITE_OTHER): Payer: Medicare Other | Admitting: Internal Medicine

## 2019-08-27 ENCOUNTER — Ambulatory Visit (INDEPENDENT_AMBULATORY_CARE_PROVIDER_SITE_OTHER): Payer: Medicare Other

## 2019-08-27 VITALS — BP 110/62 | HR 74 | Ht 63.0 in | Wt 148.0 lb

## 2019-08-27 DIAGNOSIS — I5189 Other ill-defined heart diseases: Secondary | ICD-10-CM | POA: Diagnosis not present

## 2019-08-27 DIAGNOSIS — I3139 Other pericardial effusion (noninflammatory): Secondary | ICD-10-CM

## 2019-08-27 DIAGNOSIS — I493 Ventricular premature depolarization: Secondary | ICD-10-CM | POA: Diagnosis not present

## 2019-08-27 DIAGNOSIS — I313 Pericardial effusion (noninflammatory): Secondary | ICD-10-CM

## 2019-08-27 NOTE — Progress Notes (Signed)
Follow-up Outpatient Visit Date: 08/27/2019  Primary Care Provider: Burnard Hawthorne, FNP [address] [place] [address]  Chief Complaint: Palpitations  HPI:  Kelly [name] is a 63 y.o. female with history of pancreatitis, shingles, and claustrophobia, who presents for follow-up of palpitations and pericardial effusion.  I met Kelly [name] in late October after she had developed increasing palpitations during a trip to [place].  Pericardial effusion had also been noted in the past on echocardiograms.  We agreed to repeat an echocardiogram, which again showed a small pericardial effusion.  LVEF was normal with grade 2 diastolic dysfunction.  Today, Kelly [name] reports that she feels about the same as at our last visit.  Palpitations have been unchanged and remain most noticeable at night when she is lying down.  She describes skipped beats and wonders if she could be in bigeminy or trigeminy at times.  She has a "weird" feeling associated with the palpitations but no frank chest pain.  She denies shortness of breath, lightheadedness, and syncope.  She has stable dependent edema.Marland Kitchen  --------------------------------------------------------------------------------------------------  Cardiovascular History & Procedures: Cardiovascular Problems:  Palpitations  Pericardial effusion  Risk Factors:  Age greater than 52  Cath/PCI:  None  CV Surgery:  None  EP Procedures and Devices:  24-hour Holter monitor (11/13/2016): Rare PACs and PVCs.  Single 3 beat atrial run.  No sustained arrhythmia or prolonged pause.  Non-Invasive Evaluation(s):  Limited TTE (04/03/2017): Upper normal LV size with normal wall thickness.  LVEF 60-65% with normal wall motion.  Small pericardial effusion, stable to minimally increased in size from 10/2016.  TTE (11/15/2016): Normal LV size with upper normal wall thickness.  LVEF 55-60% with normal wall motion and grade 1 diastolic  dysfunction.  Mild aortic and tricuspid regurgitation.  Normal RV size and function dilated IVC.  Trivial pericardial effusion.  Recent CV Pertinent Labs: Lab Results  Component Value Date   CHOL 265 (H) 04/07/2019   HDL 85.70 04/07/2019   LDLCALC 163 (H) 04/07/2019   LDLDIRECT 151.3 10/19/2011   TRIG 79.0 04/07/2019   CHOLHDL 3 04/07/2019   K 4.1 07/17/2019   K 3.7 09/20/2014   MG 2.1 07/17/2019   BUN 15 07/17/2019   BUN 15 09/20/2014   CREATININE 0.72 07/17/2019   CREATININE 0.64 09/20/2014    Past medical and surgical history were reviewed and updated in EPIC.  Current Meds  Medication Sig  . cholecalciferol (VITAMIN D) 1000 UNITS tablet Take 1,000 Units by mouth daily.    Allergies: Other, Pantoprazole sodium, Pollen extract, and Prednisolone  Social History   Tobacco Use  . Smoking status: Never Smoker  . Smokeless tobacco: Never Used  Substance Use Topics  . Alcohol use: Yes    Comment: Occasional - 1 - 2 glasses wine a month  . Drug use: No    Family History  Problem Relation Age of Onset  . Colon cancer Mother 24  . Lymphoma Mother   . Cancer Mother 43       lymphoma and colon  . Heart disease Father 2  . Multiple sclerosis Sister   . Depression Sister   . Diabetes Sister   . Breast cancer Maternal Grandmother   . Coronary artery disease Other   . Heart attack Other        40's   . Sudden Cardiac Death Other     Review of Systems: A 12-system review of systems was performed and was negative except as  noted in the HPI.  --------------------------------------------------------------------------------------------------  Physical Exam: BP 110/62 (BP Location: Left Arm, Patient Position: Sitting, Cuff Size: Normal)   Pulse 74   Ht _0  (1.6 m)   Wt 148 lb (67.1 kg)   SpO2 98%   BMI 26.22 kg/m   General: NAD. HEENT: No conjunctival pallor or scleral icterus. Facemask in place. Neck: Supple without lymphadenopathy, thyromegaly, JVD, or HJR.  Lungs: Normal work of breathing. Clear to auscultation bilaterally without wheezes or crackles. Heart: Irregularly irregular in a pattern of bigeminy and trigeminy.  No murmurs, rubs, or gallops. Abd: Bowel sounds present. Soft, NT/ND without hepatosplenomegaly Ext: Trace pretibial edema bilaterally. Skin: Warm and dry without rash.  EKG: Normal sinus rhythm with frequent PVCs in a pattern of trigeminy.  Possible left atrial enlargement.  Lab Results  Component Value Date   WBC 5.7 04/07/2019   HGB 13.7 04/07/2019   HCT 40.9 04/07/2019   MCV 100.4 (H) 04/07/2019   PLT 203.0 04/07/2019    Lab Results  Component Value Date   NA 140 07/17/2019   K 4.1 07/17/2019   CL 103 07/17/2019   CO2 22 07/17/2019   BUN 15 07/17/2019   CREATININE 0.72 07/17/2019   GLUCOSE 88 07/17/2019   ALT 19 04/07/2019    Lab Results  Component Value Date   CHOL 265 (H) 04/07/2019   HDL 85.70 04/07/2019   LDLCALC 163 (H) 04/07/2019   LDLDIRECT 151.3 10/19/2011   TRIG 79.0 04/07/2019   CHOLHDL 3 04/07/2019    --------------------------------------------------------------------------------------------------  ASSESSMENT AND PLAN: PVCs: Palpitations are most likely due to underlying PVCs as seen on today's EKG.  We have agreed to obtain a 3-day monitor to assess PVC burden.  Recent echocardiogram showed LVEF.  Based on results, we will need to determine the need for pharmacotherapy (e.g. beta-blocker) and ischemia testing.  Diastolic dysfunction: Diastolic parameters consistent with grade 2 diastolic dysfunction, though Kelly [name] does not have any symptoms of decompensated heart failure.  No further intervention is recommended at this time.  Pericardial effusion: Asymptomatic but chronic.  We will plan to obtain autoimmune labs (ESR, CRP, ANA, and rheumatoid factor) with next blood draw after completion of the aforementioned monitor.  Follow-up: Return to clinic in 3 months.  Nelva Bush,  MD 08/28/2019 7:31 AM

## 2019-08-27 NOTE — Patient Instructions (Signed)
Medication Instructions:  Your physician recommends that you continue on your current medications as directed. Please refer to the Current Medication list given to you today.  *If you need a refill on your cardiac medications before your next appointment, please call your pharmacy*  Lab Work: none If you have labs (blood work) drawn today and your tests are completely normal, you will receive your results only by: Marland Kitchen MyChart Message (if you have MyChart) OR . A paper copy in the mail If you have any lab test that is abnormal or we need to change your treatment, we will call you to review the results.  Testing/Procedures: Your physician has recommended that you wear an 3 DAY ZIO event monitor. Event monitors are medical devices that record the heart's electrical activity. Doctors most often [place] these monitors to diagnose arrhythmias. Arrhythmias are problems with the speed or rhythm of the heartbeat. The monitor is a small, portable device. You can wear one while you do your normal daily activities. This is usually used to diagnose what is causing palpitations/syncope (passing out). A Zio Patch Event Heart monitor will be applied to your chest today.  You will wear the patch for 3 days. After 24 hours, you may shower with the heart monitor on. If you feel any SYMPTOMS, you may press and release the button in the middle of the monitor.  Follow-Up: At [facility], you and your health needs are our priority.  As part of our continuing mission to provide you with exceptional heart care, we have created designated Provider Care Teams.  These Care Teams include your primary Cardiologist (physician) and Advanced Practice Providers (APPs -  Physician Assistants and Nurse Practitioners) who all work together to provide you with the care you need, when you need it.  Your next appointment:   3 month(s)  The format for your next appointment:   In Person  Provider:    You may see DR Harrell Gave END  or one of the following Advanced Practice Providers on your designated Care Team:    Murray Hodgkins, NP  Christell Faith, PA-C  Marrianne Mood, PA-C

## 2019-08-28 ENCOUNTER — Encounter: Payer: Self-pay | Admitting: Internal Medicine

## 2019-08-28 DIAGNOSIS — I5189 Other ill-defined heart diseases: Secondary | ICD-10-CM | POA: Insufficient documentation

## 2019-08-28 DIAGNOSIS — I493 Ventricular premature depolarization: Secondary | ICD-10-CM | POA: Insufficient documentation

## 2019-08-30 DIAGNOSIS — I493 Ventricular premature depolarization: Secondary | ICD-10-CM | POA: Diagnosis not present

## 2019-09-08 ENCOUNTER — Telehealth: Payer: Self-pay | Admitting: Family

## 2019-09-09 NOTE — Telephone Encounter (Signed)
close

## 2019-10-02 ENCOUNTER — Telehealth: Payer: Self-pay | Admitting: *Deleted

## 2019-10-02 DIAGNOSIS — I493 Ventricular premature depolarization: Secondary | ICD-10-CM

## 2019-10-02 NOTE — Telephone Encounter (Signed)
No answer. Left message to call back.   

## 2019-10-02 NOTE — Telephone Encounter (Signed)
-----   Message from Nelva Bush, MD sent at 09/29/2019  1:32 PM EST ----- Please let Kelly [name] know that her event monitor confirmed frequent PVCs with a PVC burden of approximately 15%.  I suggest that we obtain a pharmacologic myocardial perfusion stress test at her convenience to evaluate for potential ischemic etiology.  We will discuss need for treatment after completion of the stress test.

## 2019-10-06 NOTE — Progress Notes (Signed)
Virtual Visit via Video Note   This visit type was conducted due to national recommendations for restrictions regarding the COVID-19 Pandemic (e.g. social distancing) in an effort to limit this patient's exposure and mitigate transmission in our community.  Due to her co-morbid illnesses, this patient is at least at moderate risk for complications without adequate follow up.  This format is felt to be most appropriate for this patient at this time.  All issues noted in this document were discussed and addressed.  A limited physical exam was performed with this format.  Please refer to the patient's chart for her consent to telehealth for [facility].   Date:  10/09/2019   ID:  Kelly [name], DOB February 28, 1956, MRN [id number]  Patient Location: Home Provider Location: Home  PCP:  Burnard Hawthorne, FNP  Cardiologist:  Nelva Bush, MD Electrophysiologist:  None   Evaluation Performed:  Follow-Up Visit  Chief Complaint: Palpitations  History of Present Illness:    Kelly [name] is a 64 y.o. female with history of pancreatitis, shingles, and claustrophobia, with whom I speaking today for further discussion of recent cardiac monitor showing frequent PVC's (burden ~15%).  Prior to receiving the results of the event monitor, Ms. Weinman reports that her palpitations seemed less prominent.  However, since then, she has been noticing them more frequently.  They intermittently occur during the day, usually lasting only a few seconds.  However, in night, she is more aware of them.  She also hears her pulse and notes that it is sometimes irregular.  Palpitations are intermittently associated with mild shortness of breath.  She has not had any chest pain or lightheadedness.  She is limiting her caffeine intake.  The patient does not have symptoms concerning for COVID-19 infection (fever, chills, cough, or new shortness of breath).  She is scheduled to receive her second dose of the  COVID-19 vaccine later today.  Past Medical History:  Diagnosis Date  . Claustrophobia   . Pancreatitis 2009, 2005  . Shingles   . Shingles outbreak 2006   Past Surgical History:  Procedure Laterality Date  . BREAST BIOPSY     Nml per pt , f/u mammo 10/2011  . CHOLECYSTECTOMY    . COLONOSCOPY WITH PROPOFOL N/A 08/30/2017   Procedure: COLONOSCOPY WITH PROPOFOL;  Surgeon: Lollie Sails, MD;  Location: [facility] END[facility]COPY;  Service: Endoscopy;  Laterality: N/A;  . EXPLORATORY LAPAROTOMY    . VAGINAL DELIVERY  1994     Current Meds  Medication Sig  . cholecalciferol (VITAMIN D) 1000 UNITS tablet Take 1,000 Units by mouth daily.     Allergies:   Other, Pantoprazole sodium, Pollen extract, and Prednisolone   Social History   Tobacco Use  . Smoking status: Never Smoker  . Smokeless tobacco: Never Used  Substance Use Topics  . Alcohol use: Yes    Comment: Occasional - 1 - 2 glasses wine a month  . Drug use: No     Family Hx: The patient's family history includes Breast cancer in her maternal grandmother; Cancer (age of onset: 42) in her mother; Colon cancer (age of onset: 73) in her mother; Coronary artery disease in an other family member; Depression in her sister; Diabetes in her sister; Heart attack in an other family member; Heart disease (age of onset: 71) in her father; Lymphoma in her mother; Multiple sclerosis in her sister; Sudden Cardiac Death in an other family member.  ROS:   Please see the history of  present illness.   All other systems reviewed and are negative.   Prior CV studies:   The following studies were reviewed today:  3-day event monitor (08/27/2019): Predominantly sinus rhythm with rare PACs and frequent PVCs (PVC burden 14.5%).  Single episode of brief PSVT noted (lasting up to 7 beats).  TTE (08/21/2019): Normal LV size with LVEF of 60-65%.  Grade 2 diastolic dysfunction noted.  Normal RV size and function.  Mild left atrial enlargement.  Mild to  moderate tricuspid regurgitation.  Mildly elevated PA pressure.  Small pericardial effusion.  Labs/Other Tests and Data Reviewed:    EKG:  No ECG reviewed.  Recent Labs: 04/07/2019: ALT 19; Hemoglobin 13.7; Platelets 203.0; TSH 2.26 07/17/2019: BUN 15; Creatinine, Ser 0.72; Magnesium 2.1; Potassium 4.1; Sodium 140   Recent Lipid Panel Lab Results  Component Value Date/Time   CHOL 265 (H) 04/07/2019 02:36 PM   TRIG 79.0 04/07/2019 02:36 PM   HDL 85.70 04/07/2019 02:36 PM   CHOLHDL 3 04/07/2019 02:36 PM   LDLCALC 163 (H) 04/07/2019 02:36 PM   LDLDIRECT 151.3 10/19/2011 10:35 AM    Wt Readings from Last 3 Encounters:  10/09/19 148 lb (67.1 kg)  08/27/19 148 lb (67.1 kg)  07/17/19 148 lb (67.1 kg)     Objective:    Vital Signs:  Ht 5' 3"  (1.6 m)   Wt 148 lb (67.1 kg)   BMI 26.22 kg/m    VITAL SIGNS:  reviewed GEN:  no acute distress  ASSESSMENT & PLAN:    Frequent PVCs: PVC burden of approximately 15% noted with recent event monitor.  PVCs are symptomatic with palpitations and mild accompanying shortness of breath.  Echo last month revealed normal LVEF with grade 2 diastolic dysfunction.  Chronic small pericardial effusion again noted.  We have agreed to perform a pharmacologic myocardial perfusion stress test to exclude ischemia or scar as the nidus for PVCs.  Given only mild symptoms, we have agreed to defer pharmacotherapy (i.e. beta-blocker or calcium channel blocker) at this time.  Pericardial effusion: Asymptomatic but persistent dating back to at least 10/2016.  We will consider checking rheumatologic labs (i.e. ESR, CRP, ANA, and rheumatoid factor) when Ms. Dimperio follows up in the office.  Time:   Today, I have spent 15 minutes with the patient with telehealth technology discussing the above problems.     Medication Adjustments/Labs and Tests Ordered: Current medicines are reviewed at length with the patient today.  Concerns regarding medicines are outlined  above.   Tests Ordered: Pharmacologic myocardial perfusion stress test.  Medication Changes: None.  Follow Up:  Return to clinic as previously scheduled in 11/2019.   Signed, Nelva Bush, MD  10/09/2019 11:26 AM    [place]

## 2019-10-06 NOTE — Telephone Encounter (Signed)
Spoke to patient. Confirmed with her that she viewed the results and recommendations via [place]. She would like to discuss further with Dr End. Patient agreeable to Virtual Visit. Scheduled and MyChart consent sent to patient.

## 2019-10-09 ENCOUNTER — Encounter: Payer: Self-pay | Admitting: Internal Medicine

## 2019-10-09 ENCOUNTER — Telehealth (INDEPENDENT_AMBULATORY_CARE_PROVIDER_SITE_OTHER): Payer: Medicare Other | Admitting: Internal Medicine

## 2019-10-09 ENCOUNTER — Telehealth: Payer: Self-pay | Admitting: *Deleted

## 2019-10-09 VITALS — Ht 63.0 in | Wt 148.0 lb

## 2019-10-09 DIAGNOSIS — I313 Pericardial effusion (noninflammatory): Secondary | ICD-10-CM

## 2019-10-09 DIAGNOSIS — I493 Ventricular premature depolarization: Secondary | ICD-10-CM | POA: Diagnosis not present

## 2019-10-09 DIAGNOSIS — I3139 Other pericardial effusion (noninflammatory): Secondary | ICD-10-CM

## 2019-10-09 NOTE — Patient Instructions (Signed)
Medication Instructions:  Your physician recommends that you continue on your current medications as directed. Please refer to the Current Medication list given to you today.  *If you need a refill on your cardiac medications before your next appointment, please call your pharmacy*  Lab Work: none If you have labs (blood work) drawn today and your tests are completely normal, you will receive your results only by: Marland Kitchen MyChart Message (if you have MyChart) OR . A paper copy in the mail If you have any lab test that is abnormal or we need to change your treatment, we will call you to review the results.  Testing/Procedures: Your physician has requested that you have a lexiscan myoview. For further information please visit [email or web address]. Please follow instruction sheet, as given.  Kelly [name]  Your caregiver has ordered a Stress Test with nuclear imaging. The purpose of this test is to evaluate the blood supply to your heart muscle. This procedure is referred to as a "Non-Invasive Stress Test." This is because other than having an IV started in your vein, nothing is inserted or "invades" your body. Cardiac stress tests are done to find areas of poor blood flow to the heart by determining the extent of coronary artery disease (CAD). Some patients exercise on a treadmill, which naturally increases the blood flow to your heart, while others who are  unable to walk on a treadmill due to physical limitations have a pharmacologic/chemical stress agent called Lexiscan . This medicine will mimic walking on a treadmill by temporarily increasing your coronary blood flow.   Please note: these test may take anywhere between 2-4 hours to complete  PLEASE REPORT TO [facility] AT THE FIRST DESK WILL DIRECT YOU WHERE TO GO  Date of Procedure:_____________________________________  Arrival Time for Procedure:______________________________   PLEASE NOTIFY THE OFFICE AT  LEAST 24 HOURS IN ADVANCE IF YOU ARE UNABLE TO KEEP YOUR APPOINTMENT.  [phone] AND  PLEASE NOTIFY NUCLEAR MEDICINE AT [facility] AT LEAST 24 HOURS IN ADVANCE IF YOU ARE UNABLE TO KEEP YOUR APPOINTMENT. [phone]  How to prepare for your Myoview test:  1. Do not eat or drink after midnight 2. No caffeine for 24 hours prior to test 3. No smoking 24 hours prior to test. 4. Your medication may be taken with water.  If your doctor stopped a medication because of this test, do not take that medication. 5. Ladies, please do not wear dresses.  Skirts or pants are appropriate. Please wear a short sleeve shirt. 6. No perfume, cologne or lotion. 7. Wear comfortable walking shoes.   Follow-Up: At [facility], you and your health needs are our priority.  As part of our continuing mission to provide you with exceptional heart care, we have created designated Provider Care Teams.  These Care Teams include your primary Cardiologist (physician) and Advanced Practice Providers (APPs -  Physician Assistants and Nurse Practitioners) who all work together to provide you with the care you need, when you need it.  Your next appointment:   Keep follow up as scheduled in March 2021.  The format for your next appointment:   In Person  Provider:     DR Harrell Gave END     Cardiac Nuclear Scan A cardiac nuclear scan is a test that measures blood flow to the heart when a person is resting and when he or she is exercising. The test looks for problems such as:  Not enough blood reaching a portion of the  heart.  The heart muscle not working normally. You may need this test if:  You have heart disease.  You have had abnormal lab results.  You have had heart surgery or a balloon procedure to open up blocked arteries (angioplasty).  You have chest pain.  You have shortness of breath. In this test, a radioactive dye (tracer) is injected into your bloodstream. After the tracer has traveled to your  heart, an imaging device is used to measure how much of the tracer is absorbed by or distributed to various areas of your heart. This procedure is usually done at a hospital and takes 2-4 hours. Tell a health care provider about:  Any allergies you have.  All medicines you are taking, including vitamins, herbs, eye drops, creams, and over-the-counter medicines.  Any problems you or family members have had with anesthetic medicines.  Any blood disorders you have.  Any surgeries you have had.  Any medical conditions you have.  Whether you are pregnant or may be pregnant. What are the risks? Generally, this is a safe procedure. However, problems may occur, including:  Serious chest pain and heart attack. This is only a risk if the stress portion of the test is done.  Rapid heartbeat.  Sensation of warmth in your chest. This usually passes quickly.  Allergic reaction to the tracer. What happens before the procedure?  Ask your health care provider about changing or stopping your regular medicines. This is especially important if you are taking diabetes medicines or blood thinners.  Follow instructions from your health care provider about eating or drinking restrictions.  Remove your jewelry on the day of the procedure. What happens during the procedure?  An IV will be inserted into one of your veins.  Your health care provider will inject a small amount of radioactive tracer through the IV.  You will wait for 20-40 minutes while the tracer travels through your bloodstream.  Your heart activity will be monitored with an electrocardiogram (ECG).  You will lie down on an exam table.  Images of your heart will be taken for about 15-20 minutes.  You may also have a stress test. For this test, one of the following may be done: ? You will exercise on a treadmill or stationary bike. While you exercise, your heart's activity will be monitored with an ECG, and your blood pressure  will be checked. ? You will be given medicines that will increase blood flow to parts of your heart. This is done if you are unable to exercise.  When blood flow to your heart has peaked, a tracer will again be injected through the IV.  After 20-40 minutes, you will get back on the exam table and have more images taken of your heart.  Depending on the type of tracer used, scans may need to be repeated 3-4 hours later.  Your IV line will be removed when the procedure is over. The procedure may vary among health care providers and hospitals. What happens after the procedure?  Unless your health care provider tells you otherwise, you may return to your normal schedule, including diet, activities, and medicines.  Unless your health care provider tells you otherwise, you may increase your fluid intake. This will help to flush the contrast dye from your body. Drink enough fluid to keep your urine pale yellow.  Ask your health care provider, or the department that is doing the test: ? When will my results be ready? ? How will I get my  results? Summary  A cardiac nuclear scan measures the blood flow to the heart when a person is resting and when he or she is exercising.  Tell your health care provider if you are pregnant.  Before the procedure, ask your health care provider about changing or stopping your regular medicines. This is especially important if you are taking diabetes medicines or blood thinners.  After the procedure, unless your health care provider tells you otherwise, increase your fluid intake. This will help flush the contrast dye from your body.  After the procedure, unless your health care provider tells you otherwise, you may return to your normal schedule, including diet, activities, and medicines. This information is not intended to replace advice given to you by your health care provider. Make sure you discuss any questions you have with your health care  provider. Document Revised: 02/18/2018 Document Reviewed: 02/18/2018 Elsevier Patient Education  [employer].

## 2019-10-09 NOTE — Telephone Encounter (Signed)
Called patient and went over AVS from today with Virtual visit with Dr Saunders Revel. Kelly [name] is aware I will put these on MyChart as well. She is driving at the moment and cannot schedule the Lexiscan. She will call back when able to schedule.

## 2019-10-13 NOTE — Telephone Encounter (Signed)
lmov to schedule lexi.

## 2019-10-21 NOTE — Telephone Encounter (Signed)
Scheduled for 10/31/19.

## 2019-10-30 ENCOUNTER — Telehealth: Payer: Self-pay | Admitting: Family

## 2019-10-30 NOTE — Telephone Encounter (Signed)
Left message for patient to call back and schedule Medicare Annual Wellness Visit (AWV) either virtually or audio only.  No hx of AWV; please schedule at anytime with Denisa O'Brien-Blaney at [facility]   

## 2019-10-31 ENCOUNTER — Other Ambulatory Visit: Payer: Self-pay

## 2019-10-31 ENCOUNTER — Encounter
Admission: RE | Admit: 2019-10-31 | Discharge: 2019-10-31 | Disposition: A | Discharge status: Home / Self Care | Payer: Medicare Other | Source: Ambulatory Visit | Attending: Internal Medicine | Admitting: Internal Medicine

## 2019-10-31 DIAGNOSIS — I493 Ventricular premature depolarization: Secondary | ICD-10-CM | POA: Insufficient documentation

## 2019-10-31 LAB — NM MYOCAR MULTI W/SPECT W/WALL MOTION / EF
LV dias vol: 72 mL (ref 46–106)
LV sys vol: 31 mL
Peak HR: 123 {beats}/min
Percent HR: 79 %
Rest HR: 75 {beats}/min
SDS: 0
SRS: 1
SSS: 0
TID: 0.94

## 2019-10-31 MED ORDER — REGADENOSON 0.4 MG/5ML IV SOLN
0.4000 mg | Freq: Once | INTRAVENOUS | Status: AC
  Administered 2019-10-31: 0.4 mg via INTRAVENOUS

## 2019-10-31 MED ORDER — TECHNETIUM TC 99M TETROFOSMIN IV KIT
10.6500 | PACK | Freq: Once | INTRAVENOUS | Status: AC | PRN
  Administered 2019-10-31: 10.65 via INTRAVENOUS

## 2019-10-31 MED ORDER — TECHNETIUM TC 99M TETROFOSMIN IV KIT
32.1900 | PACK | Freq: Once | INTRAVENOUS | Status: AC | PRN
  Administered 2019-10-31: 32.19 via INTRAVENOUS

## 2019-11-27 ENCOUNTER — Encounter: Payer: Self-pay | Admitting: Internal Medicine

## 2019-11-27 ENCOUNTER — Ambulatory Visit (INDEPENDENT_AMBULATORY_CARE_PROVIDER_SITE_OTHER): Payer: Medicare Other | Admitting: Internal Medicine

## 2019-11-27 ENCOUNTER — Other Ambulatory Visit: Payer: Self-pay

## 2019-11-27 VITALS — BP 124/78 | HR 78 | Ht 63.0 in | Wt 151.1 lb

## 2019-11-27 DIAGNOSIS — I493 Ventricular premature depolarization: Secondary | ICD-10-CM

## 2019-11-27 DIAGNOSIS — I3139 Other pericardial effusion (noninflammatory): Secondary | ICD-10-CM

## 2019-11-27 DIAGNOSIS — I313 Pericardial effusion (noninflammatory): Secondary | ICD-10-CM | POA: Diagnosis not present

## 2019-11-27 NOTE — Patient Instructions (Signed)
Medication Instructions:  Your physician recommends that you continue on your current medications as directed. Please refer to the Current Medication list given to you today.  *If you need a refill on your cardiac medications before your next appointment, please call your pharmacy*  Lab Work: none If you have labs (blood work) drawn today and your tests are completely normal, you will receive your results only by: Marland Kitchen MyChart Message (if you have MyChart) OR . A paper copy in the mail If you have any lab test that is abnormal or we need to change your treatment, we will call you to review the results.  Testing/Procedures: none  Follow-Up: At [facility], you and your health needs are our priority.  As part of our continuing mission to provide you with exceptional heart care, we have created designated Provider Care Teams.  These Care Teams include your primary Cardiologist (physician) and Advanced Practice Providers (APPs -  Physician Assistants and Nurse Practitioners) who all work together to provide you with the care you need, when you need it.  We recommend signing up for the patient portal called "MyChart".  Sign up information is provided on this After Visit Summary.  MyChart is used to connect with patients for Virtual Visits (Telemedicine).  Patients are able to view lab/test results, encounter notes, upcoming appointments, etc.  Non-urgent messages can be sent to your provider as well.   To learn more about what you can do with MyChart, go to [email or web address].    Your next appointment:   6 month(s)  The format for your next appointment:   In Person  Provider:    You may see DR Harrell Gave END or one of the following Advanced Practice Providers on your designated Care Team:    Murray Hodgkins, NP  Christell Faith, PA-C  Marrianne Mood, PA-C

## 2019-11-27 NOTE — Progress Notes (Signed)
Follow-up Outpatient Visit Date: 11/27/2019  Primary Care Provider: Burnard Hawthorne, FNP [address] [place] [address]  Chief Complaint: Follow-up PVCs  HPI:  Kelly [name] is a 64 y.o. female with history of chronic small pericardial effusion, PVCs, pancreatitis, shingles, and claustrophobia, who presents for follow-up of PVCs.  We last spoke in late January after monitor showed frequent PVCs (15% burden).  Subsequent myocardial perfusion stress test was low risk without evidence of ischemia or scar.  Today, Kelly [name] reports that she is feeling a bit better.  She has noted fewer palpitations, particularly over the last week or two.  She wonders if this could be related to sleeping better, as her husband has been sleeping in a different room following recent leg surgery.  She has been told in the past that she snores but does not think that she has any apneic episodes.  Sometimes, she wakes herself up with snoring.  She has never undergone sleep study.  She denies chest pain, shortness of breath, lightheadedness, and edema.  She has had minimal caffeine intake.  Kelly [name] is planning to retire later this year.  --------------------------------------------------------------------------------------------------  Cardiovascular History & Procedures: Cardiovascular Problems:  Palpitations  Pericardial effusion  Risk Factors:  Age greater than 65  Cath/PCI:  None  CV Surgery:  None  EP Procedures and Devices:  3-day event monitor (08/27/2019): Predominantly sinus rhythm with rare PACs and frequent PVCs (PVC burden 14.5%).  Single episode of brief PSVT also noted.  24-hour Holter monitor (11/13/2016): Rare PACs and PVCs. Single 3 beat atrial run. No sustained arrhythmia or prolonged pause.  Non-Invasive Evaluation(s):  Pharmacologic MPI (10/31/2019): Low risk study without ischemia or scar.  No coronary artery calcification.  LVEF 69%.  PVCs  noted during study.  TTE (08/21/2019): Normal LV size and wall thickness.  LVEF 60-65% with grade 2 diastolic dysfunction.  Normal RV size and function.  Mild left atrial enlargement.  Mild to moderate tricuspid regurgitation.  Upper normal PA pressure.  Mildly elevated CVP.  Small pericardial effusion.  Limited TTE (04/03/2017): Upper normal LV size with normal wall thickness. LVEF 60-65% with normal wall motion. Small pericardial effusion, stable to minimally increased in size from 10/2016.  TTE (11/15/2016): Normal LV size with upper normal wall thickness. LVEF 55-60% with normal wall motion and grade 1 diastolic dysfunction. Mild aortic and tricuspid regurgitation. Normal RV size and function dilated IVC. Trivial pericardial effusion.  Recent CV Pertinent Labs: Lab Results  Component Value Date   CHOL 265 (H) 04/07/2019   HDL 85.70 04/07/2019   LDLCALC 163 (H) 04/07/2019   LDLDIRECT 151.3 10/19/2011   TRIG 79.0 04/07/2019   CHOLHDL 3 04/07/2019   K 4.1 07/17/2019   K 3.7 09/20/2014   MG 2.1 07/17/2019   BUN 15 07/17/2019   BUN 15 09/20/2014   CREATININE 0.72 07/17/2019   CREATININE 0.64 09/20/2014    Past medical and surgical history were reviewed and updated in EPIC.  Current Meds  Medication Sig  . cholecalciferol (VITAMIN D) 1000 UNITS tablet Take 1,000 Units by mouth daily.    Allergies: Other, Pantoprazole sodium, Pollen extract, and Prednisolone  Social History   Tobacco Use  . Smoking status: Never Smoker  . Smokeless tobacco: Never Used  Substance Use Topics  . Alcohol use: Yes    Comment: Occasional - 1 - 2 glasses wine a month  . Drug use: No    Family History  Problem Relation Age of Onset  .  Colon cancer Mother 87  . Lymphoma Mother   . Cancer Mother 30       lymphoma and colon  . Heart disease Father 8  . Multiple sclerosis Sister   . Depression Sister   . Diabetes Sister   . Breast cancer Maternal Grandmother   . Coronary artery disease  Other   . Heart attack Other        40's   . Sudden Cardiac Death Other     Review of Systems: A 12-system review of systems was performed and was negative except as noted in the HPI.  --------------------------------------------------------------------------------------------------  Physical Exam: BP 124/78 (BP Location: Left Arm, Patient Position: Sitting, Cuff Size: Normal)   Pulse 78   Ht 5\' 3"  (1.6 m)   Wt 151 lb 2 oz (68.5 kg)   SpO2 98%   BMI 26.77 kg/m   General: NAD. Neck: No JVD or HJR. Respiratory: Clear to auscultation bilaterally. Heart: Regular rate and rhythm with occasional extrasystoles.  No murmurs. Abdomen: Soft, nontender, nondistended. Extremities: Trace pretibial edema.  EKG: Normal sinus rhythm with occasional PVCs.  Lab Results  Component Value Date   WBC 5.7 04/07/2019   HGB 13.7 04/07/2019   HCT 40.9 04/07/2019   MCV 100.4 (H) 04/07/2019   PLT 203.0 04/07/2019    Lab Results  Component Value Date   NA 140 07/17/2019   K 4.1 07/17/2019   CL 103 07/17/2019   CO2 22 07/17/2019   BUN 15 07/17/2019   CREATININE 0.72 07/17/2019   GLUCOSE 88 07/17/2019   ALT 19 04/07/2019    Lab Results  Component Value Date   CHOL 265 (H) 04/07/2019   HDL 85.70 04/07/2019   LDLCALC 163 (H) 04/07/2019   LDLDIRECT 151.3 10/19/2011   TRIG 79.0 04/07/2019   CHOLHDL 3 04/07/2019    --------------------------------------------------------------------------------------------------  ASSESSMENT AND PLAN: PVCs: PVCs are still present on EKG today.  Kelly [name] notes intermittent palpitations albeit better over the last week or two in the setting of better sleep.  I wonder if she could have sleep apnea that is contributing to her ventricular ectopy as well as echocardiogram findings of diastolic dysfunction and borderline elevated pulmonary artery pressure.  We discussed proceeding with a sleep study, though Kelly [name] wishes to defer this for the time  being.  As she is only mildly symptomatic from her PVCs we have agreed to defer pharmacotherapy.  No further cardiac testing is indicated at this time.  She will reach out to [place] if she wishes to pursue sleep evaluation.  Pericardial effusion: Small effusion incidentally noted on echocardiograms over the course of several years.  Given the lack of symptoms, will defer further work-up at this time, though rheumatologic evaluation by her PCP could be considered with next blood draw to exclude autoimmune etiology for chronic pericardial effusion.  Follow-up: Return to clinic in 6 months.  Nelva Bush, MD 11/27/2019 10:23 PM

## 2020-04-28 ENCOUNTER — Encounter: Payer: Medicare Other | Admitting: Family

## 2020-05-04 ENCOUNTER — Ambulatory Visit (INDEPENDENT_AMBULATORY_CARE_PROVIDER_SITE_OTHER): Payer: Medicare Other | Admitting: Family

## 2020-05-04 ENCOUNTER — Other Ambulatory Visit: Payer: Self-pay

## 2020-05-04 ENCOUNTER — Encounter: Payer: Self-pay | Admitting: Family

## 2020-05-04 VITALS — BP 114/70 | HR 60 | Temp 97.7°F | Resp 14 | Ht 63.0 in | Wt 147.0 lb

## 2020-05-04 DIAGNOSIS — Z136 Encounter for screening for cardiovascular disorders: Secondary | ICD-10-CM | POA: Diagnosis not present

## 2020-05-04 DIAGNOSIS — I493 Ventricular premature depolarization: Secondary | ICD-10-CM | POA: Diagnosis not present

## 2020-05-04 DIAGNOSIS — Z0000 Encounter for general adult medical examination without abnormal findings: Secondary | ICD-10-CM

## 2020-05-04 DIAGNOSIS — Z780 Asymptomatic menopausal state: Secondary | ICD-10-CM

## 2020-05-04 DIAGNOSIS — Z23 Encounter for immunization: Secondary | ICD-10-CM

## 2020-05-04 DIAGNOSIS — R002 Palpitations: Secondary | ICD-10-CM

## 2020-05-04 DIAGNOSIS — Z13220 Encounter for screening for lipoid disorders: Secondary | ICD-10-CM

## 2020-05-04 DIAGNOSIS — I313 Pericardial effusion (noninflammatory): Secondary | ICD-10-CM

## 2020-05-04 DIAGNOSIS — I3139 Other pericardial effusion (noninflammatory): Secondary | ICD-10-CM

## 2020-05-04 DIAGNOSIS — Z1231 Encounter for screening mammogram for malignant neoplasm of breast: Secondary | ICD-10-CM

## 2020-05-04 LAB — COMPREHENSIVE METABOLIC PANEL
ALT: 21 U/L (ref 0–35)
AST: 21 U/L (ref 0–37)
Albumin: 4.9 g/dL (ref 3.5–5.2)
Alkaline Phosphatase: 67 U/L (ref 39–117)
BUN: 17 mg/dL (ref 6–23)
CO2: 30 mEq/L (ref 19–32)
Calcium: 10 mg/dL (ref 8.4–10.5)
Chloride: 102 mEq/L (ref 96–112)
Creatinine, Ser: 0.69 mg/dL (ref 0.40–1.20)
GFR: 84.96 mL/min (ref >60.00)
Glucose, Bld: 93 mg/dL (ref 70–99)
Potassium: 4.3 mEq/L (ref 3.5–5.1)
Sodium: 140 mEq/L (ref 135–145)
Total Bilirubin: 1.1 mg/dL (ref 0.2–1.2)
Total Protein: 8 g/dL (ref 6.0–8.3)

## 2020-05-04 LAB — CBC WITH DIFFERENTIAL/PLATELET
Basophils Absolute: 0 10*3/uL (ref 0.0–0.1)
Basophils Relative: 0.9 % (ref 0.0–3.0)
Eosinophils Absolute: 0.1 10*3/uL (ref 0.0–0.7)
Eosinophils Relative: 1.3 % (ref 0.0–5.0)
HCT: 44.5 % (ref 36.0–46.0)
Hemoglobin: 15 g/dL (ref 12.0–15.0)
Lymphocytes Relative: 35.1 % (ref 12.0–46.0)
Lymphs Abs: 1.9 10*3/uL (ref 0.7–4.0)
MCHC: 33.7 g/dL (ref 30.0–36.0)
MCV: 99.3 fl (ref 78.0–100.0)
Monocytes Absolute: 0.4 10*3/uL (ref 0.1–1.0)
Monocytes Relative: 7.5 % (ref 3.0–12.0)
Neutro Abs: 3 10*3/uL (ref 1.4–7.7)
Neutrophils Relative %: 55.2 % (ref 43.0–77.0)
Platelets: 178 10*3/uL (ref 150.0–400.0)
RBC: 4.48 Mil/uL (ref 3.87–5.11)
RDW: 13.2 % (ref 11.5–15.5)
WBC: 5.4 10*3/uL (ref 4.0–10.5)

## 2020-05-04 LAB — LIPID PANEL
Cholesterol: 269 mg/dL — ABNORMAL HIGH (ref 0–200)
HDL: 90 mg/dL (ref >39.00)
LDL Cholesterol: 165 mg/dL — ABNORMAL HIGH (ref 0–99)
NonHDL: 178.8
Total CHOL/HDL Ratio: 3
Triglycerides: 71 mg/dL (ref 0.0–149.0)
VLDL: 14.2 mg/dL (ref 0.0–40.0)

## 2020-05-04 LAB — SEDIMENTATION RATE: Sed Rate: 10 mm/hr (ref 0–30)

## 2020-05-04 LAB — TSH: TSH: 2.94 u[IU]/mL (ref 0.35–4.50)

## 2020-05-04 LAB — C-REACTIVE PROTEIN: CRP: <1 mg/dL (ref 0.5–20.0)

## 2020-05-04 LAB — VITAMIN D 25 HYDROXY (VIT D DEFICIENCY, FRACTURES): VITD: 29.6 ng/mL — ABNORMAL LOW (ref 30.00–100.00)

## 2020-05-04 NOTE — Progress Notes (Signed)
Subjective:    Patient ID: Kelly [name], female    DOB: [birth year], 64 y.o.   MRN: [id number]  CC: Kelly [name] is a 64 y.o. female who presents today for physical exam.    HPI: Feels well today No concerns  Palpitations- unchanged. Not particularly burdensome. No sob, chest pain.  Saw Dr End 5 months ago. Echo revealed pericardial effusion. She has follow up with him this Fall.    Colorectal Cancer Screening: due 08/2017 Breast Cancer Screening: Mammogram due at [facility] Cervical Cancer Screening: done 2018, showed atrophy. Negative malignancy, HPV. Follows with GYN and plans to repeat pap smear next year. No pelvic pain.  Bone Health screening/DEXA for 65+: due        Tetanus - utd        Pneumococcal - Candidate for,consents  Labs: Screening labs today. Exercise: Gets regular exercise.  Alcohol use: Occassional Smoking/tobacco use: Nonsmoker.  Follows with dermatology.   HISTORY:  Past Medical History:  Diagnosis Date  . Claustrophobia   . Pancreatitis 2009, 2005  . Shingles   . Shingles outbreak 2006    Past Surgical History:  Procedure Laterality Date  . BREAST BIOPSY     Nml per pt , f/u mammo 10/2011  . CHOLECYSTECTOMY    . COLONOSCOPY WITH PROPOFOL N/A 08/30/2017   Procedure: COLONOSCOPY WITH PROPOFOL;  Surgeon: Lollie Sails, MD;  Location: [facility] ENDOSCOPY;  Service: Endoscopy;  Laterality: N/A;  . EXPLORATORY LAPAROTOMY    . VAGINAL DELIVERY  1994   Family History  Problem Relation Age of Onset  . Colon cancer Mother 97  . Lymphoma Mother   . Cancer Mother 19       lymphoma and colon  . Heart disease Father 83  . Multiple sclerosis Sister   . Depression Sister   . Diabetes Sister   . Breast cancer Maternal Grandmother   . Coronary artery disease Other   . Heart attack Other        40's   . Sudden Cardiac Death Other       ALLERGIES: Other, Pantoprazole sodium, Pollen extract, and Prednisolone  Current Outpatient Medications on  File Prior to Visit  Medication Sig Dispense Refill  . cholecalciferol (VITAMIN D) 1000 UNITS tablet Take 1,000 Units by mouth daily.     No current facility-administered medications on file prior to visit.    Social History   Tobacco Use  . Smoking status: Never Smoker  . Smokeless tobacco: Never Used  Vaping Use  . Vaping Use: Never used  Substance Use Topics  . Alcohol use: Yes    Comment: Occasional - 1 - 2 glasses wine a month  . Drug use: No    Review of Systems  Constitutional: Negative for chills, fever and unexpected weight change.  HENT: Negative for congestion.   Respiratory: Negative for cough and shortness of breath.   Cardiovascular: Positive for palpitations. Negative for chest pain and leg swelling.  Gastrointestinal: Negative for nausea and vomiting.  Genitourinary: Negative for pelvic pain.  Musculoskeletal: Negative for arthralgias and myalgias.  Skin: Negative for rash.  Neurological: Negative for headaches.  Hematological: Negative for adenopathy.  Psychiatric/Behavioral: Negative for confusion.      Objective:    BP 114/70 (BP Location: Left Arm, Patient Position: Sitting, Cuff Size: Normal)   Pulse 60   Temp 97.7 F (36.5 C) (Oral)   Resp 14   Ht 5\' 3"  (1.6 m)   Wt 147 lb (66.7  kg)   SpO2 99%   BMI 26.04 kg/m   BP Readings from Last 3 Encounters:  05/04/20 114/70  11/27/19 124/78  08/27/19 110/62   Wt Readings from Last 3 Encounters:  05/04/20 147 lb (66.7 kg)  11/27/19 151 lb 2 oz (68.5 kg)  10/09/19 148 lb (67.1 kg)    Physical Exam Vitals reviewed.  Constitutional:      Appearance: She is well-developed.  Eyes:     Conjunctiva/sclera: Conjunctivae normal.  Neck:     Thyroid: No thyroid mass or thyromegaly.  Cardiovascular:     Rate and Rhythm: Normal rate and regular rhythm.     Pulses: Normal pulses.     Heart sounds: Normal heart sounds.  Pulmonary:     Effort: Pulmonary effort is normal.     Breath sounds: Normal  breath sounds. No wheezing, rhonchi or rales.  Chest:     Breasts: Breasts are symmetrical.        Right: No inverted nipple, mass, nipple discharge, skin change or tenderness.        Left: No inverted nipple, mass, nipple discharge, skin change or tenderness.  Lymphadenopathy:     Head:     Right side of head: No submental, submandibular, tonsillar, preauricular, posterior auricular or occipital adenopathy.     Left side of head: No submental, submandibular, tonsillar, preauricular, posterior auricular or occipital adenopathy.     Cervical: No cervical adenopathy.     Right cervical: No superficial, deep or posterior cervical adenopathy.    Left cervical: No superficial, deep or posterior cervical adenopathy.  Skin:    General: Skin is warm and dry.  Neurological:     Mental Status: She is alert.  Psychiatric:        Speech: Speech normal.        Behavior: Behavior normal.        Thought Content: Thought content normal.        Assessment & Plan:   Problem List Items Addressed This Visit      Cardiovascular and Mediastinum   Pericardial effusion    Asymptomatic. Pending rheumatological studies. Will share with Dr End as well      Relevant Orders   ANA   C-reactive protein (Completed)   CYCLIC CITRUL PEPTIDE ANTIBODY, IGG/IGA   Rheumatoid factor   Sedimentation rate (Completed)   PVC's (premature ventricular contractions)    Not burdensome for patient. She has upcoming appointment with Dr End. Will follow      Relevant Orders   TSH (Completed)   CBC with Differential/Platelet (Completed)     Other   Palpitations   Relevant Orders   TSH (Completed)   CBC with Differential/Platelet (Completed)   Routine general medical examination at a health care facility - Primary    Deferred pelvic exam and patient follows with GYN. CBE performed. Patient will schedule mammogram and dexa. Colonoscopy referral placed.       Relevant Orders   ANA   C-reactive protein  (Completed)   CYCLIC CITRUL PEPTIDE ANTIBODY, IGG/IGA   Rheumatoid factor   Sedimentation rate (Completed)   MM 3D SCREEN BREAST BILATERAL   DG Bone Density   Ambulatory referral to Gastroenterology   TSH (Completed)   CBC with Differential/Platelet (Completed)   Comprehensive metabolic panel (Completed)   Lipid panel (Completed)   VITAMIN D 25 Hydroxy (Vit-D Deficiency, Fractures) (Completed)    Other Visit Diagnoses    Asymptomatic menopausal state       Relevant  Orders   ANA   C-reactive protein (Completed)   CYCLIC CITRUL PEPTIDE ANTIBODY, IGG/IGA   Rheumatoid factor   Sedimentation rate (Completed)   MM 3D SCREEN BREAST BILATERAL   DG Bone Density   Ambulatory referral to Gastroenterology   TSH (Completed)   CBC with Differential/Platelet (Completed)   Comprehensive metabolic panel (Completed)   Lipid panel (Completed)   VITAMIN D 25 Hydroxy (Vit-D Deficiency, Fractures) (Completed)   Encounter for lipid screening for cardiovascular disease       Relevant Orders   Lipid panel (Completed)   Encounter for screening mammogram for malignant neoplasm of breast       Relevant Orders   MM 3D SCREEN BREAST BILATERAL       I am having Kelly [name] maintain her cholecalciferol.   No orders of the defined types were placed in this encounter.   Return precautions given.   Risks, benefits, and alternatives of the medications and treatment plan prescribed today were discussed, and patient expressed understanding.   Education regarding symptom management and diagnosis given to patient on AVS.   Continue to follow with Burnard Hawthorne, FNP for routine health maintenance.   Kelly [name] and I agreed with plan.   Mable Paris, FNP

## 2020-05-04 NOTE — Addendum Note (Signed)
Addended by: Lars Masson on: 05/04/2020 05:09 PM   Modules accepted: Orders

## 2020-05-04 NOTE — Assessment & Plan Note (Signed)
Deferred pelvic exam and patient follows with GYN. CBE performed. Patient will schedule mammogram and dexa. Colonoscopy referral placed.

## 2020-05-04 NOTE — Patient Instructions (Signed)
Referral for colonoscopy  Let us know if you dont hear back within a week in regards to an appointment being scheduled.   Schedule mammogram and bone density  Stay safe!   Health Maintenance for Postmenopausal Women Menopause is a normal process in which your ability to get pregnant comes to an end. This process happens slowly over many months or years, usually between the ages of 35 and 78. Menopause is complete when you have missed your menstrual periods for 12 months. It is important to talk with your health care provider about some of the most common conditions that affect women after menopause (postmenopausal women). These include heart disease, cancer, and bone loss (osteoporosis). Adopting a healthy lifestyle and getting preventive care can help to promote your health and wellness. The actions you take can also lower your chances of developing some of these common conditions. What should I know about menopause? During menopause, you may get a number of symptoms, such as:  Hot flashes. These can be moderate or severe.  Night sweats.  Decrease in sex drive.  Mood swings.  Headaches.  Tiredness.  Irritability.  Memory problems.  Insomnia. Choosing to treat or not to treat these symptoms is a decision that you make with your health care provider. Do I need hormone replacement therapy?  Hormone replacement therapy is effective in treating symptoms that are caused by menopause, such as hot flashes and night sweats.  Hormone replacement carries certain risks, especially as you become older. If you are thinking about using estrogen or estrogen with progestin, discuss the benefits and risks with your health care provider. What is my risk for heart disease and stroke? The risk of heart disease, heart attack, and stroke increases as you age. One of the causes may be a change in the body's hormones during menopause. This can affect how your body uses dietary fats, triglycerides, and  cholesterol. Heart attack and stroke are medical emergencies. There are many things that you can do to help prevent heart disease and stroke. Watch your blood pressure  High blood pressure causes heart disease and increases the risk of stroke. This is more likely to develop in people who have high blood pressure readings, are of African descent, or are overweight.  Have your blood pressure checked: ? Every 3-5 years if you are 69-89 years of age. ? Every year if you are 22 years old or older. Eat a healthy diet   Eat a diet that includes plenty of vegetables, fruits, low-fat dairy products, and lean protein.  Do not eat a lot of foods that are high in solid fats, added sugars, or sodium. Get regular exercise Get regular exercise. This is one of the most important things you can do for your health. Most adults should:  Try to exercise for at least 150 minutes each week. The exercise should increase your heart rate and make you sweat (moderate-intensity exercise).  Try to do strengthening exercises at least twice each week. Do these in addition to the moderate-intensity exercise.  Spend less time sitting. Even light physical activity can be beneficial. Other tips  Work with your health care provider to achieve or maintain a healthy weight.  Do not use any products that contain nicotine or tobacco, such as cigarettes, e-cigarettes, and chewing tobacco. If you need help quitting, ask your health care provider.  Know your numbers. Ask your health care provider to check your cholesterol and your blood sugar (glucose). Continue to have your blood tested as  directed by your health care provider. Do I need screening for cancer? Depending on your health history and family history, you may need to have cancer screening at different stages of your life. This may include screening for:  Breast cancer.  Cervical cancer.  Lung cancer.  Colorectal cancer. What is my risk for  osteoporosis? After menopause, you may be at increased risk for osteoporosis. Osteoporosis is a condition in which bone destruction happens more quickly than new bone creation. To help prevent osteoporosis or the bone fractures that can happen because of osteoporosis, you may take the following actions:  If you are 66-7 years old, get at least 1,000 mg of calcium and at least 600 mg of vitamin D per day.  If you are older than age 61 but younger than age 99, get at least 1,200 mg of calcium and at least 600 mg of vitamin D per day.  If you are older than age 40, get at least 1,200 mg of calcium and at least 800 mg of vitamin D per day. Smoking and drinking excessive alcohol increase the risk of osteoporosis. Eat foods that are rich in calcium and vitamin D, and do weight-bearing exercises several times each week as directed by your health care provider. How does menopause affect my mental health? Depression may occur at any age, but it is more common as you become older. Common symptoms of depression include:  Low or sad mood.  Changes in sleep patterns.  Changes in appetite or eating patterns.  Feeling an overall lack of motivation or enjoyment of activities that you previously enjoyed.  Frequent crying spells. Talk with your health care provider if you think that you are experiencing depression. General instructions See your health care provider for regular wellness exams and vaccines. This may include:  Scheduling regular health, dental, and eye exams.  Getting and maintaining your vaccines. These include: ? Influenza vaccine. Get this vaccine each year before the flu season begins. ? Pneumonia vaccine. ? Shingles vaccine. ? Tetanus, diphtheria, and pertussis (Tdap) booster vaccine. Your health care provider may also recommend other immunizations. Tell your health care provider if you have ever been abused or do not feel safe at home. Summary  Menopause is a normal process in  which your ability to get pregnant comes to an end.  This condition causes hot flashes, night sweats, decreased interest in sex, mood swings, headaches, or lack of sleep.  Treatment for this condition may include hormone replacement therapy.  Take actions to keep yourself healthy, including exercising regularly, eating a healthy diet, watching your weight, and checking your blood pressure and blood sugar levels.  Get screened for cancer and depression. Make sure that you are up to date with all your vaccines. This information is not intended to replace advice given to you by your health care provider. Make sure you discuss any questions you have with your health care provider. Document Revised: 08/28/2018 Document Reviewed: 08/28/2018 Elsevier Patient Education  2020 [employer].

## 2020-05-04 NOTE — Assessment & Plan Note (Signed)
Asymptomatic. Pending rheumatological studies. Will share with Dr End as well

## 2020-05-04 NOTE — Assessment & Plan Note (Signed)
Not burdensome for patient. She has upcoming appointment with Dr End. Will follow

## 2020-05-05 LAB — RHEUMATOID FACTOR: Rheumatoid fact SerPl-aCnc: <14 IU/mL (ref <14)

## 2020-05-05 LAB — ANA: Anti Nuclear Antibody (ANA): NEGATIVE

## 2020-05-06 LAB — CYCLIC CITRUL PEPTIDE ANTIBODY, IGG/IGA: Cyclic Citrullin Peptide Ab: 4 units (ref 0–19)

## 2020-06-08 NOTE — Progress Notes (Signed)
Follow-up Outpatient Visit Date: 06/09/2020  Primary Care Provider: Burnard Hawthorne, FNP [address] [place] [address]  Chief Complaint: Follow-up PVCs and pericardial effusion  HPI:  Kelly [name] is a 64 y.o. female with history of chronic small pericardial effusion, frequent PVCs, pancreatitis, shingles, and claustrophobia, who presents for follow-up of PVCs.  I last saw her in March, at which time she reported fewer palpitations.  She attributed her improvement in palpitations to better sleep.  She noted that she snores at night and had never undergone sleep evaluation.  We discussed referral for sleep study as well as pharmacotherapy for management of her PVCs.  However, Kelly [name] elected to defer further testing and intervention.  Autoimmune testing including CRP, sedimentation rate, and ANA were negative as part of work-up for pericardial effusion.  Today, Kelly [name] reports that she has been feeling well.  She retired earlier this month and feels like decrease stress has helped with her palpitations/PVCs.  He has she has not noticed any significant palpitations over the last few weeks.  She denies chest pain, shortness of breath, palpitations, and lightheadedness.  She recently traveled to [place] and experienced 1 day of headaches that she attributes to being at high elevation.  Kelly [name] notes occasional dependent edema.  Kelly [name] notes that she continues to snore from time to time.  She typically does not feel tired during the day.  She is well rested when she awakens in the morning.  --------------------------------------------------------------------------------------------------  Cardiovascular History & Procedures: Cardiovascular Problems:  Palpitations  Pericardial effusion  Risk Factors:  Age greater than 40  Cath/PCI:  None  CV Surgery:  None  EP Procedures and Devices:  3-day event monitor (08/27/2019): Predominantly  sinus rhythm with rare PACs and frequent PVCs (PVC burden 14.5%). Single episode of brief PSVT also noted.  24-hour Holter monitor (11/13/2016): Rare PACs and PVCs. Single 3 beat atrial run. No sustained arrhythmia or prolonged pause.  Non-Invasive Evaluation(s):  Pharmacologic MPI (10/31/2019): Low risk study without ischemia or scar.  No coronary artery calcification.  LVEF 69%.  PVCs noted during study.  TTE (08/21/2019): Normal LV size and wall thickness.  LVEF 60-65% with grade 2 diastolic dysfunction.  Normal RV size and function.  Mild left atrial enlargement.  Mild to moderate tricuspid regurgitation.  Upper normal PA pressure.  Mildly elevated CVP.  Small pericardial effusion.  Limited TTE (04/03/2017): Upper normal LV size with normal wall thickness. LVEF 60-65% with normal wall motion. Small pericardial effusion, stable to minimally increased in size from 10/2016.  TTE (11/15/2016): Normal LV size with upper normal wall thickness. LVEF 55-60% with normal wall motion and grade 1 diastolic dysfunction. Mild aortic and tricuspid regurgitation. Normal RV size and function dilated IVC. Trivial pericardial effusion.  Recent CV Pertinent Labs: Lab Results  Component Value Date   CHOL 269 (H) 05/04/2020   HDL 90.00 05/04/2020   LDLCALC 165 (H) 05/04/2020   LDLDIRECT 151.3 10/19/2011   TRIG 71.0 05/04/2020   CHOLHDL 3 05/04/2020   K 4.3 05/04/2020   K 3.7 09/20/2014   MG 2.1 07/17/2019   BUN 17 05/04/2020   BUN 15 07/17/2019   BUN 15 09/20/2014   CREATININE 0.69 05/04/2020   CREATININE 0.64 09/20/2014    Past medical and surgical history were reviewed and updated in EPIC.  Current Meds  Medication Sig  . cholecalciferol (VITAMIN D) 1000 UNITS tablet Take 1,000 Units by mouth daily.    Allergies: Other, Pantoprazole  sodium, Pollen extract, and Prednisolone  Social History   Tobacco Use  . Smoking status: Never Smoker  . Smokeless tobacco: Never Used  Vaping Use  .  Vaping Use: Never used  Substance Use Topics  . Alcohol use: Yes    Comment: Occasional - 1 - 2 glasses wine a month  . Drug use: No    Family History  Problem Relation Age of Onset  . Colon cancer Mother 35  . Lymphoma Mother   . Cancer Mother 70       lymphoma and colon  . Heart disease Father 20  . Multiple sclerosis Sister   . Depression Sister   . Diabetes Sister   . Breast cancer Maternal Grandmother   . Coronary artery disease Other   . Heart attack Other        40's   . Sudden Cardiac Death Other     --------------------------------------------------------------------------------------------------  Physical Exam: BP 100/70 (BP Location: Left Arm, Patient Position: Sitting, Cuff Size: Normal)   Pulse 78   Ht 5\' 3"  (1.6 m)   Wt 144 lb 6 oz (65.5 kg)   SpO2 97%   BMI 25.57 kg/m   General: NAD. Neck: No JVD or HJR. Lungs: Clear to auscultation without wheeze or crackles. Heart: Regular rate and rhythm without murmurs, rubs, or gallops. Abdomen: Soft, nontender, nondistended. Extremities: Trace pretibial edema.  EKG: Normal sinus rhythm without abnormality.  Lab Results  Component Value Date   WBC 5.4 05/04/2020   HGB 15.0 05/04/2020   HCT 44.5 05/04/2020   MCV 99.3 05/04/2020   PLT 178.0 05/04/2020    Lab Results  Component Value Date   NA 140 05/04/2020   K 4.3 05/04/2020   CL 102 05/04/2020   CO2 30 05/04/2020   BUN 17 05/04/2020   CREATININE 0.69 05/04/2020   GLUCOSE 93 05/04/2020   ALT 21 05/04/2020    Lab Results  Component Value Date   CHOL 269 (H) 05/04/2020   HDL 90.00 05/04/2020   LDLCALC 165 (H) 05/04/2020   LDLDIRECT 151.3 10/19/2011   TRIG 71.0 05/04/2020   CHOLHDL 3 05/04/2020    --------------------------------------------------------------------------------------------------  ASSESSMENT AND PLAN: PVCs: Prior cardiac monitoring showed frequent PVCs, which have improved from a symptom standpoint.  No PVCs are noted on EKG  today nor by physical exam.  Is good she has previously declined pharmacotherapy.  Given the lack of symptoms with preserved LVEF, I think it is reasonable to defer adding a medication today.  We readdressed sleep study in the setting of snoring and have also agreed to defer this indefinitely.  Pericardial effusion: No signs or symptoms of heart failure are present today.  Effusion noted to be chronic and small by prior echocardiograms.  Recent autoimmune work-up was unrevealing.  No further work-up is recommended at this time.  Follow-up: Return to clinic in 1 year.  Nelva Bush, MD 06/09/2020 8:44 PM

## 2020-06-09 ENCOUNTER — Ambulatory Visit (INDEPENDENT_AMBULATORY_CARE_PROVIDER_SITE_OTHER): Payer: Medicare Other | Admitting: Internal Medicine

## 2020-06-09 ENCOUNTER — Encounter: Payer: Self-pay | Admitting: Internal Medicine

## 2020-06-09 ENCOUNTER — Encounter: Payer: Self-pay | Admitting: Family

## 2020-06-09 ENCOUNTER — Other Ambulatory Visit: Payer: Self-pay

## 2020-06-09 VITALS — BP 100/70 | HR 78 | Ht 63.0 in | Wt 144.4 lb

## 2020-06-09 DIAGNOSIS — I313 Pericardial effusion (noninflammatory): Secondary | ICD-10-CM

## 2020-06-09 DIAGNOSIS — I3139 Other pericardial effusion (noninflammatory): Secondary | ICD-10-CM

## 2020-06-09 DIAGNOSIS — I493 Ventricular premature depolarization: Secondary | ICD-10-CM

## 2020-06-09 NOTE — Patient Instructions (Signed)
Medication Instructions:  Your physician recommends that you continue on your current medications as directed. Please refer to the Current Medication list given to you today.  *If you need a refill on your cardiac medications before your next appointment, please call your pharmacy*  Follow-Up: At [facility], you and your health needs are our priority.  As part of our continuing mission to provide you with exceptional heart care, we have created designated Provider Care Teams.  These Care Teams include your primary Cardiologist (physician) and Advanced Practice Providers (APPs -  Physician Assistants and Nurse Practitioners) who all work together to provide you with the care you need, when you need it.  We recommend signing up for the patient portal called "MyChart".  Sign up information is provided on this After Visit Summary.  MyChart is used to connect with patients for Virtual Visits (Telemedicine).  Patients are able to view lab/test results, encounter notes, upcoming appointments, etc.  Non-urgent messages can be sent to your provider as well.   To learn more about what you can do with MyChart, go to [email or web address].    Your next appointment:   12 month(s)  The format for your next appointment:   In Person  Provider:   You may see DR Harrell Gave END  or one of the following Advanced Practice Providers on your designated Care Team:    Murray Hodgkins, NP  Christell Faith, PA-C  Marrianne Mood, PA-C  Cadence [place], [place]

## 2020-06-30 ENCOUNTER — Telehealth: Payer: Self-pay | Admitting: Family

## 2020-06-30 NOTE — Telephone Encounter (Signed)
Left message for patient to call back and schedule Medicare Annual Wellness Visit (AWV)  ° °This should be a telephone visit only=30 minutes. ° °No hx of AWV; please schedule at anytime with Denisa O'Brien-Blaney at [facility] [place] ° ° °

## 2020-07-06 ENCOUNTER — Telehealth: Payer: Self-pay | Admitting: Family

## 2020-07-06 NOTE — Telephone Encounter (Signed)
Patient called in with dates for her vaccines : 1st 09-15-19  ,2nd 10-09-19, booster 06-21-20 all were [employer]

## 2020-07-06 NOTE — Telephone Encounter (Signed)
I have documented in patient's chart. 

## 2020-07-13 ENCOUNTER — Telehealth: Payer: Self-pay | Admitting: Family

## 2020-07-13 NOTE — Telephone Encounter (Signed)
Rejection Reason - Other - Per OA nurse, patient is not due for a colonoscopy until December 2023. Letter has been mailed to the patient." [facility] said on Jul 13, 2020 10:43 AM

## 2020-07-14 ENCOUNTER — Other Ambulatory Visit: Payer: Self-pay | Admitting: Family

## 2020-07-14 ENCOUNTER — Ambulatory Visit (INDEPENDENT_AMBULATORY_CARE_PROVIDER_SITE_OTHER): Payer: Medicare Other

## 2020-07-14 VITALS — Ht 63.0 in | Wt 144.0 lb

## 2020-07-14 DIAGNOSIS — Z1231 Encounter for screening mammogram for malignant neoplasm of breast: Secondary | ICD-10-CM | POA: Diagnosis not present

## 2020-07-14 DIAGNOSIS — Z0000 Encounter for general adult medical examination without abnormal findings: Secondary | ICD-10-CM

## 2020-07-14 DIAGNOSIS — Z780 Asymptomatic menopausal state: Secondary | ICD-10-CM

## 2020-07-14 NOTE — Patient Instructions (Addendum)
[name] , Thank you for taking time to come for your Medicare Wellness Visit. I appreciate your ongoing commitment to your health goals. Please review the following plan we discussed and let me know if I can assist you in the future.   These are the goals we discussed: Goals    . Maintain Healthy Lifestyle     Stay active Healthy diet       This is a list of the screening recommended for you and due dates:  Health Maintenance  Topic Date Due  . Flu Shot  08/04/2020*  . Mammogram  04/30/2021  . Colon Cancer Screening  08/30/2022  . Tetanus Vaccine  01/30/2029  . DEXA scan (bone density measurement)  Completed  . COVID-19 Vaccine  Completed  .  Hepatitis C: One time screening is recommended by Center for Disease Control  (CDC) for  adults born from [birth year] through 1967.   Completed  . Pneumonia vaccines  Completed  *Topic was postponed. The date shown is not the original due date.    Immunizations Immunization History  Administered Date(s) Administered  . DTaP 01/31/2019  . Influenza Split 07/18/2011  . Influenza-Unspecified 08/06/2013, 06/25/2015  . PFIZER SARS-COV-2 Vaccination 09/15/2019, 10/09/2019, 06/21/2020  . Pneumococcal Conjugate-13 04/07/2019  . Pneumococcal Polysaccharide-23 05/04/2020  . Tdap 09/18/2009, 01/31/2019   Advanced directives:   Conditions/risks identified: none new.  Follow up in one year for your annual wellness visit.  Dexa Scan reordered for [facility]. Facility to call and schedule.   Mammogram 3D SCREEN BREAST BILATERAL - 08/03/20  Preventive Care 65 Years and Older, Female Preventive care refers to lifestyle choices and visits with your health care provider that can promote health and wellness. What does preventive care include?  A yearly physical exam. This is also called an annual well check.  Dental exams once or twice a year.  Routine eye exams. Ask your health care provider how often you should have your eyes  checked.  Personal lifestyle choices, including:  Daily care of your teeth and gums.  Regular physical activity.  Eating a healthy diet.  Avoiding tobacco and drug use.  Limiting alcohol use.  Practicing safe sex.  Taking low-dose aspirin every day.  Taking vitamin and mineral supplements as recommended by your health care provider. What happens during an annual well check? The services and screenings done by your health care provider during your annual well check will depend on your age, overall health, lifestyle risk factors, and family history of disease. Counseling  Your health care provider may ask you questions about your:  Alcohol use.  Tobacco use.  Drug use.  Emotional well-being.  Home and relationship well-being.  Sexual activity.  Eating habits.  History of falls.  Memory and ability to understand (cognition).  Work and work Statistician.  Reproductive health. Screening  You may have the following tests or measurements:  Height, weight, and BMI.  Blood pressure.  Lipid and cholesterol levels. These may be checked every 5 years, or more frequently if you are over 80 years old.  Skin check.  Lung cancer screening. You may have this screening every year starting at age 48 if you have a 30-pack-year history of smoking and currently smoke or have quit within the past 15 years.  Fecal occult blood test (FOBT) of the stool. You may have this test every year starting at age 7.  Flexible sigmoidoscopy or colonoscopy. You may have a sigmoidoscopy every 5 years or a colonoscopy every 10  years starting at age 35.  Hepatitis C blood test.  Hepatitis B blood test.  Sexually transmitted disease (STD) testing.  Diabetes screening. This is done by checking your blood sugar (glucose) after you have not eaten for a while (fasting). You may have this done every 1-3 years.  Bone density scan. This is done to screen for osteoporosis. You may have this done  starting at age 25.  Mammogram. This may be done every 1-2 years. Talk to your health care provider about how often you should have regular mammograms. Talk with your health care provider about your test results, treatment options, and if necessary, the need for more tests. Vaccines  Your health care provider may recommend certain vaccines, such as:  Influenza vaccine. This is recommended every year.  Tetanus, diphtheria, and acellular pertussis (Tdap, Td) vaccine. You may need a Td booster every 10 years.  Zoster vaccine. You may need this after age 68.  Pneumococcal 13-valent conjugate (PCV13) vaccine. One dose is recommended after age 68.  Pneumococcal polysaccharide (PPSV23) vaccine. One dose is recommended after age 32. Talk to your health care provider about which screenings and vaccines you need and how often you need them. This information is not intended to replace advice given to you by your health care provider. Make sure you discuss any questions you have with your health care provider. Document Released: 10/01/2015 Document Revised: 05/24/2016 Document Reviewed: 07/06/2015 Elsevier Interactive Patient Education  2017 [place] Prevention in the Home Falls can cause injuries. They can happen to people of all ages. There are many things you can do to make your home safe and to help prevent falls. What can I do on the outside of my home?  Regularly fix the edges of walkways and driveways and fix any cracks.  Remove anything that might make you trip as you walk through a door, such as a raised step or threshold.  Trim any bushes or trees on the path to your home.  Use bright outdoor lighting.  Clear any walking paths of anything that might make someone trip, such as rocks or tools.  Regularly check to see if handrails are loose or broken. Make sure that both sides of any steps have handrails.  Any raised decks and porches should have guardrails on the  edges.  Have any leaves, snow, or ice cleared regularly.  Use sand or salt on walking paths during winter.  Clean up any spills in your garage right away. This includes oil or grease spills. What can I do in the bathroom?  Use night lights.  Install grab bars by the toilet and in the tub and shower. Do not use towel bars as grab bars.  Use non-skid mats or decals in the tub or shower.  If you need to sit down in the shower, use a plastic, non-slip stool.  Keep the floor dry. Clean up any water that spills on the floor as soon as it happens.  Remove soap buildup in the tub or shower regularly.  Attach bath mats securely with double-sided non-slip rug tape.  Do not have throw rugs and other things on the floor that can make you trip. What can I do in the bedroom?  Use night lights.  Make sure that you have a light by your bed that is easy to reach.  Do not use any sheets or blankets that are too big for your bed. They should not hang down onto the floor.  Have  a firm chair that has side arms. You can use this for support while you get dressed.  Do not have throw rugs and other things on the floor that can make you trip. What can I do in the kitchen?  Clean up any spills right away.  Avoid walking on wet floors.  Keep items that you use a lot in easy-to-reach places.  If you need to reach something above you, use a strong step stool that has a grab bar.  Keep electrical cords out of the way.  Do not use floor polish or wax that makes floors slippery. If you must use wax, use non-skid floor wax.  Do not have throw rugs and other things on the floor that can make you trip. What can I do with my stairs?  Do not leave any items on the stairs.  Make sure that there are handrails on both sides of the stairs and use them. Fix handrails that are broken or loose. Make sure that handrails are as long as the stairways.  Check any carpeting to make sure that it is firmly  attached to the stairs. Fix any carpet that is loose or worn.  Avoid having throw rugs at the top or bottom of the stairs. If you do have throw rugs, attach them to the floor with carpet tape.  Make sure that you have a light switch at the top of the stairs and the bottom of the stairs. If you do not have them, ask someone to add them for you. What else can I do to help prevent falls?  Wear shoes that:  Do not have high heels.  Have rubber bottoms.  Are comfortable and fit you well.  Are closed at the toe. Do not wear sandals.  If you use a stepladder:  Make sure that it is fully opened. Do not climb a closed stepladder.  Make sure that both sides of the stepladder are locked into place.  Ask someone to hold it for you, if possible.  Clearly mark and make sure that you can see:  Any grab bars or handrails.  First and last steps.  Where the edge of each step is.  Use tools that help you move around (mobility aids) if they are needed. These include:  Canes.  Walkers.  Scooters.  Crutches.  Turn on the lights when you go into a dark area. Replace any light bulbs as soon as they burn out.  Set up your furniture so you have a clear path. Avoid moving your furniture around.  If any of your floors are uneven, fix them.  If there are any pets around you, be aware of where they are.  Review your medicines with your doctor. Some medicines can make you feel dizzy. This can increase your chance of falling. Ask your doctor what other things that you can do to help prevent falls. This information is not intended to replace advice given to you by your health care provider. Make sure you discuss any questions you have with your health care provider. Document Released: 07/01/2009 Document Revised: 02/10/2016 Document Reviewed: 10/09/2014 Elsevier Interactive Patient Education  2017 Elsevier Inc.  Bone Density Test The bone density test uses a special type of X-ray to measure  the amount of calcium and other minerals in your bones. It can measure bone density in the hip and the spine. The test procedure is similar to having a regular X-ray. This test may also be called:  Bone densitometry.  Bone mineral density test.  Dual-energy X-ray absorptiometry (DEXA). You may have this test to:  Diagnose a condition that causes weak or thin bones (osteoporosis).  Screen you for osteoporosis.  Predict your risk for a broken bone (fracture).  Determine how well your osteoporosis treatment is working. Tell a health care provider about:  Any allergies you have.  All medicines you are taking, including vitamins, herbs, eye drops, creams, and over-the-counter medicines.  Any problems you or family members have had with anesthetic medicines.  Any blood disorders you have.  Any surgeries you have had.  Any medical conditions you have.  Whether you are pregnant or may be pregnant.  Any medical tests you have had within the past 14 days that used contrast material. What are the risks? Generally, this is a safe procedure. However, it does expose you to a small amount of radiation, which can slightly increase your cancer risk. What happens before the procedure?  Do not take any calcium supplements starting 24 hours before your test.  Remove all metal jewelry, eyeglasses, dental appliances, and any other metal objects. What happens during the procedure?   You will lie down on an exam table. There will be an X-ray generator below you and an imaging device above you.  Other devices, such as boxes or braces, may be used to position your body properly for the scan.  The machine will slowly scan your body. You will need to keep still.  The images will show up on a screen in the room. Images will be examined by a specialist after your test is done. The procedure may vary among health care providers and hospitals. What happens after the procedure?  It is up to you  to get your test results. Ask your health care provider, or the department that is doing the test, when your results will be ready. Summary  A bone density test is an imaging test that uses a type of X-ray to measure the amount of calcium and other minerals in your bones.  The test may be used to diagnose or screen you for a condition that causes weak or thin bones (osteoporosis), predict your risk for a broken bone (fracture), or determine how well your osteoporosis treatment is working.  Do not take any calcium supplements starting 24 hours before your test.  Ask your health care provider, or the department that is doing the test, when your results will be ready. This information is not intended to replace advice given to you by your health care provider. Make sure you discuss any questions you have with your health care provider. Document Revised: 09/20/2017 Document Reviewed: 07/09/2017 Elsevier Patient Education  [place].

## 2020-07-14 NOTE — Progress Notes (Addendum)
Subjective:   Kelly [name] is a 64 y.o. female who presents for an Initial Medicare Annual Wellness Visit.  Review of Systems    No ROS.  Medicare Wellness Virtual Visit.  Cardiac Risk Factors include: advanced age (>28men, >51 women)     Objective:    Today's Vitals   07/14/20 0932  Weight: 144 lb (65.3 kg)  Height: 5\' 3"  (1.6 m)   Body mass index is 25.51 kg/m.  Advanced Directives 07/14/2020 08/30/2017  Does Patient Have a Medical Advance Directive? No Yes  Type of Advance Directive - Living will    Current Medications (verified) Outpatient Encounter Medications as of 07/14/2020  Medication Sig   cholecalciferol (VITAMIN D) 1000 UNITS tablet Take 1,000 Units by mouth daily.   No facility-administered encounter medications on file as of 07/14/2020.    Allergies (verified) Other, Pantoprazole sodium, Pollen extract, and Prednisolone   History: Past Medical History:  Diagnosis Date   Claustrophobia    Pancreatitis 2009, 2005   Shingles    Shingles outbreak 2006   Past Surgical History:  Procedure Laterality Date   BREAST BIOPSY     Nml per pt , f/u mammo 10/2011   CHOLECYSTECTOMY     COLONOSCOPY WITH PROPOFOL N/A 08/30/2017   Procedure: COLONOSCOPY WITH PROPOFOL;  Surgeon: Lollie Sails, MD;  [facility] END[facility];  Service: Endoscopy;  Laterality: N/A;   EXPLORATORY LAPAROTOMY     VAGINAL DELIVERY  1994   Family History  Problem Relation Age of Onset   Colon cancer Mother 26   Lymphoma Mother    Cancer Mother 56       lymphoma and colon   Heart disease Father 7   Multiple sclerosis Sister    Depression Sister    Diabetes Sister    Breast cancer Maternal Grandmother    Coronary artery disease Other    Heart attack Other        39's    Sudden Cardiac Death Other    Social History   Socioeconomic History   Marital status: Married    Spouse name: Not on file   Number of children: 1    Years of education:  Not on file   Highest education level: Not on file  Occupational History   Occupation: Nurse Midwife - [employer]   Tobacco Use   Smoking status: Never Smoker   Smokeless tobacco: Never Used  Scientific laboratory technician Use: Never used  Substance and Sexual Activity   Alcohol use: Yes    Comment: Occasional - 1 - 2 glasses wine a month   Drug use: No   Sexual activity: Not on file  Other Topics Concern   Not on file  Social History Narrative   Social research officer, government until [employer]   Social Determinants of Health   Financial Resource Strain: Low Risk    Difficulty of Paying Living Expenses: Not hard at all  Food Insecurity: No Food Insecurity   Worried About Charity fundraiser in the Last Year: Never true   Arboriculturist in the Last Year: Never true  Transportation Needs: No Transportation Needs   Lack of Transportation (Medical): No   Lack of Transportation (Non-Medical): No  Physical Activity: Sufficiently Active   Days of Exercise per Week: 3 days   Minutes of Exercise per Session: 60 min  Stress: No Stress Concern Present   Feeling of Stress : Not at all  Social Connections: Unknown   Frequency of  Communication with Friends and Family: More than three times a week   Frequency of Social Gatherings with Friends and Family: More than three times a week   Attends Religious Services: Not on Electrical engineer or Organizations: Not on file   Attends Archivist Meetings: Not on file   Marital Status: Married    Tobacco Counseling Counseling given: Not Answered   Clinical Intake:  Pre-visit preparation completed: Yes        Diabetes: Yes  How often do you need to have someone help you when you read instructions, pamphlets, or other written materials from your doctor or pharmacy?: 1 - Never  Interpreter Needed?: No      Activities of Daily Living In your present state of health, do you have any difficulty performing the following  activities: 07/14/2020  Hearing? N  Vision? N  Difficulty concentrating or making decisions? N  Walking or climbing stairs? N  Dressing or bathing? N  Doing errands, shopping? N  Preparing Food and eating ? N  Using the Toilet? N  In the past six months, have you accidently leaked urine? N  Do you have problems with loss of bowel control? N  Managing your Medications? N  Managing your Finances? N  Housekeeping or managing your Housekeeping? N  Some recent data might be hidden    Patient Care Team: Burnard Hawthorne, FNP as PCP - General (Family Medicine) End, Harrell Gave, MD as Consulting Physician (Cardiology)  Indicate any recent Medical Services you may have received from other than [facility] providers in the past year (date may be approximate).     Assessment:   This is a routine wellness examination for [place].  I connected with Tyeasha today by telephone and verified that I am speaking with the correct person using two identifiers. Location patient: home Location provider: work Persons participating in the virtual visit: patient, Marine scientist.    I discussed the limitations, risks, security and privacy concerns of performing an evaluation and management service by telephone and the availability of in person appointments. The patient expressed understanding and verbally consented to this telephonic visit.    Interactive audio and video telecommunications were attempted between this provider and patient, however failed, due to patient having technical difficulties OR patient did not have access to video capability.  We continued and completed visit with audio only.  Some vital signs may be absent or patient reported.   Hearing/Vision screen  Hearing Screening   125Hz  250Hz  500Hz  1000Hz  2000Hz  3000Hz  4000Hz  6000Hz  8000Hz   Right ear:           Left ear:           Comments: Patient is able to hear conversational tones without difficulty.  No issues reported.   Vision Screening  Comments: Wears corrective lenses Visual acuity not assessed, virtual visit.  They have seen their ophthalmologist in the last 12 months.     Dietary issues and exercise activities discussed: Current Exercise Habits: Home exercise routine, Time (Minutes): 60, Frequency (Times/Week): 3, Weekly Exercise (Minutes/Week): 180, Intensity: Moderate  Goals     Maintain Healthy Lifestyle     Stay active Healthy diet      Depression Screen PHQ 2/9 Scores 07/14/2020 07/03/2019 02/18/2018 01/31/2017 12/07/2015  PHQ - 2 Score 0 0 0 0 0  PHQ- 9 Score - 0 1 - -    Fall Risk Fall Risk  07/14/2020 05/04/2020 07/03/2019 01/31/2017  Falls in the past year?  0 0 0 No  Number falls in past yr: 0 - 0 -  Injury with Fall? - - 0 -  Follow up Falls evaluation completed Falls evaluation completed Falls evaluation completed -   Handrails in use when climbing stairs? Yes Home free of loose throw rugs in walkways, pet beds, electrical cords, etc? Yes  Adequate lighting in your home to reduce risk of falls? Yes   ASSISTIVE DEVICES UTILIZED TO PREVENT FALLS: Life alert? No  Use of a cane, walker or w/c? No   TIMED UP AND GO: Was the test performed? No . Virtual visit.  Cognitive Function:  Patient is alert and oriented x3.  Denies difficulty focusing, making decisions, memory loss.    6CIT Screen 07/14/2020  What Year? 0 points  What month? 0 points  What time? 0 points  Count back from 20 0 points  Months in reverse 0 points   Immunizations Immunization History  Administered Date(s) Administered   DTaP 01/31/2019   Influenza Split 07/18/2011   Influenza-Unspecified 08/06/2013, 06/25/2015   PFIZER SARS-COV-2 Vaccination 09/15/2019, 10/09/2019, 06/21/2020   Pneumococcal Conjugate-13 04/07/2019   Pneumococcal Polysaccharide-23 05/04/2020   Tdap 09/18/2009, 01/31/2019    Health Maintenance There are no preventive care reminders to display for this patient. Health Maintenance  Topic  Date Due   INFLUENZA VACCINE  08/04/2020 (Originally 04/18/2020)   MAMMOGRAM  04/30/2021   COLONOSCOPY  08/30/2022   TETANUS/TDAP  01/30/2029   DEXA SCAN  Completed   COVID-19 Vaccine  Completed   Hepatitis C Screening  Completed   PNA vac Low Risk Adult  Completed   Dental Screening: Recommended annual dental exams for proper oral hygiene. Visits every 3-6 months.   Mammogram- scheduled with [facility]  Dexa Scan- order placed with [facility] per patient preference.   Influenza vaccine- scheduled 07/21/20.  Community Resource Referral / Chronic Care Management: CRR required this visit?  No   CCM required this visit?  No      Plan:   Keep all routine maintenance appointments.   -Mammogram 3D SCREEN BREAST BILATERAL - 08/03/20  -Dexa Scan reordered for [place] [facility]. Facility to call and schedule.   I have personally reviewed and noted the following in the patients chart:    Medical and social history  Use of alcohol, tobacco or illicit drugs   Current medications and supplements  Functional ability and status  Nutritional status  Physical activity  Advanced directives  List of other physicians  Hospitalizations, surgeries, and ER visits in previous 12 months  Vitals  Screenings to include cognitive, depression, and falls  Referrals and appointments  In addition, I have reviewed and discussed with patient certain preventive protocols, quality metrics, and best practice recommendations. A written personalized care plan for preventive services as well as general preventive health recommendations were provided to patient via mychart.     Varney Biles, LPN   27/11/5007     Agree with plan. Mable Paris, NP

## 2020-07-15 NOTE — Telephone Encounter (Signed)
noted 

## 2020-08-03 LAB — HM MAMMOGRAPHY

## 2020-08-04 ENCOUNTER — Telehealth: Payer: Self-pay | Admitting: Family

## 2020-08-04 NOTE — Telephone Encounter (Signed)
Call pt Mammogram normal Repeat in 1 year

## 2020-08-04 NOTE — Telephone Encounter (Signed)
Left detailed message on VM with result; okay per DPR. Advised to call back if she has any questions.

## 2020-09-23 ENCOUNTER — Other Ambulatory Visit: Payer: Medicare Other

## 2020-09-23 DIAGNOSIS — Z20822 Contact with and (suspected) exposure to covid-19: Secondary | ICD-10-CM

## 2020-09-24 ENCOUNTER — Other Ambulatory Visit: Payer: Medicare Other

## 2020-09-27 LAB — NOVEL CORONAVIRUS, NAA: SARS-CoV-2, NAA: NOT DETECTED

## 2020-10-12 ENCOUNTER — Telehealth: Payer: Self-pay | Admitting: Family

## 2020-10-12 NOTE — Telephone Encounter (Signed)
Please mail to patient  Mrs. Danise Mina,  We received your bone density report from [facility] 09/28/20.  Your dexa scan shows osteopenia. Your fracture risk for the next 10 years is 1% for your hip and 9.2% for major osteoporotic fracture.   We need to follow this and monitor again in 2 more years to see if improved or worsened.  So you are aware, guidelines support medical therapies  if one of following:  . A hip or vertebral (clinical or morphometric) fracture  . T-score ? -2.5 at the femoral neck or spine after appropriate evaluation to exclude secondary causes  . Low bone mass (T-score between -1.0 and -2.5 at the femoral neck or spine) and a 10-year probability of a hip fracture ? 3% or a 10-year probability of a major osteoporosis-related fracture ? 20% based on the [place]-adapted WHO algorithm  . Clinicians judgment and/or patient preferences may indicate treatment for people with 10-year fracture probabilities above or below these levels For post menopausal women, guidelines recommend a diet with 1200 mg of Calcium per day. If you are eating calcium rich foods, you do not need a calcium supplement. The body better absorbs the calcium that you eat over supplementation. If you do supplement, I recommend not supplementing the full 1200 mg/ day as this can lead to increased risk of cardiovascular disease. I recommend Calcium Citrate over the counter, and you may take a total of 600 to 800 mg per day in divided doses with meals for best absorption.   For bone health, you need adequate vitamin D, and I recommend you supplement as it is harder to do so with diet alone. I recommend cholecalciferol 800 units daily.  Also, please ensure you are following a diet high in calcium -- research shows better outcomes with dietary sources including kale, yogurt, broccolii, cheese, okra, almonds- to name a few.     Also remember that exercise is a great medicine for maintain and preserve bone health. Advise moderate  exercise for 30 minutes , 3 times per week.    Catalina Antigua, NP

## 2020-10-12 NOTE — Telephone Encounter (Signed)
Letter mailed to patient.

## 2021-04-13 ENCOUNTER — Telehealth: Payer: Self-pay | Admitting: Family

## 2021-04-13 NOTE — Telephone Encounter (Signed)
Pt called she tested positive for covid today. She is having nasal congestion an chills. She wanted to know if she would qualify for the pavloid

## 2021-04-13 NOTE — Telephone Encounter (Signed)
I called patient who was requesting possible anti-viral Paxlovid for Covid. She tested positive as of today & main symptom as of now is just fatigue. Pt is scheduled for VV with Allie Bossier, NP at [facility] tomorrow at 66.

## 2021-04-14 ENCOUNTER — Telehealth (INDEPENDENT_AMBULATORY_CARE_PROVIDER_SITE_OTHER): Payer: Medicare Other | Admitting: Family Medicine

## 2021-04-14 ENCOUNTER — Encounter: Payer: Self-pay | Admitting: Family Medicine

## 2021-04-14 VITALS — BP 126/72 | HR 86 | Temp 97.9°F | Wt 140.0 lb

## 2021-04-14 DIAGNOSIS — U071 COVID-19: Secondary | ICD-10-CM | POA: Diagnosis not present

## 2021-04-14 MED ORDER — NIRMATRELVIR/RITONAVIR (PAXLOVID)TABLET: 3.0000 | ORAL_TABLET | Freq: Two times a day (BID) | ORAL | 0 refills | Status: AC

## 2021-04-14 NOTE — Progress Notes (Signed)
I connected with [name] on 04/14/21 at 11:20 AM EDT by video and verified that I am speaking with the correct person using two identifiers.   I discussed the limitations, risks, security and privacy concerns of performing an evaluation and management service by video and the availability of in person appointments. I also discussed with the patient that there may be a patient responsible charge related to this service. The patient expressed understanding and agreed to proceed.  Patient location: Home Provider Location: [place] Participants: Lesleigh Noe and [name]   Subjective:     Kelly [name] is a 65 y.o. female presenting for Covid Positive (Symptom onset: 04/12/21), Nasal Congestion, Chills, and Cough (minor)     Cough Associated symptoms include headaches and myalgias. Pertinent negatives include no chest pain, chills, fever, sore throat or shortness of breath.   #covid positive - 04/12/2021 - treatment: tylenol for headache - last night was bad, feeling better today - sweats and chills - no loss of taste or smell   Hx of pancreatitis from taking steroids    Review of Systems  Constitutional:  Negative for chills and fever.  HENT:  Positive for congestion and sinus pressure. Negative for sore throat.   Respiratory:  Positive for cough. Negative for shortness of breath.   Cardiovascular:  Negative for chest pain.  Gastrointestinal:  Negative for diarrhea, nausea and vomiting.  Musculoskeletal:  Positive for myalgias. Negative for arthralgias.  Neurological:  Positive for headaches.    Social History   Tobacco Use  Smoking Status Never  Smokeless Tobacco Never        Objective:   BP Readings from Last 3 Encounters:  04/14/21 126/72  06/09/20 100/70  05/04/20 114/70   Wt Readings from Last 3 Encounters:  04/14/21 140 lb (63.5 kg)  07/14/20 144 lb (65.3 kg)  06/09/20 144 lb 6 oz (65.5 kg)   BP 126/72    Pulse 86   Temp 97.9 F (36.6 C) (Oral)   Wt 140 lb (63.5 kg)   BMI 24.80 kg/m   Physical Exam Constitutional:      Appearance: Normal appearance. She is not ill-appearing.  HENT:     Head: Normocephalic and atraumatic.     Right Ear: External ear normal.     Left Ear: External ear normal.  Eyes:     Conjunctiva/sclera: Conjunctivae normal.  Pulmonary:     Effort: Pulmonary effort is normal. No respiratory distress.  Neurological:     Mental Status: She is alert. Mental status is at baseline.  Psychiatric:        Mood and Affect: Mood normal.        Behavior: Behavior normal.        Thought Content: Thought content normal.        Judgment: Judgment normal.            Assessment & Plan:   Problem List Items Addressed This Visit   None Visit Diagnoses     COVID-19 virus infection    -  Primary   Relevant Medications   acetaminophen (TYLENOL) 650 MG CR tablet   nirmatrelvir/ritonavir EUA (PAXLOVID) TABS      Patient is at increased risk for developing severe covid due to age 49. They are eligible for anti-viral medication.   Discussed Paxlovid and they would like to start. Normal liver and kidney function.   Lab Results  Component Value Date   ALT 21 05/04/2020  AST 21 05/04/2020   ALKPHOS 67 05/04/2020   BILITOT 1.1 05/04/2020    Lab Results  Component Value Date   CREATININE 0.69 05/04/2020     Medication sent to pharmacy  Reviewed ER and return precautions  Reviewed isolation guidelines.    Return if symptoms worsen or fail to improve.  Lesleigh Noe, MD

## 2021-05-11 ENCOUNTER — Other Ambulatory Visit: Payer: Self-pay

## 2021-05-11 ENCOUNTER — Ambulatory Visit (INDEPENDENT_AMBULATORY_CARE_PROVIDER_SITE_OTHER): Payer: Medicare Other | Admitting: Podiatry

## 2021-05-11 ENCOUNTER — Ambulatory Visit (INDEPENDENT_AMBULATORY_CARE_PROVIDER_SITE_OTHER): Payer: Medicare Other

## 2021-05-11 ENCOUNTER — Encounter: Payer: Self-pay | Admitting: Podiatry

## 2021-05-11 DIAGNOSIS — M7751 Other enthesopathy of right foot: Secondary | ICD-10-CM | POA: Diagnosis not present

## 2021-05-11 DIAGNOSIS — M778 Other enthesopathies, not elsewhere classified: Secondary | ICD-10-CM | POA: Diagnosis not present

## 2021-05-11 DIAGNOSIS — L603 Nail dystrophy: Secondary | ICD-10-CM | POA: Diagnosis not present

## 2021-05-11 NOTE — Progress Notes (Signed)
Subjective:  Patient ID: YVANA SAMONTE, female    DOB: [birth year],  MRN: [id number] HPI Chief Complaint  Patient presents with   Foot Pain    5th met base right - aching x 1 month, noticeable with shoes   Nail Problem    Hallux right - discolored nail since Jan. 2020, not sore   New Patient (Initial Visit)    Est pt 49    65 y.o. female presents with the above complaint.   ROS: Denies fever chills nausea vomiting muscle aches pains calf pain back pain chest pain shortness of breath.  Past Medical History:  Diagnosis Date   Claustrophobia    Pancreatitis 2009, 2005   Shingles    Shingles outbreak 2006   Past Surgical History:  Procedure Laterality Date   BREAST BIOPSY     Nml per pt , f/u mammo 10/2011   CHOLECYSTECTOMY     COLONOSCOPY WITH PROPOFOL N/A 08/30/2017   Procedure: COLONOSCOPY WITH PROPOFOL;  Surgeon: Lollie Sails, MD;  Location: [facility] ENDOSCOPY;  Service: Endoscopy;  Laterality: N/A;   EXPLORATORY LAPAROTOMY     VAGINAL DELIVERY  1994    Current Outpatient Medications:    acetaminophen (TYLENOL) 650 MG CR tablet, Take 650 mg by mouth as needed., Disp: , Rfl:    cholecalciferol (VITAMIN D) 1000 UNITS tablet, Take 1,000 Units by mouth daily., Disp: , Rfl:   Allergies  Allergen Reactions   Other Other (See Comments)    Hormone replacement-----pancreatitis   Pantoprazole Sodium Diarrhea   Pollen Extract    Prednisolone Other (See Comments)    pancreatitis   Review of Systems Objective:  There were no vitals filed for this visit.  General: Well developed, nourished, in no acute distress, alert and oriented x3   Dermatological: Skin is warm, dry and supple bilateral. Nails x 10 are well maintained; remaining integument appears unremarkable at this time. There are no open sores, no preulcerative lesions, no rash or signs of infection present.  Nail dystrophy hallux left   Vascular: Dorsalis Pedis artery and Posterior Tibial artery pedal pulses  are 2/4 bilateral with immedate capillary fill time. Pedal hair growth present. No varicosities and no lower extremity edema present bilateral.   Neruologic: Grossly intact via light touch bilateral. Vibratory intact via tuning fork bilateral. Protective threshold with Semmes Wienstein monofilament intact to all pedal sites bilateral. Patellar and Achilles deep tendon reflexes 2+ bilateral. No Babinski or clonus noted bilateral.   Musculoskeletal: No gross boney pedal deformities bilateral. No pain, crepitus, or limitation noted with foot and ankle range of motion bilateral. Muscular strength 5/5 in all groups tested bilateral.  Limited range of motion of the first metatarsophalangeal joint of the right foot.  Mild tenderness on palpation of this area particular dorsal lateral aspect.  She also has tenderness on palpation of the posterior inferior aspect of the fifth met base.  Gait: Unassisted, Nonantalgic.    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  No fractures to the fifth met base.  No acute findings noted.  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  Nail dystrophy hallux left  Plan: Samples of skin and nail were taken today for pathologic evaluation.  Also recommended injecting the  fifth met base with 2 mg of dexamethasone which she declined at this point.  So we discussed possible over-the-counter options.  Also discussed appropriate shoe gear with her.  Discussed the possible need for a Keller arthroplasty.  Follow-up with her in 1 month for pathology results     Ikenna Ohms T. [place], [place]

## 2021-05-18 ENCOUNTER — Encounter: Payer: Self-pay | Admitting: Podiatry

## 2021-06-13 ENCOUNTER — Encounter: Payer: Self-pay | Admitting: Podiatry

## 2021-06-13 ENCOUNTER — Ambulatory Visit (INDEPENDENT_AMBULATORY_CARE_PROVIDER_SITE_OTHER): Payer: Medicare Other | Admitting: Podiatry

## 2021-06-13 ENCOUNTER — Other Ambulatory Visit: Payer: Self-pay

## 2021-06-13 DIAGNOSIS — L603 Nail dystrophy: Secondary | ICD-10-CM

## 2021-06-13 NOTE — Progress Notes (Signed)
She presents today for follow-up of her bursitis right foot.  She states that is doing much better.  She is also here for her pathology of her nails.  Objective: No change in physical exam pathology does demonstrate Trichophyton rubrum and some mild nail dystrophy.  Assessment onychomycosis and nail dystrophy.  Partial resolution of bursitis of fifth met left.  Plan: I discussed in great detail today oral therapy laser therapy and topical therapies with her.  At this point she understands and would like to hold off on making any decision.  I think she is leaning more toward the laser therapy because of its efficacy and the fact there is no medication involved.

## 2021-07-15 ENCOUNTER — Ambulatory Visit (INDEPENDENT_AMBULATORY_CARE_PROVIDER_SITE_OTHER): Payer: Medicare Other

## 2021-07-15 VITALS — Ht 63.0 in | Wt 136.0 lb

## 2021-07-15 DIAGNOSIS — Z0000 Encounter for general adult medical examination without abnormal findings: Secondary | ICD-10-CM

## 2021-07-15 NOTE — Progress Notes (Signed)
Subjective:   Kelly [name] is a 65 y.o. female who presents for Medicare Annual (Subsequent) preventive examination.  Review of Systems    No ROS.  Medicare Wellness Virtual Visit.  Visual/audio telehealth visit, UTA vital signs.   See social history for additional risk factors.   Cardiac Risk Factors include: advanced age (>28men, >99 women)     Objective:    Today's Vitals   07/15/21 0947  Weight: 136 lb (61.7 kg)  Height: 5\' 3"  (1.6 m)   Body mass index is 24.09 kg/m.  Advanced Directives 07/15/2021 07/14/2020 08/30/2017  Does Patient Have a Medical Advance Directive? Yes No Yes  Type of Paramedic of [place];Living will - Living will  Does patient want to make changes to medical advance directive? No - Patient declined - -  Copy of [place] in Chart? No - copy requested - -    Current Medications (verified) Outpatient Encounter Medications as of 07/15/2021  Medication Sig   acetaminophen (TYLENOL) 650 MG CR tablet Take 650 mg by mouth as needed.   cholecalciferol (VITAMIN D) 1000 UNITS tablet Take 1,000 Units by mouth daily.   No facility-administered encounter medications on file as of 07/15/2021.    Allergies (verified) Other, Pantoprazole sodium, Pollen extract, and Prednisolone   History: Past Medical History:  Diagnosis Date   Claustrophobia    Pancreatitis 2009, 2005   Shingles    Shingles outbreak 2006   Past Surgical History:  Procedure Laterality Date   BREAST BIOPSY     Nml per pt , f/u mammo 10/2011   CHOLECYSTECTOMY     COLONOSCOPY WITH PROPOFOL N/A 08/30/2017   Procedure: COLONOSCOPY WITH PROPOFOL;  Surgeon: Lollie Sails, MD;  Location: [facility] ENDOSCOPY;  Service: Endoscopy;  Laterality: N/A;   EXPLORATORY LAPAROTOMY     VAGINAL DELIVERY  1994   Family History  Problem Relation Age of Onset   Colon cancer Mother 41   Lymphoma Mother    Cancer Mother 66       lymphoma and colon    Heart disease Father 37   Multiple sclerosis Sister    Depression Sister    Diabetes Sister    Breast cancer Maternal Grandmother    Coronary artery disease Other    Heart attack Other        82's    Sudden Cardiac Death Other    Social History   Socioeconomic History   Marital status: Married    Spouse name: Not on file   Number of children: 1    Years of education: Not on file   Highest education level: Not on file  Occupational History   Occupation: Nurse Midwife - [employer]   Tobacco Use   Smoking status: Never   Smokeless tobacco: Never  Vaping Use   Vaping Use: Never used  Substance and Sexual Activity   Alcohol use: Yes    Comment: Occasional - 1 - 2 glasses wine a month   Drug use: No   Sexual activity: Not on file  Other Topics Concern   Not on file  Social History Narrative   Social research officer, government until [employer]   Social Determinants of Health   Financial Resource Strain: Low Risk    Difficulty of Paying Living Expenses: Not hard at all  Food Insecurity: No Food Insecurity   Worried About Charity fundraiser in the Last Year: Never true   [place] in the Last Year:  Never true  Transportation Needs: No Transportation Needs   Lack of Transportation (Medical): No   Lack of Transportation (Non-Medical): No  Physical Activity: Sufficiently Active   Days of Exercise per Week: 5 days   Minutes of Exercise per Session: 60 min  Stress: No Stress Concern Present   Feeling of Stress : Not at all  Social Connections: Socially Integrated   Frequency of Communication with Friends and Family: More than three times a week   Frequency of Social Gatherings with Friends and Family: More than three times a week   Attends Religious Services: More than 4 times per year   Active Member of [employer] or Organizations: Yes   Attends Music therapist: More than 4 times per year   Marital Status: Married    Tobacco Counseling Counseling given: Not Answered   Clinical  Intake:  Pre-visit preparation completed: Yes        Diabetes: No  How often do you need to have someone help you when you read instructions, pamphlets, or other written materials from your doctor or pharmacy?: 1 - Never    Interpreter Needed?: No      Activities of Daily Living In your present state of health, do you have any difficulty performing the following activities: 07/15/2021  Hearing? N  Vision? N  Difficulty concentrating or making decisions? N  Walking or climbing stairs? N  Dressing or bathing? N  Doing errands, shopping? N  Preparing Food and eating ? N  Using the Toilet? N  In the past six months, have you accidently leaked urine? N  Do you have problems with loss of bowel control? N  Managing your Medications? N  Managing your Finances? N  Housekeeping or managing your Housekeeping? N  Some recent data might be hidden    Patient Care Team: Burnard Hawthorne, FNP as PCP - General (Family Medicine) End, Harrell Gave, MD as Consulting Physician (Cardiology)  Indicate any recent Medical Services you may have received from other than [facility] providers in the past year (date may be approximate).     Assessment:   This is a routine wellness examination for Kelly [name].  Virtual Visit via Telephone Note  I connected with  Kelly [name] on 07/15/21 at  9:45 AM EDT by telephone and verified that I am speaking with the correct person using two identifiers.  Location: Patient: home Provider: office Persons participating in the virtual visit: patient/Nurse Health Advisor   I discussed the limitations, risks, security and privacy concerns of performing an evaluation and management service by telephone and the availability of in person appointments. The patient expressed understanding and agreed to proceed.  Interactive audio and video telecommunications were attempted between this nurse and patient, however failed, due to patient having technical  difficulties OR patient did not have access to video capability.  We continued and completed visit with audio only.  Some vital signs may be absent or patient reported.   [name], Kelly Anastasi L, LPN   Hearing/Vision screen Hearing Screening - Comments:: Patient is able to hear conversational tones. Notes tinnitus. Plans to follow up with Audiology.   Vision Screening - Comments:: Wears corrective lenses  They have seen their ophthalmologist in the last 12 months.   Dietary issues and exercise activities discussed: Current Exercise Habits: Home exercise routine;Structured exercise class, Type of exercise: walking, Time (Minutes): 60, Frequency (Times/Week): 5, Weekly Exercise (Minutes/Week): 300, Intensity: Moderate  Healthy diet Good water intake   Goals Addressed  This Visit's Progress    Maintain Healthy Lifestyle       Stay active Healthy diet       Depression Screen PHQ 2/9 Scores 07/15/2021 07/14/2020 07/03/2019 02/18/2018 01/31/2017 12/07/2015  PHQ - 2 Score 0 0 0 0 0 0  PHQ- 9 Score - - 0 1 - -    Fall Risk Fall Risk  07/15/2021 07/14/2020 05/04/2020 07/03/2019 01/31/2017  Falls in the past year? 0 0 0 0 No  Number falls in past yr: 0 0 - 0 -  Injury with Fall? - - - 0 -  Follow up - Falls evaluation completed Falls evaluation completed Falls evaluation completed -    FALL RISK PREVENTION PERTAINING TO THE HOME: Adequate lighting in your home to reduce risk of falls? Yes   ASSISTIVE DEVICES UTILIZED TO PREVENT FALLS: Life alert? No  Use of a cane, walker or w/c? No   TIMED UP AND GO: Was the test performed? No .   Cognitive Function:  Patient is alert and oriented x3.  Enjoys 2 book clubs.  MMSE.6CIT deferred. Normal by direct communication/observation.    6CIT Screen 07/14/2020  What Year? 0 points  What month? 0 points  What time? 0 points  Count back from 20 0 points  Months in reverse 0 points    Immunizations Immunization History   Administered Date(s) Administered   DTaP 01/31/2019   Fluad Quad(high Dose 65+) 06/27/2021   Hepatitis A, Adult 06/26/1995, 08/19/1995   Influenza Split 07/18/2011   Influenza-Unspecified 08/06/2013, 06/25/2015   Meningococcal Polysaccharide 11/09/1995   OPV 10/19/1974   PFIZER(Purple Top)SARS-COV-2 Vaccination 09/15/2019, 10/09/2019, 06/21/2020, 07/13/2021   Pneumococcal Conjugate-13 04/07/2019   Pneumococcal Polysaccharide-23 05/04/2020   Tdap 09/18/2009, 01/31/2019   Yellow Fever 02/26/1995   Qualifies for Shingles Vaccine? Yes   Zostavax completed No   Shingrix Completed?: No.    Education has been provided regarding the importance of this vaccine. Patient has been advised to call insurance company to determine out of pocket expense if they have not yet received this vaccine. Advised may also receive vaccine at local pharmacy or Health Dept. Verbalized acceptance and understanding.  Screening Tests Health Maintenance  Topic Date Due   Zoster Vaccines- Shingrix (1 of 2) 10/15/2021 (Originally 09/24/2003)   COVID-19 Vaccine (5 - Booster for Pfizer series) 09/07/2021   MAMMOGRAM  08/03/2022   COLONOSCOPY (Pts 45-61yrs Insurance coverage will need to be confirmed)  08/30/2022   TETANUS/TDAP  01/30/2029   Pneumonia Vaccine 70+ Years old  Completed   INFLUENZA VACCINE  Completed   DEXA SCAN  Completed   Hepatitis C Screening  Completed   HPV VACCINES  Aged Out    Health Maintenance  There are no preventive care reminders to display for this patient.  Mammogram- due 07/2021. Patient plans to schedule with [facility].   Lung Cancer Screening: (Low Dose CT Chest recommended if Age 60-80 years, 30 pack-year currently smoking OR have quit w/in 15years.) does not qualify.   Vision Screening: Recommended annual ophthalmology exams for early detection of glaucoma and other disorders of the eye. Is the patient up to date with their annual eye exam?  Yes  Who is the provider or what is the  name of the office in which the patient attends annual eye exams? [place]  Dental Screening: Recommended annual dental exams for proper oral hygiene. Visits every 6 months.   Community Resource Referral / Chronic Care Management: CRR required this visit?  No  CCM required this visit?  No      Plan:     I have personally reviewed and noted the following in the patient's chart:   Medical and social history Use of alcohol, tobacco or illicit drugs  Current medications and supplements including opioid prescriptions. Not taking opioid.  Functional ability and status Nutritional status Physical activity Advanced directives List of other physicians Hospitalizations, surgeries, and ER visits in previous 12 months Vitals Screenings to include cognitive, depression, and falls Referrals and appointments  In addition, I have reviewed and discussed with patient certain preventive protocols, quality metrics, and best practice recommendations. A written personalized care plan for preventive services as well as general preventive health recommendations were provided to patient.     Varney Biles, LPN   [id number]

## 2021-07-15 NOTE — Patient Instructions (Addendum)
Kelly [name] , Thank you for taking time to come for your Medicare Wellness Visit. I appreciate your ongoing commitment to your health goals. Please review the following plan we discussed and let me know if I can assist you in the future.   These are the goals we discussed:  Goals      Maintain Healthy Lifestyle     Stay active Healthy diet        This is a list of the screening recommended for you and due dates:  Health Maintenance  Topic Date Due   Zoster (Shingles) Vaccine (1 of 2) 10/15/2021*   COVID-19 Vaccine (5 - Booster for Pfizer series) 09/07/2021   Mammogram  08/03/2022   Colon Cancer Screening  08/30/2022   Tetanus Vaccine  01/30/2029   Pneumonia Vaccine  Completed   Flu Shot  Completed   DEXA scan (bone density measurement)  Completed   Hepatitis C Screening: USPSTF Recommendation to screen - Ages 20-79 yo.  Completed   HPV Vaccine  Aged Out  *Topic was postponed. The date shown is not the original due date.   Advanced directives: End of life planning; Advance aging; Advanced directives discussed.  Copy of current HCPOA/Living Will requested.    Conditions/risks identified: none new  Next appointment: Follow up in one year for your annual wellness visit    Preventive Care 65 Years and Older, Female Preventive care refers to lifestyle choices and visits with your health care provider that can promote health and wellness. What does preventive care include? A yearly physical exam. This is also called an annual well check. Dental exams once or twice a year. Routine eye exams. Ask your health care provider how often you should have your eyes checked. Personal lifestyle choices, including: Daily care of your teeth and gums. Regular physical activity. Eating a healthy diet. Avoiding tobacco and drug use. Limiting alcohol use. Practicing safe sex. Taking low-dose aspirin every day. Taking vitamin and mineral supplements as recommended by your health care  provider. What happens during an annual well check? The services and screenings done by your health care provider during your annual well check will depend on your age, overall health, lifestyle risk factors, and family history of disease. Counseling  Your health care provider may ask you questions about your: Alcohol use. Tobacco use. Drug use. Emotional well-being. Home and relationship well-being. Sexual activity. Eating habits. History of falls. Memory and ability to understand (cognition). Work and work Statistician. Reproductive health. Screening  You may have the following tests or measurements: Height, weight, and BMI. Blood pressure. Lipid and cholesterol levels. These may be checked every 5 years, or more frequently if you are over 58 years old. Skin check. Lung cancer screening. You may have this screening every year starting at age 38 if you have a 30-pack-year history of smoking and currently smoke or have quit within the past 15 years. Fecal occult blood test (FOBT) of the stool. You may have this test every year starting at age 14. Flexible sigmoidoscopy or colonoscopy. You may have a sigmoidoscopy every 5 years or a colonoscopy every 10 years starting at age 80. Hepatitis C blood test. Hepatitis B blood test. Sexually transmitted disease (STD) testing. Diabetes screening. This is done by checking your blood sugar (glucose) after you have not eaten for a while (fasting). You may have this done every 1-3 years. Bone density scan. This is done to screen for osteoporosis. You may have this done starting at age 7. Mammogram.  This may be done every 1-2 years. Talk to your health care provider about how often you should have regular mammograms. Talk with your health care provider about your test results, treatment options, and if necessary, the need for more tests. Vaccines  Your health care provider may recommend certain vaccines, such as: Influenza vaccine. This is  recommended every year. Tetanus, diphtheria, and acellular pertussis (Tdap, Td) vaccine. You may need a Td booster every 10 years. Zoster vaccine. You may need this after age 60. Pneumococcal 13-valent conjugate (PCV13) vaccine. One dose is recommended after age 86. Pneumococcal polysaccharide (PPSV23) vaccine. One dose is recommended after age 80. Talk to your health care provider about which screenings and vaccines you need and how often you need them. This information is not intended to replace advice given to you by your health care provider. Make sure you discuss any questions you have with your health care provider. Document Released: 10/01/2015 Document Revised: 05/24/2016 Document Reviewed: 07/06/2015 Elsevier Interactive Patient Education  2017 [place] Prevention in the Home Falls can cause injuries. They can happen to people of all ages. There are many things you can do to make your home safe and to help prevent falls. What can I do on the outside of my home? Regularly fix the edges of walkways and driveways and fix any cracks. Remove anything that might make you trip as you walk through a door, such as a raised step or threshold. Trim any bushes or trees on the path to your home. Use bright outdoor lighting. Clear any walking paths of anything that might make someone trip, such as rocks or tools. Regularly check to see if handrails are loose or broken. Make sure that both sides of any steps have handrails. Any raised decks and porches should have guardrails on the edges. Have any leaves, snow, or ice cleared regularly. Use sand or salt on walking paths during winter. Clean up any spills in your garage right away. This includes oil or grease spills. What can I do in the bathroom? Use night lights. Install grab bars by the toilet and in the tub and shower. Do not use towel bars as grab bars. Use non-skid mats or decals in the tub or shower. If you need to sit down in  the shower, use a plastic, non-slip stool. Keep the floor dry. Clean up any water that spills on the floor as soon as it happens. Remove soap buildup in the tub or shower regularly. Attach bath mats securely with double-sided non-slip rug tape. Do not have throw rugs and other things on the floor that can make you trip. What can I do in the bedroom? Use night lights. Make sure that you have a light by your bed that is easy to reach. Do not use any sheets or blankets that are too big for your bed. They should not hang down onto the floor. Have a firm chair that has side arms. You can use this for support while you get dressed. Do not have throw rugs and other things on the floor that can make you trip. What can I do in the kitchen? Clean up any spills right away. Avoid walking on wet floors. Keep items that you use a lot in easy-to-reach places. If you need to reach something above you, use a strong step stool that has a grab bar. Keep electrical cords out of the way. Do not use floor polish or wax that makes floors slippery. If you  must use wax, use non-skid floor wax. Do not have throw rugs and other things on the floor that can make you trip. What can I do with my stairs? Do not leave any items on the stairs. Make sure that there are handrails on both sides of the stairs and use them. Fix handrails that are broken or loose. Make sure that handrails are as long as the stairways. Check any carpeting to make sure that it is firmly attached to the stairs. Fix any carpet that is loose or worn. Avoid having throw rugs at the top or bottom of the stairs. If you do have throw rugs, attach them to the floor with carpet tape. Make sure that you have a light switch at the top of the stairs and the bottom of the stairs. If you do not have them, ask someone to add them for you. What else can I do to help prevent falls? Wear shoes that: Do not have high heels. Have rubber bottoms. Are comfortable  and fit you well. Are closed at the toe. Do not wear sandals. If you use a stepladder: Make sure that it is fully opened. Do not climb a closed stepladder. Make sure that both sides of the stepladder are locked into place. Ask someone to hold it for you, if possible. Clearly mark and make sure that you can see: Any grab bars or handrails. First and last steps. Where the edge of each step is. Use tools that help you move around (mobility aids) if they are needed. These include: Canes. Walkers. Scooters. Crutches. Turn on the lights when you go into a dark area. Replace any light bulbs as soon as they burn out. Set up your furniture so you have a clear path. Avoid moving your furniture around. If any of your floors are uneven, fix them. If there are any pets around you, be aware of where they are. Review your medicines with your doctor. Some medicines can make you feel dizzy. This can increase your chance of falling. Ask your doctor what other things that you can do to help prevent falls. This information is not intended to replace advice given to you by your health care provider. Make sure you discuss any questions you have with your health care provider. Document Released: 07/01/2009 Document Revised: 02/10/2016 Document Reviewed: 10/09/2014 Elsevier Interactive Patient Education  2017 [employer].

## 2021-08-19 ENCOUNTER — Ambulatory Visit (INDEPENDENT_AMBULATORY_CARE_PROVIDER_SITE_OTHER): Payer: Medicare Other | Admitting: Internal Medicine

## 2021-08-19 ENCOUNTER — Encounter: Payer: Self-pay | Admitting: Internal Medicine

## 2021-08-19 ENCOUNTER — Other Ambulatory Visit: Payer: Self-pay

## 2021-08-19 VITALS — BP 106/74 | HR 66 | Ht 63.0 in | Wt 136.0 lb

## 2021-08-19 DIAGNOSIS — I493 Ventricular premature depolarization: Secondary | ICD-10-CM

## 2021-08-19 DIAGNOSIS — R079 Chest pain, unspecified: Secondary | ICD-10-CM

## 2021-08-19 DIAGNOSIS — E7800 Pure hypercholesterolemia, unspecified: Secondary | ICD-10-CM | POA: Diagnosis not present

## 2021-08-19 NOTE — Progress Notes (Signed)
Follow-up Outpatient Visit Date: 08/19/2021  Primary Care Provider: Burnard Hawthorne, FNP [address] [place] [address]  Chief Complaint: Follow-up PVCs  HPI:  [name] is a 65 y.o. female with history of chronic small pericardial effusion, frequent PVCs, pancreatitis, shingles, and claustrophobia, who presents for follow-up of PVCs.  I last saw her in 05/2020, at which time she was feeling well with less palpitations/PVCs after retiring.  We did not make any medication changes or pursue additional testing.  Today, [name] reports that she has been feeling fairly well.  She recounts one episode of chest tightness that occurred after she did some upper arm exercises.  She describes tightness around the lower part of her rib cage that seem to be positional.  It resolved after 1 day and has not returned.  She has not had any exertional chest pain or shortness of breath, palpitations, lightheadedness, or edema.  She happily reports that she is lost about 10 pounds due to increased walking and exercise.  --------------------------------------------------------------------------------------------------  Cardiovascular History & Procedures: Cardiovascular Problems: Palpitations/PVC's Pericardial effusion   Risk Factors: Age greater than 51   Cath/PCI: None   CV Surgery: None   EP Procedures and Devices: 3-day event monitor (08/27/2019): Predominantly sinus rhythm with rare PACs and frequent PVCs (PVC burden 14.5%).  Single episode of brief PSVT also noted. 24-hour Holter monitor (11/13/2016): Rare PACs and PVCs.  Single 3 beat atrial run.  No sustained arrhythmia or prolonged pause.   Non-Invasive Evaluation(s): Pharmacologic MPI (10/31/2019): Low risk study without ischemia or scar.  No coronary artery calcification.  LVEF 69%.  PVCs noted during study. TTE (08/21/2019): Normal LV size and wall thickness.  LVEF 60-65% with grade 2 diastolic dysfunction.   Normal RV size and function.  Mild left atrial enlargement.  Mild to moderate tricuspid regurgitation.  Upper normal PA pressure.  Mildly elevated CVP.  Small pericardial effusion. Limited TTE (04/03/2017): Upper normal LV size with normal wall thickness.  LVEF 60-65% with normal wall motion.  Small pericardial effusion, stable to minimally increased in size from 10/2016. TTE (11/15/2016): Normal LV size with upper normal wall thickness.  LVEF 55-60% with normal wall motion and grade 1 diastolic dysfunction.  Mild aortic and tricuspid regurgitation.  Normal RV size and function dilated IVC.  Trivial pericardial effusion.  Recent CV Pertinent Labs: Lab Results  Component Value Date   CHOL 269 (H) 05/04/2020   HDL 90.00 05/04/2020   LDLCALC 165 (H) 05/04/2020   LDLDIRECT 151.3 10/19/2011   TRIG 71.0 05/04/2020   CHOLHDL 3 05/04/2020   K 4.3 05/04/2020   K 3.7 09/20/2014   MG 2.1 07/17/2019   BUN 17 05/04/2020   BUN 15 07/17/2019   BUN 15 09/20/2014   CREATININE 0.69 05/04/2020   CREATININE 0.64 09/20/2014    Past medical and surgical history were reviewed and updated in EPIC.  Current Meds  Medication Sig   acetaminophen (TYLENOL) 650 MG CR tablet Take 650 mg by mouth as needed.   cholecalciferol (VITAMIN D) 1000 UNITS tablet Take 1,000 Units by mouth daily.    Allergies: Estradiol, Pantoprazole sodium, Pollen extract, and Prednisolone  Social History   Tobacco Use   Smoking status: Never   Smokeless tobacco: Never  Vaping Use   Vaping Use: Never used  Substance Use Topics   Alcohol use: Yes    Comment: 6 glasses of wine a month   Drug use: No    Family History  Problem Relation Age of Onset   Colon cancer Mother 57   Lymphoma Mother    Cancer Mother 44       lymphoma and colon   Heart disease Father 22   Multiple sclerosis Sister    Depression Sister    Diabetes Sister    Breast cancer Maternal Grandmother    Coronary artery disease Other    Heart attack Other         12-11-2022    Sudden Cardiac Death Other     Review of Systems: A 12-system review of systems was performed and was negative except as noted in the HPI.  --------------------------------------------------------------------------------------------------  Physical Exam: BP 106/74 (BP Location: Left Arm, Patient Position: Sitting, Cuff Size: Normal)   Pulse 66   Ht 5\' 3"  (1.6 m)   Wt 136 lb (61.7 kg)   SpO2 98%   BMI 24.09 kg/m   General:  NAD. Neck: No JVD or HJR. Lungs: Clear to auscultation bilaterally without wheezes or crackles. Heart: Regular rate and rhythm without murmurs, rubs, or gallops. Abdomen: Soft, nontender, nondistended. Extremities: No lower extremity edema.  EKG: Normal sinus rhythm with PACs.  PAC is new since 06/09/2020.  Otherwise, no significant interval change.  Lab Results  Component Value Date   WBC 5.4 05/04/2020   HGB 15.0 05/04/2020   HCT 44.5 05/04/2020   MCV 99.3 05/04/2020   PLT 178.0 05/04/2020    Lab Results  Component Value Date   NA 140 05/04/2020   K 4.3 05/04/2020   CL 102 05/04/2020   CO2 30 05/04/2020   BUN 17 05/04/2020   CREATININE 0.69 05/04/2020   GLUCOSE 93 05/04/2020   ALT 21 05/04/2020    Lab Results  Component Value Date   CHOL 269 (H) 05/04/2020   HDL 90.00 05/04/2020   LDLCALC 165 (H) 05/04/2020   LDLDIRECT 151.3 10/19/2011   TRIG 71.0 05/04/2020   CHOLHDL 3 05/04/2020    --------------------------------------------------------------------------------------------------  ASSESSMENT AND PLAN: PVCs: [name] has not had any significant palpitations since our last visit over a year ago.  EKG today shows a single PAC but no PVCs.  I will check basic labs today including CBC and CMP.  No plans for further work-up or intervention at this time given reassuring echo and Myoview in the past and absence of ongoing symptoms.  Chest pain: [name] recounts a single episode of atypical chest pain after doing  arm exercises that sounds musculoskeletal in nature.  Myocardial perfusion stress test last year was low risk without ischemia, scar, or coronary artery calcification.  We have agreed to defer further work-up unless chest pain recurs.  Hyperlipidemia: Last lipid panel in 04/2020 was notable for moderately elevated LDL (165).  We will check a CMP and lipid panel today.  If LDL has increased further in spite of weight loss and increased activity, statin therapy will need to be considered (especially if LDL is above 190).  Follow-up: Return to clinic in 1 year.  Nelva Bush, MD 08/19/2021 9:13 AM

## 2021-08-19 NOTE — Patient Instructions (Signed)
Medication Instructions:   Your physician recommends that you continue on your current medications as directed. Please refer to the Current Medication list given to you today.  *If you need a refill on your cardiac medications before your next appointment, please call your pharmacy*   Lab Work:  Today: CBC, CMET, Lipid panel  If you have labs (blood work) drawn today and your tests are completely normal, you will receive your results only by: [place] (if you have MyChart) OR A paper copy in the mail If you have any lab test that is abnormal or we need to change your treatment, we will call you to review the results.   Testing/Procedures:  None ordered   Follow-Up: At [facility], you and your health needs are our priority.  As part of our continuing mission to provide you with exceptional heart care, we have created designated Provider Care Teams.  These Care Teams include your primary Cardiologist (physician) and Advanced Practice Providers (APPs -  Physician Assistants and Nurse Practitioners) who all work together to provide you with the care you need, when you need it.  We recommend signing up for the patient portal called "MyChart".  Sign up information is provided on this After Visit Summary.  MyChart is used to connect with patients for Virtual Visits (Telemedicine).  Patients are able to view lab/test results, encounter notes, upcoming appointments, etc.  Non-urgent messages can be sent to your provider as well.   To learn more about what you can do with MyChart, go to [email or web address].    Your next appointment:   1 year(s)  The format for your next appointment:   In Person  Provider:   You may see Dr. Harrell Gave End or one of the following Advanced Practice Providers on your designated Care Team:   Murray Hodgkins, NP Christell Faith, PA-C Cadence Kathlen Mody, [place]

## 2021-08-20 LAB — CBC
Hematocrit: 39.7 % (ref 34.0–46.6)
Hemoglobin: 13.3 g/dL (ref 11.1–15.9)
MCH: 33.1 pg — ABNORMAL HIGH (ref 26.6–33.0)
MCHC: 33.5 g/dL (ref 31.5–35.7)
MCV: 99 fL — ABNORMAL HIGH (ref 79–97)
Platelets: 211 10*3/uL (ref 150–450)
RBC: 4.02 x10E6/uL (ref 3.77–5.28)
RDW: 12.4 % (ref 11.7–15.4)
WBC: 5.7 10*3/uL (ref 3.4–10.8)

## 2021-08-20 LAB — LIPID PANEL
Chol/HDL Ratio: 3.2 ratio (ref 0.0–4.4)
Cholesterol, Total: 272 mg/dL — ABNORMAL HIGH (ref 100–199)
HDL: 86 mg/dL (ref >39)
LDL Chol Calc (NIH): 171 mg/dL — ABNORMAL HIGH (ref 0–99)
Triglycerides: 91 mg/dL (ref 0–149)
VLDL Cholesterol Cal: 15 mg/dL (ref 5–40)

## 2021-08-20 LAB — COMPREHENSIVE METABOLIC PANEL
ALT: 17 IU/L (ref 0–32)
AST: 21 IU/L (ref 0–40)
Albumin/Globulin Ratio: 1.9 (ref 1.2–2.2)
Albumin: 4.5 g/dL (ref 3.8–4.8)
Alkaline Phosphatase: 79 IU/L (ref 44–121)
BUN/Creatinine Ratio: 36 — ABNORMAL HIGH (ref 12–28)
BUN: 21 mg/dL (ref 8–27)
Bilirubin Total: 0.5 mg/dL (ref 0.0–1.2)
CO2: 22 mmol/L (ref 20–29)
Calcium: 9.1 mg/dL (ref 8.7–10.3)
Chloride: 105 mmol/L (ref 96–106)
Creatinine, Ser: 0.59 mg/dL (ref 0.57–1.00)
Globulin, Total: 2.4 g/dL (ref 1.5–4.5)
Glucose: 95 mg/dL (ref 70–99)
Potassium: 4 mmol/L (ref 3.5–5.2)
Sodium: 140 mmol/L (ref 134–144)
Total Protein: 6.9 g/dL (ref 6.0–8.5)
eGFR: 99 mL/min/{1.73_m2} (ref >59)

## 2021-09-26 ENCOUNTER — Telehealth: Payer: Self-pay | Admitting: Family

## 2021-09-26 NOTE — Telephone Encounter (Signed)
LMTCB for mammogram results.  

## 2021-09-26 NOTE — Telephone Encounter (Signed)
Patient called in, stated she returning the call back to Judson Roch, please call patient @ [phone]

## 2021-09-26 NOTE — Telephone Encounter (Signed)
LMTCB. To prevent phone tag okay to let patient know that all was well with mammogram. Any other questions send message or call to see if I am available.

## 2021-09-26 NOTE — Telephone Encounter (Signed)
Call pt  Your mammogram is normal this year at [facility]. Please ensure annual mammogram next unless any concerns, symptoms prior.

## 2021-09-27 NOTE — Telephone Encounter (Signed)
Pt was relayed message and pt had no other questions

## 2022-01-06 NOTE — Progress Notes (Signed)
?Charlann Boxer D.O. ?[facility] ?[place] ?Phone: [phone] ?Subjective:   ?I, Kelly [name], am serving as a Education administrator for Dr. Hulan Saas. ?This visit occurred during the SARS-CoV-2 public health emergency.  Safety protocols were in place, including screening questions prior to the visit, additional usage of staff PPE, and extensive cleaning of exam room while observing appropriate contact time as indicated for disinfecting solutions.  ? ?I'm seeing this patient by the request  of:  Burnard Hawthorne, FNP ? ?CC: low back pain  ? ?[id number]  ?Kelly [name] is a 66 y.o. female coming in with complaint of LBP. Patient states LBP has been going on for a few months and fluctuates in getting better and worse. Mostly on the left side. No radicular symptoms. Has been using biofreeze and getting massages. Worse when laying down and and sitting for a while. Walking is fine. Thinks dog pulling her may have started it. Also right arm goes numb when sleeping. That also wakes her at night. ? ? ? ?  ? ?Past Medical History:  ?Diagnosis Date  ? Claustrophobia   ? Pancreatitis 2009, 2005  ? Shingles   ? Shingles outbreak 2006  ? ?Past Surgical History:  ?Procedure Laterality Date  ? BREAST BIOPSY    ? Nml per pt , f/u mammo 10/2011  ? CHOLECYSTECTOMY    ? COLONOSCOPY WITH PROPOFOL N/A 08/30/2017  ? Procedure: COLONOSCOPY WITH PROPOFOL;  Surgeon: Lollie Sails, MD;  Location: [facility] ENDOSCOPY;  Service: Endoscopy;  Laterality: N/A;  ? EXPLORATORY LAPAROTOMY    ? TRIGGER FINGER RELEASE    ? VAGINAL DELIVERY  09/18/1992  ? ?Social History  ? ?Socioeconomic History  ? Marital status: Married  ?  Spouse name: Not on file  ? Number of children: 1   ? Years of education: Not on file  ? Highest education level: Not on file  ?Occupational History  ? Occupation: Geologist, engineering - [employer]   ?Tobacco Use  ? Smoking status: Never  ? Smokeless tobacco: Never  ?Vaping Use  ? Vaping Use: Never  used  ?Substance and Sexual Activity  ? Alcohol use: Yes  ?  Comment: 6 glasses of wine a month  ? Drug use: No  ? Sexual activity: Not on file  ?Other Topics Concern  ? Not on file  ?Social History Narrative  ? Social research officer, government until 1997  ? ?Social Determinants of Health  ? ?Financial Resource Strain: Low Risk   ? Difficulty of Paying Living Expenses: Not hard at all  ?Food Insecurity: No Food Insecurity  ? Worried About Charity fundraiser in the Last Year: Never true  ? Ran Out of Food in the Last Year: Never true  ?Transportation Needs: No Transportation Needs  ? Lack of Transportation (Medical): No  ? Lack of Transportation (Non-Medical): No  ?Physical Activity: Sufficiently Active  ? Days of Exercise per Week: 5 days  ? Minutes of Exercise per Session: 60 min  ?Stress: No Stress Concern Present  ? Feeling of Stress : Not at all  ?Social Connections: Socially Integrated  ? Frequency of Communication with Friends and Family: More than three times a week  ? Frequency of Social Gatherings with Friends and Family: More than three times a week  ? Attends Religious Services: More than 4 times per year  ? Active Member of Clubs or Organizations: Yes  ? Attends Archivist Meetings: More than 4 times per year  ?  Marital Status: Married  ? ?Allergies  ?Allergen Reactions  ? Estradiol   ?  Other reaction(s): pancreatitis  ? Pantoprazole Sodium Diarrhea  ? Pollen Extract   ? Prednisolone Other (See Comments)  ?  pancreatitis  ? ?Family History  ?Problem Relation Age of Onset  ? Colon cancer Mother 91  ? Lymphoma Mother   ? Cancer Mother 14  ?     lymphoma and colon  ? Heart disease Father 74  ? Multiple sclerosis Sister   ? Depression Sister   ? Diabetes Sister   ? Breast cancer Maternal Grandmother   ? Coronary artery disease Other   ? Heart attack Other   ?     40's   ? Sudden Cardiac Death Other   ? ? ? ? ? ?Current Outpatient Medications (Analgesics):  ?  acetaminophen (TYLENOL) 650 MG CR tablet, Take 650 mg by  mouth as needed. ? ? ?Current Outpatient Medications (Other):  ?  gabapentin (NEURONTIN) 100 MG capsule, Take 1 capsule (100 mg total) by mouth at bedtime. ?  cholecalciferol (VITAMIN D) 1000 UNITS tablet, Take 1,000 Units by mouth daily. ? ? ?Reviewed prior external information including notes and imaging from  ?primary care provider ?As well as notes that were available from care everywhere and other healthcare systems. ? ?Past medical history, social, surgical and family history all reviewed in electronic medical record.  No pertanent information unless stated regarding to the chief complaint.  ? ?Review of Systems: ? No headache, visual changes, nausea, vomiting, diarrhea, constipation, dizziness, abdominal pain, skin rash, fevers, chills, night sweats, weight loss, swollen lymph nodes, body aches, joint swelling, chest pain, shortness of breath, mood changes. POSITIVE muscle aches ? ?Objective  ?Blood pressure 110/64, pulse 76, height '5\' 3"'$  (1.6 m), weight 136 lb (61.7 kg), SpO2 97 %. ?  ?General: No apparent distress alert and oriented x3 mood and affect normal, dressed appropriately.  ?HEENT: Pupils equal, extraocular movements intact  ?Respiratory: Patient's speak in full sentences and does not appear short of breath  ?Cardiovascular: No lower extremity edema, non tender, no erythema  ?Gait normal with good balance and coordination.  ?MSK: Low back exam does have some loss of lordosis.  Some tenderness to palpation in the paraspinal musculature worse pain over the left piriformis muscle and anywhere else.  Patient does have tightness with FABER test.  Negative straight leg test. ? ?Neck exam does have some mild loss of lordosis.  Some tightness noted with right-sided sidebending and left-sided rotation.  Lacks last 10 degrees of extension. ? ?85277; 15 additional minutes spent for Therapeutic exercises as stated in above notes.  This included exercises focusing on stretching, strengthening, with significant  focus on eccentric aspects.   Long term goals include an improvement in range of motion, strength, endurance as well as avoiding reinjury. Patient's frequency would include in 1-2 times a day, 3-5 times a week for a duration of 6-12 weeks. Using Netter's Orthopaedic Anatomy, reviewed with the patient the structures involved and how they related to diagnosis. The patient indicated understanding.  ? ?The patient was given a handout about classic piriformis stretching including [employer], Modified [employer], my self-described "Sink Stretch," and other piriformis rehab. ? ?We also reviewed hip flexor and abductor strengthening, ham stretching ? ?Rec deep massage, explained self-massage with ball ? Proper technique shown and discussed handout in great detail with ATC.  All questions were discussed and answered.  ? ? ?  ?Impression and Recommendations:  ?  ?  The above documentation has been reviewed and is accurate and complete Lyndal Pulley, DO ? ? ? ?

## 2022-01-09 ENCOUNTER — Ambulatory Visit (INDEPENDENT_AMBULATORY_CARE_PROVIDER_SITE_OTHER): Payer: Medicare Other

## 2022-01-09 ENCOUNTER — Ambulatory Visit: Payer: Medicare Other

## 2022-01-09 ENCOUNTER — Ambulatory Visit (INDEPENDENT_AMBULATORY_CARE_PROVIDER_SITE_OTHER): Payer: Medicare Other | Admitting: Family Medicine

## 2022-01-09 VITALS — BP 110/64 | HR 76 | Ht 63.0 in | Wt 136.0 lb

## 2022-01-09 DIAGNOSIS — M545 Low back pain, unspecified: Secondary | ICD-10-CM

## 2022-01-09 DIAGNOSIS — G5702 Lesion of sciatic nerve, left lower limb: Secondary | ICD-10-CM | POA: Diagnosis not present

## 2022-01-09 DIAGNOSIS — M5412 Radiculopathy, cervical region: Secondary | ICD-10-CM | POA: Diagnosis not present

## 2022-01-09 MED ORDER — GABAPENTIN 100 MG PO CAPS: 100.0000 mg | ORAL_CAPSULE | Freq: Every day | ORAL | 0 refills | Status: DC

## 2022-01-09 NOTE — Patient Instructions (Signed)
Gabapentin '100mg'$  at night ?Do prescribed exercises at least 3x a week ? ?Mattress: kings down, foster and stern, beauty rest black ?Coop pillow  ?Tennis ball back left pocket with sitting ?See you again in 6-7 weeks ?

## 2022-01-09 NOTE — Assessment & Plan Note (Signed)
Patient does have more of a piriformis syndrome.  Discussed icing regimen and home exercises, which activities to doing which wants to avoid, increase activity previously.  We will follow-up again in 6 to 12 weeks.  At that time if worsening pain can consider the possibility of home exercises, formal physical therapy and even osteopathic manipulation. ?Gabapentin prescribed today to help with some of the nighttime pain and will get lumbar x-ray. ?

## 2022-02-21 NOTE — Progress Notes (Unsigned)
Kelly [name] [place] [address] [place] Phone: [phone] Subjective:   [name], am serving as a scribe for Dr. Hulan Saas.  I'm seeing this patient by the request  of:  Burnard Hawthorne, FNP  CC: Follow-up for back pain, right wrist pain  [id number]  01/09/2022 Patient does have more of a piriformis syndrome.  Discussed icing regimen and home exercises, which activities to doing which wants to avoid, increase activity previously.  We will follow-up again in 6 to 12 weeks.  At that time if worsening pain can consider the possibility of home exercises, formal physical therapy and even osteopathic manipulation. Gabapentin prescribed today to help with some of the nighttime pain and will get lumbar x-ray.  Update 02/22/2022 Kelly [name] is a 66 y.o. female coming in with complaint of neck, low back and L piriformis pain. Patient states piriformis pain is better. Exercises with stretches and motrin have helped a lot. Having numbness in right fingers wakes her at night.  Seems to start at the wrist and goes distal.  Patient states that once gets moving seems to get better.  Can wake her up though at night.       Past Medical History:  Diagnosis Date   Claustrophobia    Pancreatitis 2009, 2005   Shingles    Shingles outbreak 2006   Past Surgical History:  Procedure Laterality Date   BREAST BIOPSY     Nml per pt , f/u mammo 10/2011   CHOLECYSTECTOMY     COLONOSCOPY WITH PROPOFOL N/A 08/30/2017   Procedure: COLONOSCOPY WITH PROPOFOL;  Surgeon: Lollie Sails, MD;  Location: [facility] ENDOSCOPY;  Service: Endoscopy;  Laterality: N/A;   EXPLORATORY LAPAROTOMY     TRIGGER FINGER RELEASE     VAGINAL DELIVERY  09/18/1992   Social History   Socioeconomic History   Marital status: Married    Spouse name: Not on file   Number of children: 1    Years of education: Not on file   Highest education level: Not on file   Occupational History   Occupation: Nurse Midwife - [employer]   Tobacco Use   Smoking status: Never   Smokeless tobacco: Never  Vaping Use   Vaping Use: Never used  Substance and Sexual Activity   Alcohol use: Yes    Comment: 6 glasses of wine a month   Drug use: No   Sexual activity: Not on file  Other Topics Concern   Not on file  Social History Narrative   Social research officer, government until 1997   Social Determinants of Health   Financial Resource Strain: Low Risk    Difficulty of Paying Living Expenses: Not hard at all  Food Insecurity: No Food Insecurity   Worried About Charity fundraiser in the Last Year: Never true   Arboriculturist in the Last Year: Never true  Transportation Needs: No Transportation Needs   Lack of Transportation (Medical): No   Lack of Transportation (Non-Medical): No  Physical Activity: Sufficiently Active   Days of Exercise per Week: 5 days   Minutes of Exercise per Session: 60 min  Stress: No Stress Concern Present   Feeling of Stress : Not at all  Social Connections: Socially Integrated   Frequency of Communication with Friends and Family: More than three times a week   Frequency of Social Gatherings with Friends and Family: More than three times a week   Attends Religious Services: More  than 4 times per year   Active Member of Clubs or Organizations: Yes   Attends Music therapist: More than 4 times per year   Marital Status: Married   Allergies  Allergen Reactions   Estradiol     Other reaction(s): pancreatitis   Pantoprazole Sodium Diarrhea   Pollen Extract    Prednisolone Other (See Comments)    pancreatitis   Family History  Problem Relation Age of Onset   Colon cancer Mother 56   Lymphoma Mother    Cancer Mother 65       lymphoma and colon   Heart disease Father 45   Multiple sclerosis Sister    Depression Sister    Diabetes Sister    Breast cancer Maternal Grandmother    Coronary artery disease Other    Heart attack Other         12-08-2022    Sudden Cardiac Death Other        Current Outpatient Medications (Analgesics):    acetaminophen (TYLENOL) 650 MG CR tablet, Take 650 mg by mouth as needed.   Current Outpatient Medications (Other):    cholecalciferol (VITAMIN D) 1000 UNITS tablet, Take 1,000 Units by mouth daily.   gabapentin (NEURONTIN) 100 MG capsule, Take 1 capsule (100 mg total) by mouth at bedtime.   Reviewed prior external information including notes and imaging from  primary care provider As well as notes that were available from care everywhere and other healthcare systems.  Past medical history, social, surgical and family history all reviewed in electronic medical record.  No pertanent information unless stated regarding to the chief complaint.   Review of Systems:  No headache, visual changes, nausea, vomiting, diarrhea, constipation, dizziness, abdominal pain, skin rash, fevers, chills, night sweats, weight loss, swollen lymph nodes, body aches, joint swelling, chest pain, shortness of breath, mood changes. POSITIVE muscle aches  Objective  Blood pressure 98/62, pulse 67, height '5\' 3"'$  (1.6 m), weight 135 lb (61.2 kg), SpO2 96 %.   General: No apparent distress alert and oriented x3 mood and affect normal, dressed appropriately.  HEENT: Pupils equal, extraocular movements intact  Respiratory: Patient's speak in full sentences and does not appear short of breath  Cardiovascular: No lower extremity edema, non tender, no erythema  Gait normal with good balance and coordination.  MSK: Patient does have tightness with FABER test left greater than right.  Patient does have some mild loss of lordosis.  Mood seems to be stable. Patient's right wrist though does have a mild positive Phalen's and Tinel's sign on the right hand.  No thenar eminence wasting noted.  [id number]; 15 additional minutes spent for Therapeutic exercises as stated in above notes.  This included exercises focusing on stretching,  strengthening, with significant focus on eccentric aspects.   Long term goals include an improvement in range of motion, strength, endurance as well as avoiding reinjury. Patient's frequency would include in 1-2 times a day, 3-5 times a week for a duration of 6-12 weeks. We discussed the anatomy involved, and that carpal tunnel syndrome primarily involves the median nerve, and this typically affects digits one through 3.   We also discussed that mild cases of carpal tunnel syndrome are often improved with night splints.  If symptoms persist it is very reasonable to consider a carpal tunnel injection.   If the patient does have moderate to severe carpal tunnel syndrome based on NCV, then it is certainly reasonable to consider surgical consultation for definitive management possible  carpal tunnel release.  We also discussed his severe carpal tunnel syndrome can lead to permanent nerve impairment even if released. At this point, the patient like to proceed conservatively.  Proper technique shown and discussed handout in great detail with ATC.  All questions were discussed and answered.      Impression and Recommendations:     The above documentation has been reviewed and is accurate and complete Lyndal Pulley, DO

## 2022-02-22 ENCOUNTER — Ambulatory Visit (INDEPENDENT_AMBULATORY_CARE_PROVIDER_SITE_OTHER): Payer: Medicare Other | Admitting: Family Medicine

## 2022-02-22 DIAGNOSIS — G5601 Carpal tunnel syndrome, right upper limb: Secondary | ICD-10-CM | POA: Diagnosis not present

## 2022-02-22 DIAGNOSIS — G5702 Lesion of sciatic nerve, left lower limb: Secondary | ICD-10-CM

## 2022-02-22 NOTE — Patient Instructions (Signed)
Brace at night for 2 weeks Do prescribed exercises at least 3x a week Back is doing fantastic See you again in 2 months

## 2022-02-22 NOTE — Assessment & Plan Note (Signed)
New problem.  Wants to hold on medications.  Home exercises given and brace given today.  Discussed which activities to avoid avoiding significant repetitive flexion of the wrist.  Worsening pain the patient will follow-up in 2 to 3 months and can consider the possibility of injection

## 2022-02-22 NOTE — Assessment & Plan Note (Signed)
Patient is unremarkable at this time.  Has made improvement.  Not taking gabapentin but does have it if she needs breakthrough.  We discussed with patient continue home exercises intermittently.  3 months.

## 2022-04-25 NOTE — Progress Notes (Unsigned)
[place] Phone: [phone] Subjective:    I'm seeing this patient by the request  of:  Burnard Hawthorne, FNP  CC: Left hip and right wrist pain follow-up  [id number]  02/22/2022 New problem.  Wants to hold on medications.  Home exercises given and brace given today.  Discussed which activities to avoid avoiding significant repetitive flexion of the wrist.  Worsening pain the patient will follow-up in 2 to 3 months and can consider the possibility of injection  Patient is unremarkable at this time.  Has made improvement.  Not taking gabapentin but does have it if she needs breakthrough.  We discussed with patient continue home exercises intermittently.  3 months.  Update 04/26/2022 Kelly [name] is a 66 y.o. female coming in with complaint of L hip and R wrist pain. Patient states butt is better. Back is tight in the morning. Stretching helps. Moving in positive direction. Just bought a new mattress. Has been wearing wrist brace at night and has been helping. Realizing that the position of the arm is dependent on if her hand falls asleep.     Past Medical History:  Diagnosis Date   Claustrophobia    Pancreatitis 2009, 2005   Shingles    Shingles outbreak 2006   Past Surgical History:  Procedure Laterality Date   BREAST BIOPSY     Nml per pt , f/u mammo 10/2011   CHOLECYSTECTOMY     COLONOSCOPY WITH PROPOFOL N/A 08/30/2017   Procedure: COLONOSCOPY WITH PROPOFOL;  Surgeon: Lollie Sails, MD;  Location: [facility] ENDOSCOPY;  Service: Endoscopy;  Laterality: N/A;   EXPLORATORY LAPAROTOMY     TRIGGER FINGER RELEASE     VAGINAL DELIVERY  09/18/1992   Social History   Socioeconomic History   Marital status: Married    Spouse name: Not on file   Number of children: 1    Years of education: Not on file   Highest education level: Not on file  Occupational History   Occupation: Nurse Midwife -  [employer]   Tobacco Use   Smoking status: Never   Smokeless tobacco: Never  Vaping Use   Vaping Use: Never used  Substance and Sexual Activity   Alcohol use: Yes    Comment: 6 glasses of wine a month   Drug use: No   Sexual activity: Not on file  Other Topics Concern   Not on file  Social History Narrative   Social research officer, government until 1997   Social Determinants of Health   Financial Resource Strain: [place]  (07/15/2021)   Overall Financial Resource Strain (CARDIA)    Difficulty of Paying Living Expenses: Not hard at all  Food Insecurity: No Food Insecurity (07/15/2021)   Hunger Vital Sign    Worried About Running Out of Food in the Last Year: Never true    [place] in the Last Year: Never true  Transportation Needs: No Transportation Needs (07/15/2021)   PRAPARE - Hydrologist (Medical): No    Lack of Transportation (Non-Medical): No  Physical Activity: Sufficiently Active (07/15/2021)   Exercise Vital Sign    Days of Exercise per Week: 5 days    Minutes of Exercise per Session: 60 min  Stress: No Stress Concern Present (07/15/2021)   [place]    Feeling of Stress : Not at all  Social Connections: [place] (07/15/2021)  Social Licensed conveyancer [NHANES]    Frequency of Communication with Friends and Family: More than three times a week    Frequency of Social Gatherings with Friends and Family: More than three times a week    Attends Religious Services: More than 4 times per year    Active Member of [employer] or Organizations: Yes    Attends Music therapist: More than 4 times per year    Marital Status: Married   Allergies  Allergen Reactions   Estradiol     Other reaction(s): pancreatitis   Pantoprazole Sodium Diarrhea   Pollen Extract    Prednisolone Other (See Comments)    pancreatitis   Family History  Problem Relation Age of Onset    Colon cancer Mother 62   Lymphoma Mother    Cancer Mother 62       lymphoma and colon   Heart disease Father 90   Multiple sclerosis Sister    Depression Sister    Diabetes Sister    Breast cancer Maternal Grandmother    Coronary artery disease Other    Heart attack Other        28-Dec-2022    Sudden Cardiac Death Other        Current Outpatient Medications (Analgesics):    acetaminophen (TYLENOL) 650 MG CR tablet, Take 650 mg by mouth as needed.   Current Outpatient Medications (Other):    cholecalciferol (VITAMIN D) 1000 UNITS tablet, Take 1,000 Units by mouth daily.   gabapentin (NEURONTIN) 100 MG capsule, Take 1 capsule (100 mg total) by mouth at bedtime.    Review of Systems:  No headache, visual changes, nausea, vomiting, diarrhea, constipation, dizziness, abdominal pain, skin rash, fevers, chills, night sweats, weight loss, swollen lymph nodes, body aches, joint swelling, chest pain, shortness of breath, mood changes. POSITIVE muscle aches  Objective  Blood pressure 110/64, pulse 68, height '5\' 3"'$  (1.6 m), weight 137 lb (62.1 kg), SpO2 96 %.   General: No apparent distress alert and oriented x3 mood and affect normal, dressed appropriately.  HEENT: Pupils equal, extraocular movements intact  Respiratory: Patient's speak in full sentences and does not appear short of breath  Cardiovascular: No lower extremity edema, non tender, no erythema  Right wrist positive Tinel's and Phalen signs noted on the right side.  Patient does have some limited range of motion lacking the last 10 degrees of dorsiflexion compared to palmar flexion.  Patient is sitting relatively comfortable.  Mild tightness with FABER test only.     Impression and Recommendations:     The above documentation has been reviewed and is accurate and complete Lyndal Pulley, DO

## 2022-04-26 ENCOUNTER — Ambulatory Visit (INDEPENDENT_AMBULATORY_CARE_PROVIDER_SITE_OTHER): Payer: Medicare Other | Admitting: Family Medicine

## 2022-04-26 DIAGNOSIS — G5601 Carpal tunnel syndrome, right upper limb: Secondary | ICD-10-CM

## 2022-04-26 DIAGNOSIS — M6530 Trigger finger, unspecified finger: Secondary | ICD-10-CM

## 2022-04-26 DIAGNOSIS — G5702 Lesion of sciatic nerve, left lower limb: Secondary | ICD-10-CM | POA: Diagnosis not present

## 2022-04-26 NOTE — Patient Instructions (Addendum)
Good to see you! Try tighter with brace but if worse discontinue For hip have trainer work on abductors  Read on PRP

## 2022-04-26 NOTE — Assessment & Plan Note (Signed)
Still having signs and symptoms consistent with more of a carpal tunnel.  Patient was evaluated any type of injection secondary to patient having pancreatitis with steroid injections.  Given handout though to discuss the possibility of PRP injections if needed.  Encourage patient to continue to wear the brace at night if needed.  Follow-up again in 52-month

## 2022-04-26 NOTE — Assessment & Plan Note (Signed)
Now right middle finger at the A2 pulley.  Discussed the possibility of injection once again wants to avoid steroids.  Patient will do more massage and heat.  Follow-up with me again in 2 months

## 2022-04-26 NOTE — Assessment & Plan Note (Signed)
Still need to work on hip abductor strengthening.  Discussed continuing to work with a Clinical research associate.  Discussed manual massage in the area that can be helpful.  Worsening pain could consider PRP injection as well.

## 2022-04-27 NOTE — Progress Notes (Signed)
Cardiology Clinic Note   Patient Name: Kelly [name] Date of Encounter: 04/28/2022  Primary Care Provider:  Burnard Hawthorne, FNP Primary Cardiologist:  Nelva Bush, MD  Patient Profile    66 year old female with a history of chronic small pericardial effusion, frequent PVCs, pancreatitis, shingles, and claustrophobia who presents for follow-up on palpitations.  Past Medical History    Past Medical History:  Diagnosis Date   Claustrophobia    Pancreatitis 2009, 2005   Shingles    Shingles outbreak 2006   Past Surgical History:  Procedure Laterality Date   BREAST BIOPSY     Nml per pt , f/u mammo 10/2011   CHOLECYSTECTOMY     COLONOSCOPY WITH PROPOFOL N/A 08/30/2017   Procedure: COLONOSCOPY WITH PROPOFOL;  Surgeon: Lollie Sails, MD;  Location: [facility] ENDOSCOPY;  Service: Endoscopy;  Laterality: N/A;   EXPLORATORY LAPAROTOMY     TRIGGER FINGER RELEASE     VAGINAL DELIVERY  09/18/1992    Allergies  Allergies  Allergen Reactions   Estradiol     Other reaction(s): pancreatitis   Pantoprazole Sodium Diarrhea   Pollen Extract    Prednisolone Other (See Comments)    pancreatitis    History of Present Illness    66 year old female with the above aforementioned past medical history of chronic small pericardial effusion, frequent PVCs, pancreatitis, shingles, and claustrophobia.  Autoimmune testing including CRP, sedimentation rate, and ANA were negative as part of the workup for her pericardial effusion.  She was last seen in clinic in 08/19/2021 feeling fairly well.  She had complaints of tightness around the lower part of her rib cage and seem to be positional.  She had also lost about 10 pounds due to increased walking exercise.  Echocardiogram in 08/21/19/2020 revealed LVEF of 60 to 65% with G2 DD, mild to moderate tricuspid regurgitation, and a small pericardial effusion.  12/31 2020 she did wear a long-term monitor that confirmed frequent PVCs and PVC  burden approximately 15% she was subsequently scheduled for stress test at that time.  Myoview Lexiscan on 10/31/2019 revealed normal wall motion EF estimated at 69%, PVCs noted at rest and during infusion, low risk scan.  She returns to clinic today with concerns of palpitations. She states that she has had 4 episodes since June where she had gone to her son's wedding those were likely related to stress she thought that she has had 2 with no offending factors.  Heart rate goes up to around 120 with the longest episode that she had lasting 5 hours.  She denies chest pain, shortness of breath, but did have associated nausea with her palpitations.  She denies any recent hospitalizations or visits to the emergency department.  Home Medications    Current Outpatient Medications  Medication Sig Dispense Refill   acetaminophen (TYLENOL) 650 MG CR tablet Take 650 mg by mouth as needed.     cholecalciferol (VITAMIN D) 1000 UNITS tablet Take 1,000 Units by mouth daily.     gabapentin (NEURONTIN) 100 MG capsule Take 1 capsule (100 mg total) by mouth at bedtime. 90 capsule 0   No current facility-administered medications for this visit.     Family History    Family History  Problem Relation Age of Onset   Colon cancer Mother 24   Lymphoma Mother    Cancer Mother 15       lymphoma and colon   Heart disease Father 84   Multiple sclerosis Sister    Depression  Sister    Diabetes Sister    Breast cancer Maternal Grandmother    Coronary artery disease Other    Heart attack Other        40's    Sudden Cardiac Death Other    She indicated that her mother is deceased. She indicated that her father is deceased. She indicated that her sister is alive. She indicated that her brother is alive. She indicated that the status of her maternal grandmother is unknown. She indicated that the status of her other is unknown.  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[place] Strain: [place]  (07/15/2021)   Overall Financial Resource Strain (CARDIA)    Difficulty of Paying Living Expenses: Not hard at all  Food Insecurity: No Food Insecurity (07/15/2021)   Hunger Vital Sign    Worried About Running Out of Food in the Last Year: Never true    [place] in the Last Year: Never true  Transportation Needs: No Transportation Needs (07/15/2021)   PRAPARE - Hydrologist (Medical): No    Lack of Transportation (Non-Medical): No  Physical Activity: Sufficiently Active (07/15/2021)   Exercise Vital Sign    Days of Exercise per Week: 5 days    Minutes of Exercise per Session: 60 min  Stress: No Stress Concern Present (07/15/2021)   [place]    Feeling of Stress : Not at all  Social Connections: [place] (07/15/2021)   Social Connection and Isolation Panel [NHANES]    Frequency of Communication with Friends and Family: More than three times a week    Frequency of Social Gatherings with Friends and Family: More than three times a week    Attends Religious Services: More than 4 times per year    Active Member of [employer] or Organizations: Yes    Attends Music therapist: More than 4 times per year    Marital Status: Married  Human resources officer Violence: Not At Risk (07/15/2021)    Humiliation, Afraid, Rape, and Kick questionnaire    Fear of Current or Ex-Partner: No    Emotionally Abused: No    Physically Abused: No    Sexually Abused: No     Review of Systems    General:  No chills, fever, night sweats or weight changes.  Cardiovascular:  No chest pain, dyspnea on exertion, edema, orthopnea, endorses palpitations, paroxysmal nocturnal dyspnea. Dermatological: No rash, lesions/masses Respiratory: No cough, dyspnea Urologic: No hematuria, dysuria Abdominal:   No nausea, vomiting, diarrhea, bright red blood per rectum, melena, or hematemesis Neurologic:  No visual changes, wkns, changes in mental status. All other systems reviewed and are otherwise  negative except as noted above.     Physical Exam    VS:  BP 108/66 (BP Location: Left Arm, Patient Position: Sitting, Cuff Size: Normal)   Pulse 67   Ht '5\' 3"'$  (1.6 m)   Wt 136 lb (61.7 kg)   SpO2 96%   BMI 24.09 kg/m  , BMI Body mass index is 24.09 kg/m.     GEN: Well nourished, well developed, in no acute distress. HEENT: normal. Neck: Supple, no JVD, carotid bruits, or masses. Cardiac: RRR, no murmurs, rubs, or gallops. No clubbing, cyanosis, edema.  Radials/DP/PT 2+ and equal bilaterally.  Respiratory:  Respirations regular and unlabored, clear to auscultation bilaterally. GI: Soft, nontender, nondistended, BS + x 4. MS: no deformity or atrophy. Skin: warm and dry, no rash. Neuro:  Strength and sensation are intact. Psych: Normal affect.  Accessory Clinical Findings    ECG personally reviewed by me today-sinus rhythm rate of 67- No acute changes  Lab Results  Component Value Date   WBC 5.7 08/19/2021   HGB 13.3 08/19/2021   HCT 39.7 08/19/2021   MCV 99 (H) 08/19/2021   PLT 211 08/19/2021   Lab Results  Component Value Date   CREATININE 0.59 08/19/2021   BUN 21 08/19/2021   NA 140 08/19/2021   K 4.0 08/19/2021   CL 105 08/19/2021   CO2 22 08/19/2021   Lab Results  Component Value Date    ALT 17 08/19/2021   AST 21 08/19/2021   ALKPHOS 79 08/19/2021   BILITOT 0.5 08/19/2021   Lab Results  Component Value Date   CHOL 272 (H) 08/19/2021   HDL 86 08/19/2021   LDLCALC 171 (H) 08/19/2021   LDLDIRECT 151.3 10/19/2011   TRIG 91 08/19/2021   CHOLHDL 3.2 08/19/2021    No results found for: "HGBA1C"  Assessment & Plan   1.  Palpitations had several episodes since June.  She was concerned with the palpitations and felt it was noted that her heart was beating out of her chest.  She denies any chest discomfort, shortness of breath but did have associated nausea without vomiting with the episodes of palpitations.  The longest episode that she had lasted approximately 5 hours.  She did relate 2 of her episodes distressing to her unfounded.  She previously wore a monitor back in 2020 which showed a high PVC burden.  Those symptoms had resolved lasted December.  With her symptoms today of worsening palpitations she be placed on a ZIO XT monitor for 14 days.  Did discuss triggering factors of caffeine which she has decreased almost to nothing, we did discuss anemia last CBC she sees show stable hemoglobin with no anemia, we did discuss that stress is another trigger which 2 of her episodes were triggered by stress.  Will repeat labs on her return CBC, BMP, and TSH.  2.  History of PVCs with a heart monitor for 1 in 2020 that revealed predominantly sinus rhythm with rare PACs and frequent PVCs.  Single episode of brief PSVT was noted.  There was a 15% burden of PVCs and she underwent Myoview Lexiscan which was low risk scan without any further ischemia workup needed.  EKG today shows sinus rhythm without PVCs or PACs.  3.  Hyperlipidemia with LDL of 171 on 08/19/2021.  Has increased exercise and making dietary changes will recheck lipid panel on return appointment.  4.  History of pericardial effusion continued small pericardial effusion on her last echocardiogram of 08/2019.  Autoimmune  workup was unrevealing.  No further workup was recommended at this time.  If ZIO monitor is unrevealing could consider repeat echocardiogram.  5.  Disposition-return to clinic in 6 weeks to see MD/APP.  Lekeshia Kram, NP 04/28/2022, 8:46 AM

## 2022-04-28 ENCOUNTER — Ambulatory Visit (INDEPENDENT_AMBULATORY_CARE_PROVIDER_SITE_OTHER): Payer: Medicare Other | Admitting: Cardiology

## 2022-04-28 ENCOUNTER — Encounter: Payer: Self-pay | Admitting: Cardiology

## 2022-04-28 ENCOUNTER — Ambulatory Visit (INDEPENDENT_AMBULATORY_CARE_PROVIDER_SITE_OTHER): Payer: Medicare Other

## 2022-04-28 VITALS — BP 108/66 | HR 67 | Ht 63.0 in | Wt 136.0 lb

## 2022-04-28 DIAGNOSIS — E782 Mixed hyperlipidemia: Secondary | ICD-10-CM

## 2022-04-28 DIAGNOSIS — I493 Ventricular premature depolarization: Secondary | ICD-10-CM | POA: Diagnosis not present

## 2022-04-28 DIAGNOSIS — R002 Palpitations: Secondary | ICD-10-CM

## 2022-04-28 DIAGNOSIS — I3139 Other pericardial effusion (noninflammatory): Secondary | ICD-10-CM

## 2022-04-28 NOTE — Patient Instructions (Signed)
Medication Instructions:  Your physician recommends that you continue on your current medications as directed. Please refer to the Current Medication list given to you today.   *If you need a refill on your cardiac medications before your next appointment, please call your pharmacy*   Lab Work: None ordered  If you have labs (blood work) drawn today and your tests are completely normal, you will receive your results only by: [place] (if you have MyChart) OR A paper copy in the mail If you have any lab test that is abnormal or we need to change your treatment, we will call you to review the results.   Testing/Procedures:  Your provider has ordered a heart monitor to wear for 14 days. This will be mailed to your home with instructions on placement. Once you have finished the time frame requested, you will return monitor in box provided.      Follow-Up: At [facility], you and your health needs are our priority.  As part of our continuing mission to provide you with exceptional heart care, we have created designated Provider Care Teams.  These Care Teams include your primary Cardiologist (physician) and Advanced Practice Providers (APPs -  Physician Assistants and Nurse Practitioners) who all work together to provide you with the care you need, when you need it.  We recommend signing up for the patient portal called "MyChart".  Sign up information is provided on this After Visit Summary.  MyChart is used to connect with patients for Virtual Visits (Telemedicine).  Patients are able to view lab/test results, encounter notes, upcoming appointments, etc.  Non-urgent messages can be sent to your provider as well.   To learn more about what you can do with MyChart, go to [email or web address].    Your next appointment:   6 week(s)  The format for your next appointment:   In Person  Provider:   You may see Nelva Bush, MD or one of the following Advanced Practice Providers  on your designated Care Team:   Murray Hodgkins, NP Christell Faith, PA-C Cadence Kathlen Mody, [place]   Other Instructions N/A  Important Information About [place]

## 2022-05-03 DIAGNOSIS — R002 Palpitations: Secondary | ICD-10-CM | POA: Diagnosis not present

## 2022-05-08 ENCOUNTER — Telehealth: Payer: Self-pay | Admitting: Cardiology

## 2022-05-08 NOTE — Telephone Encounter (Signed)
Patient is calling reporting her heart monitor fell off 4x in 2 days and her dog got a hold of it the last time. She has mailed the monitor back to [place], but is requesting the new one isn't mailed until it cools off so it is able to stay on. She is currently training for a golf tournament and wasn't able to keep it on once it fail off due to sweating.

## 2022-06-08 ENCOUNTER — Ambulatory Visit: Payer: Medicare Other | Admitting: Cardiology

## 2022-06-09 ENCOUNTER — Ambulatory Visit: Payer: Medicare Other | Admitting: Cardiology

## 2022-07-03 ENCOUNTER — Telehealth: Payer: Self-pay | Admitting: *Deleted

## 2022-07-03 NOTE — Telephone Encounter (Signed)
Left voicemail message to call back for results.  

## 2022-07-03 NOTE — Telephone Encounter (Signed)
-----   Message from Gerrie Nordmann, NP sent at 06/29/2022  4:34 PM EDT ----- Monitor reassuring with average heart rate of 69 bpm, rare early beats, and only one run of 11 beats where the heart was speeding up. Continue current medication regimen and keep follow up.

## 2022-07-03 NOTE — Telephone Encounter (Signed)
Reviewed results and recommendations with patient. Patient stated that she is due for appointment but will call us back to set that up. She had no further questions at this time

## 2022-07-03 NOTE — Telephone Encounter (Signed)
Pt is returning call. Transferred to Jule Ser, RN

## 2022-07-04 NOTE — Progress Notes (Unsigned)
Kelly Kelly [name] Phone: [phone] Subjective:   Kelly Kelly [name], am serving as a scribe for Dr. Hulan [name].  I'm seeing this patient by the request  of:  Kelly Hawthorne, FNP  CC:   Kelly Kelly [name]  04/26/2022 Still need to work on hip abductor strengthening.  Discussed continuing to work with a Clinical research associate.  Discussed manual massage in the area that can be helpful.  Worsening pain could consider PRP injection as well.  Now right middle finger at the A2 Kelly [name].  Discussed the possibility of injection once again wants to avoid steroids.  Patient will do more massage and heat.  Follow-up with me again in 2 months  Still having signs and symptoms consistent with more of a carpal tunnel.  Patient was evaluated any type of injection secondary to patient having pancreatitis with steroid injections.  Given handout though to discuss the possibility of PRP injections if needed.  Encourage patient to continue to wear the brace at night if needed.  Follow-up again in 44-month Update 07/05/2022 Kelly Kelly [name] a 66y.o. Kelly [name] coming in with complaint of L hip, trigger finger, and R carpal tunnel. Patient states that she still wears brace at night. Does have some numbness at night with brace. Overall her pain has improved.   Continues to have triggering of middle finger on R hand and ring finger on L hand.   Pain in L hip is doing much better with stretching.        Past Medical History:  Diagnosis Date   Claustrophobia    Pancreatitis 2009, 2005   Shingles    Shingles outbreak 2006   Past Surgical History:  Procedure Laterality Date   BREAST BIOPSY     Nml per pt , f/u mammo 10/2011   CHOLECYSTECTOMY     COLONOSCOPY WITH PROPOFOL N/A 08/30/2017   Procedure: COLONOSCOPY WITH PROPOFOL;  Surgeon: SLollie Sails MD;  [facility];  Service: Endoscopy;  Laterality: N/A;   EXPLORATORY LAPAROTOMY      TRIGGER FINGER RELEASE     VAGINAL DELIVERY  09/18/1992   Social History   Socioeconomic History   Marital status: Married    Spouse name: Not on file   Number of children: 1    Years of education: Not on file   Highest education level: Not on file  Occupational History   Occupation: Nurse Midwife - [employer]   Tobacco Use   Smoking status: Never   Smokeless tobacco: Never  Vaping Use   Vaping Use: Never used  Substance and Sexual Activity   Alcohol use: Yes    Comment: 6 glasses of wine a month   Drug use: Kelly [name]   Sexual activity: Not on file  Other Topics Concern   Not on file  Social History Narrative   ASocial research officer, governmentuntil 1997   Social Determinants of Health   Financial Resource Strain: Kelly Kelly [name] (07/15/2021)   Overall Financial Resource Strain (CARDIA)    Difficulty of Paying Living Expenses: Not hard at all  Food Kelly [name]: Kelly Kelly [name] (07/15/2021)   Hunger Vital Sign    Worried About Running Out of Food in the Last [name]: Never true    Kelly Kelly [name]: Never true  Kelly Kelly [name]: Kelly [name] Kelly Kelly [name] (07/15/2021)   Kelly Kelly [name](Medical): Kelly [name]    Lack of Kelly (Non-Medical): Kelly [name]  Physical Activity: Sufficiently Active (07/15/2021)   Exercise Vital Sign    Days of Exercise per Week: 5 days    Minutes of Exercise per Session: 60 min  Stress: Kelly [name] Stress Concern Present (07/15/2021)   [place]    Feeling of Stress : Not at all  Social Connections: Kelly Kelly [name] (07/15/2021)   Social Connection and Isolation Panel [NHANES]    Frequency of Communication with Friends and Family: More than three times a week    Frequency of Social Gatherings with Friends and Family: More than three times a week    Attends Religious Services: More than 4 times per [name]    Active Member of [employer] or Organizations: Yes    Attends Programme researcher, broadcasting/film/video: More than 4 times per [name]    Marital Status: Married   Allergies  Allergen Reactions   Estradiol     Other reaction(s): pancreatitis   Pantoprazole Sodium Diarrhea   Pollen Extract    Prednisolone Other (See Comments)    pancreatitis   Family History  Problem Relation Age of Onset   Colon cancer Mother 58   Lymphoma Mother    Cancer Mother 75       lymphoma and colon   Heart disease Father 55   Multiple sclerosis Sister    Depression Sister    Diabetes Sister    Breast cancer Maternal Grandmother    Coronary artery disease Other    Heart attack Other        Dec 02, 2022    Sudden Cardiac Death Other        Current Outpatient Medications (Analgesics):    acetaminophen (TYLENOL) 650 MG CR tablet, Take 650 mg by mouth as needed.   Current Outpatient Medications (Other):    cholecalciferol (VITAMIN D) 1000 UNITS tablet, Take 1,000 Units by mouth daily.   gabapentin (NEURONTIN) 100 MG capsule, Take 1 capsule (100 mg total) by mouth at bedtime.    Objective  Blood pressure 112/72, pulse 80, height '5\' 3"'$  (1.6 m), weight 136 lb (61.7 kg), SpO2 97 %.   General: Kelly [name] apparent distress alert and oriented x3 mood and affect normal, dressed appropriately.  HEENT: Pupils equal, extraocular movements intact  Respiratory: Patient's speak in full sentences and does not appear short of breath  Cardiovascular: Kelly [name] lower extremity edema, non tender, Kelly [name] erythema  Patient on exam still has a trigger nodule noted at the A2 Kelly [name] on the middle finger as well is the ring finger on the contralateral side.  Patient does have mild positive Tinel's still noted.  Limited muscular skeletal ultrasound was performed and interpreted by Kelly Kelly [name], M  Limited ultrasound still shows the patient's carpal tunnel and median nerve does have some hypoechoic changes noted.  Patient still has trigger nodule noted with in the flexor tendon sheath of the middle finger on the right  hand. Impression: Relatively stable    Impression and Recommendations:     The above documentation has been reviewed and is accurate and complete Kelly Pulley, DO

## 2022-07-05 ENCOUNTER — Encounter: Payer: Self-pay | Admitting: Family Medicine

## 2022-07-05 ENCOUNTER — Ambulatory Visit (INDEPENDENT_AMBULATORY_CARE_PROVIDER_SITE_OTHER): Payer: Medicare Other | Admitting: Family Medicine

## 2022-07-05 ENCOUNTER — Ambulatory Visit: Payer: Medicare Other

## 2022-07-05 VITALS — BP 112/72 | HR 80 | Ht 63.0 in | Wt 136.0 lb

## 2022-07-05 DIAGNOSIS — G5601 Carpal tunnel syndrome, right upper limb: Secondary | ICD-10-CM

## 2022-07-05 NOTE — Assessment & Plan Note (Signed)
Discussed with patient again.  Ultrasound does show potential beneficial decrease in size of the median nerve.  Continue conservative therapy.  Patient declined any type of injection wanting to avoid steroids secondary to the potential association with the pancreatitis.  Discussed with patient about PRP or surgical intervention.  Can follow-up with me in 2 to

## 2022-07-05 NOTE — Patient Instructions (Signed)
Glad your are doing well Consider PRP for all problems See me in 2-3 months just in case  Otherwise see me when you need me

## 2022-07-17 ENCOUNTER — Ambulatory Visit (INDEPENDENT_AMBULATORY_CARE_PROVIDER_SITE_OTHER): Payer: Medicare Other

## 2022-07-17 VITALS — Ht 63.0 in | Wt 136.0 lb

## 2022-07-17 DIAGNOSIS — Z0000 Encounter for general adult medical examination without abnormal findings: Secondary | ICD-10-CM

## 2022-07-17 NOTE — Telephone Encounter (Signed)
Message sent to [employer] to credit prior monitor and send out a new one.  Rock Nephew, Santiago Bumpers, RN Caller: Unspecified (2 months ago) Samul Dada,  Looks like this patient has a follow up appointment 12/14 with Harrell Gave End.  It looks like she had a 14 day ZIO XT monitor applied at the [place] office in August by Mila Merry, which fell off prematurely due to hot weather.  She had requested a redo when the weather cooled down.  Since she is already on the [facility] monitor status report, someone from your office should probably request charges on the first monitor be cancelled and a replacement mailed to the patient.  Thanks,  [employer]

## 2022-07-17 NOTE — Patient Instructions (Addendum)
[name] , Thank you for taking time to come for your Medicare Wellness Visit. I appreciate your ongoing commitment to your health goals. Please review the following plan we discussed and let me know if I can assist you in the future.   These are the goals we discussed:  Goals      Maintain Healthy Lifestyle     Stay active. Healthy diet.        This is a list of the screening recommended for you and due dates:  Health Maintenance  Topic Date Due   Flu Shot  07/25/2022*   COVID-19 Vaccine (5 - Pfizer series) 08/02/2022*   Zoster (Shingles) Vaccine (1 of 2) 10/17/2022*   Colon Cancer Screening  08/30/2022   Medicare Annual Wellness Visit  07/18/2023   Mammogram  09/23/2023   Tetanus Vaccine  01/30/2029   Pneumonia Vaccine  Completed   DEXA scan (bone density measurement)  Completed   Hepatitis C Screening: USPSTF Recommendation to screen - Ages 66-79 yo.  Completed   HPV Vaccine  Aged Out  *Topic was postponed. The date shown is not the original due date.    Advanced directives: End of life planning; Advance aging; Advanced directives discussed.  Copy of current HCPOA/Living Will requested.    Conditions/risks identified: none new  Next appointment: Follow up in one year for your annual wellness visit.  Preventive Care 68 Years and Older, Female Preventive care refers to lifestyle choices and visits with your health care provider that can promote health and wellness. What does preventive care include? A yearly physical exam. This is also called an annual well check. Dental exams once or twice a year. Routine eye exams. Ask your health care provider how often you should have your eyes checked. Personal lifestyle choices, including: Daily care of your teeth and gums. Regular physical activity. Eating a healthy diet. Avoiding tobacco and drug use. Limiting alcohol use. Practicing safe sex. Taking low-dose aspirin every day. Taking vitamin and mineral supplements as  recommended by your health care provider. What happens during an annual well check? The services and screenings done by your health care provider during your annual well check will depend on your age, overall health, lifestyle risk factors, and family history of disease. Counseling  Your health care provider may ask you questions about your: Alcohol use. Tobacco use. Drug use. Emotional well-being. Home and relationship well-being. Sexual activity. Eating habits. History of falls. Memory and ability to understand (cognition). Work and work Statistician. Reproductive health. Screening  You may have the following tests or measurements: Height, weight, and BMI. Blood pressure. Lipid and cholesterol levels. These may be checked every 5 years, or more frequently if you are over 49 years old. Skin check. Lung cancer screening. You may have this screening every year starting at age 68 if you have a 30-pack-year history of smoking and currently smoke or have quit within the past 15 years. Fecal occult blood test (FOBT) of the stool. You may have this test every year starting at age 68. Flexible sigmoidoscopy or colonoscopy. You may have a sigmoidoscopy every 5 years or a colonoscopy every 10 years starting at age 68. Hepatitis C blood test. Hepatitis B blood test. Sexually transmitted disease (STD) testing. Diabetes screening. This is done by checking your blood sugar (glucose) after you have not eaten for a while (fasting). You may have this done every 1-3 years. Bone density scan. This is done to screen for osteoporosis. You may have this done  starting at age 68. Mammogram. This may be done every 1-2 years. Talk to your health care provider about how often you should have regular mammograms. Talk with your health care provider about your test results, treatment options, and if necessary, the need for more tests. Vaccines  Your health care provider may recommend certain vaccines, such  as: Influenza vaccine. This is recommended every year. Tetanus, diphtheria, and acellular pertussis (Tdap, Td) vaccine. You may need a Td booster every 10 years. Zoster vaccine. You may need this after age 68. Pneumococcal 13-valent conjugate (PCV13) vaccine. One dose is recommended after age 68. Pneumococcal polysaccharide (PPSV23) vaccine. One dose is recommended after age 68. Talk to your health care provider about which screenings and vaccines you need and how often you need them. This information is not intended to replace advice given to you by your health care provider. Make sure you discuss any questions you have with your health care provider. Document Released: 10/01/2015 Document Revised: 05/24/2016 Document Reviewed: 07/06/2015 Elsevier Interactive Patient Education  2017 [place] Prevention in the Home Falls can cause injuries. They can happen to people of all ages. There are many things you can do to make your home safe and to help prevent falls. What can I do on the outside of my home? Regularly fix the edges of walkways and driveways and fix any cracks. Remove anything that might make you trip as you walk through a door, such as a raised step or threshold. Trim any bushes or trees on the path to your home. Use bright outdoor lighting. Clear any walking paths of anything that might make someone trip, such as rocks or tools. Regularly check to see if handrails are loose or broken. Make sure that both sides of any steps have handrails. Any raised decks and porches should have guardrails on the edges. Have any leaves, snow, or ice cleared regularly. Use sand or salt on walking paths during winter. Clean up any spills in your garage right away. This includes oil or grease spills. What can I do in the bathroom? Use night lights. Install grab bars by the toilet and in the tub and shower. Do not use towel bars as grab bars. Use non-skid mats or decals in the tub or  shower. If you need to sit down in the shower, use a plastic, non-slip stool. Keep the floor dry. Clean up any water that spills on the floor as soon as it happens. Remove soap buildup in the tub or shower regularly. Attach bath mats securely with double-sided non-slip rug tape. Do not have throw rugs and other things on the floor that can make you trip. What can I do in the bedroom? Use night lights. Make sure that you have a light by your bed that is easy to reach. Do not use any sheets or blankets that are too big for your bed. They should not hang down onto the floor. Have a firm chair that has side arms. You can use this for support while you get dressed. Do not have throw rugs and other things on the floor that can make you trip. What can I do in the kitchen? Clean up any spills right away. Avoid walking on wet floors. Keep items that you use a lot in easy-to-reach places. If you need to reach something above you, use a strong step stool that has a grab bar. Keep electrical cords out of the way. Do not use floor polish or wax that  makes floors slippery. If you must use wax, use non-skid floor wax. Do not have throw rugs and other things on the floor that can make you trip. What can I do with my stairs? Do not leave any items on the stairs. Make sure that there are handrails on both sides of the stairs and use them. Fix handrails that are broken or loose. Make sure that handrails are as long as the stairways. Check any carpeting to make sure that it is firmly attached to the stairs. Fix any carpet that is loose or worn. Avoid having throw rugs at the top or bottom of the stairs. If you do have throw rugs, attach them to the floor with carpet tape. Make sure that you have a light switch at the top of the stairs and the bottom of the stairs. If you do not have them, ask someone to add them for you. What else can I do to help prevent falls? Wear shoes that: Do not have high heels. Have  rubber bottoms. Are comfortable and fit you well. Are closed at the toe. Do not wear sandals. If you use a stepladder: Make sure that it is fully opened. Do not climb a closed stepladder. Make sure that both sides of the stepladder are locked into place. Ask someone to hold it for you, if possible. Clearly mark and make sure that you can see: Any grab bars or handrails. First and last steps. Where the edge of each step is. Use tools that help you move around (mobility aids) if they are needed. These include: Canes. Walkers. Scooters. Crutches. Turn on the lights when you go into a dark area. Replace any light bulbs as soon as they burn out. Set up your furniture so you have a clear path. Avoid moving your furniture around. If any of your floors are uneven, fix them. If there are any pets around you, be aware of where they are. Review your medicines with your doctor. Some medicines can make you feel dizzy. This can increase your chance of falling. Ask your doctor what other things that you can do to help prevent falls. This information is not intended to replace advice given to you by your health care provider. Make sure you discuss any questions you have with your health care provider. Document Released: 07/01/2009 Document Revised: 02/10/2016 Document Reviewed: 10/09/2014 Elsevier Interactive Patient Education  2017 [employer].

## 2022-07-17 NOTE — Progress Notes (Signed)
Subjective:   Kelly [name] is a 66 y.o. female who presents for Medicare Annual (Subsequent) preventive examination.  Review of Systems    No ROS.  Medicare Wellness Virtual Visit.  Visual/audio telehealth visit, UTA vital signs.   See social history for additional risk factors.   Cardiac Risk Factors include: advanced age (>66mn, >>83women)     Objective:    Today's Vitals   07/17/22 1548  Weight: 136 lb (61.7 kg)  Height: '5\' 3"'$  (1.6 m)   Body mass index is 24.09 kg/m.     07/17/2022    3:51 PM 07/15/2021    9:50 AM 07/14/2020    9:43 AM 08/30/2017    9:33 AM  Advanced Directives  Does Patient Have a Medical Advance Directive? Yes Yes No Yes  Type of [place]  Living will  Does patient want to make changes to medical advance directive? No - Patient declined No - Patient declined    Copy of [place] Chart? No - copy requested No - copy requested      Current Medications (verified) Outpatient Encounter Medications as of 07/17/2022  Medication Sig   acetaminophen (TYLENOL) 650 MG CR tablet Take 650 mg by mouth as needed.   cholecalciferol (VITAMIN D) 1000 UNITS tablet Take 1,000 Units by mouth daily.   gabapentin (NEURONTIN) 100 MG capsule Take 1 capsule (100 mg total) by mouth at bedtime.   No facility-administered encounter medications on file as of 07/17/2022.    Allergies (verified) Estradiol, Pantoprazole sodium, Pollen extract, and Prednisolone   History: Past Medical History:  Diagnosis Date   Claustrophobia    Pancreatitis 2009, 2005   Shingles    Shingles outbreak 204-06-06  Past Surgical History:  Procedure Laterality Date   BREAST BIOPSY     Nml per pt , f/u mammo 10/2011   CHOLECYSTECTOMY     COLONOSCOPY WITH PROPOFOL N/A 08/30/2017   Procedure: COLONOSCOPY WITH PROPOFOL;  Surgeon: SLollie Sails MD;  [facility];  Service: Endoscopy;  Laterality: N/A;   EXPLORATORY LAPAROTOMY     TRIGGER FINGER RELEASE     VAGINAL DELIVERY  09/18/1992   Family History  Problem Relation Age of Onset   Colon cancer Mother 640  Lymphoma Mother    Cancer Mother 459      lymphoma and colon   Heart disease Father 575  Multiple sclerosis Sister    Depression Sister    Diabetes Sister    Breast cancer Maternal Grandmother    Coronary artery disease Other    Heart attack Other        406-Apr-2024   Sudden Cardiac Death Other    Social History   Socioeconomic History   Marital status: Married    Spouse name: Not on file   Number of children: 1    Years of education: Not on file   Highest education level: Not on file  Occupational History   Occupation: Nurse Midwife - [employer]   Tobacco Use   Smoking status: Never   Smokeless tobacco: Never  Vaping Use   Vaping Use: Never used  Substance and Sexual Activity   Alcohol use: Yes    Comment: 6 glasses of wine a month   Drug use: No   Sexual activity: Not on file  Other Topics Concern   Not on file  Social History Narrative  Social research officer, government until [place] Strain: Low Risk  (07/17/2022)   Overall Financial Resource Strain (CARDIA)    Difficulty of Paying Living Expenses: Not hard at all  Food Insecurity: No Food Insecurity (07/17/2022)   Hunger Vital Sign    Worried About Running Out of Food in the Last Year: Never true    Ran Out of Food in the Last Year: Never true  Transportation Needs: No Transportation Needs (07/17/2022)   PRAPARE - Hydrologist (Medical): No    Lack of Transportation (Non-Medical): No  Physical Activity: Sufficiently Active (07/17/2022)   Exercise Vital Sign    Days of Exercise per Week: 5 days    Minutes of Exercise per Session: 60 min  Stress: No Stress Concern Present (07/17/2022)   [place]    Feeling of Stress : Not at all  Social Connections: [place] (07/17/2022)   Social Connection and Isolation Panel [NHANES]    Frequency of Communication with Friends and Family: More than three times a week    Frequency of Social Gatherings with Friends and Family: More than three times a week    Attends Religious Services: More than 4 times per year    Active Member of [employer] or Organizations: Yes    Attends Music therapist: More than 4 times per year    Marital Status: Married    Tobacco Counseling Counseling given: Not Answered   Clinical Intake:  Pre-visit preparation completed: Yes        Diabetes: No  How often do you need to have someone help you when you read instructions, pamphlets, or other written materials from your doctor or pharmacy?: 1 - Never  Interpreter Needed?: No     Activities of Daily Living    07/17/2022    3:53 PM  In your present state of health, do you have any difficulty performing the following activities:  Hearing? 0  Vision? 0  Difficulty concentrating or making decisions? 0  Walking or climbing stairs? 0  Dressing or bathing? 0  Doing errands, shopping? 0  Preparing Food and eating ? N  Using the Toilet? N  In the past six months, have you accidently leaked urine? N  Do you have problems with loss of bowel control? N  Managing your Medications? N  Managing your Finances? N  Housekeeping or managing your Housekeeping? N    Patient Care Team: Burnard Hawthorne, FNP as PCP - General (Family Medicine) End, Harrell Gave, MD as PCP - Cardiology (Cardiology) End, Harrell Gave, MD as Consulting Physician (Cardiology)  Indicate any recent Medical Services you may have received from other than [facility] providers in the past year (date may be approximate).     Assessment:   This is a routine wellness examination for Proctor.  I connected with  [name] on 07/17/22 by a audio enabled  telemedicine application and verified that I am speaking with the correct person using two identifiers.  Patient Location: Home  Provider Location: Office/Clinic  I discussed the limitations of evaluation and management by telemedicine. The patient expressed understanding and agreed to proceed.   Hearing/Vision screen Hearing Screening - Comments:: Patient is able to hear conversational tones.  Vision Screening - Comments:: Followed by [facility] Wears corrective lenses  They have seen their ophthalmologist in the last 12 months.   Dietary issues and exercise activities  discussed: Current Exercise Habits: Home exercise routine, Intensity: Moderate  Healthy diet Good water intake   Goals Addressed             This Visit's Progress    Maintain Healthy Lifestyle       Stay active. Healthy diet.       Depression Screen    07/17/2022    3:53 PM 07/15/2021    9:52 AM 07/14/2020    9:38 AM 07/03/2019    1:59 PM 02/18/2018    9:02 AM 01/31/2017   11:08 AM 12/07/2015   10:55 AM  PHQ 2/9 Scores  PHQ - 2 Score 0 0 0 0 0 0 0  PHQ- 9 Score    0 1      Fall Risk    07/17/2022    3:53 PM 07/15/2021   10:04 AM 07/14/2020    9:44 AM 05/04/2020   10:36 AM 07/03/2019    1:59 PM  [place] in the past year? 0 0 0 0 0  Number falls in past yr: 0 0 0  0  Injury with Fall? 0    0  Risk for fall due to : No Fall Risks      Follow up Falls evaluation completed  Falls evaluation completed Falls evaluation completed Falls evaluation completed    [place]: Home free of loose throw rugs in walkways, pet beds, electrical cords, etc? Yes  Adequate lighting in your home to reduce risk of falls? Yes   ASSISTIVE DEVICES UTILIZED TO PREVENT FALLS: Life alert? No  Use of a cane, walker or w/c? No   TIMED UP AND GO: Was the test performed? No .   Cognitive Function:        07/17/2022    4:00 PM 07/14/2020    9:44 AM  6CIT  Screen  What Year?  0 points  What month?  0 points  What time?  0 points  Count back from 20  0 points  Months in reverse 0 points 0 points    Immunizations Immunization History  Administered Date(s) Administered   DTaP 01/31/2019   Fluad Quad(high Dose 65+) 06/27/2021   Hepatitis A, Adult 06/26/1995, 08/19/1995   Influenza Split 07/18/2011   Influenza-Unspecified 08/06/2013, 06/25/2015   Meningococcal Polysaccharide 11/09/1995   OPV 10/19/1974   PFIZER(Purple Top)SARS-COV-2 Vaccination 09/15/2019, 10/09/2019, 06/21/2020, 07/13/2021   Pneumococcal Conjugate-13 04/07/2019   Pneumococcal Polysaccharide-23 05/04/2020   Tdap 09/18/2009, 01/31/2019   Yellow Fever 02/26/1995   Influenza vaccine- completed. Agrees to update immunization record next office visit.   Covid vaccines- completed. Agrees to update immunization record next office visit.   Shingrix Completed?: No.    Education has been provided regarding the importance of this vaccine. Patient has been advised to call insurance company to determine out of pocket expense if they have not yet received this vaccine. Advised may also receive vaccine at local pharmacy or Health Dept. Verbalized acceptance and understanding.  Screening Tests Health Maintenance  Topic Date Due   INFLUENZA VACCINE  07/25/2022 (Originally 04/18/2022)   COVID-19 Vaccine (5 - Pfizer series) 08/02/2022 (Originally 09/07/2021)   Zoster Vaccines- Shingrix (1 of 2) 10/17/2022 (Originally 09/24/2003)   COLONOSCOPY (Pts 45-6yr Insurance coverage will need to be confirmed)  08/30/2022   Medicare Annual Wellness (AWV)  07/18/2023   MAMMOGRAM  09/23/2023   TETANUS/TDAP  01/30/2029   Pneumonia Vaccine 66 Years old  Completed   DEXA SCAN  Completed   Hepatitis C Screening  Completed   HPV VACCINES  Aged Out    Health Maintenance There are no preventive care reminders to display for this patient.  Colonoscopy- due 08/2022. Deferred for further discussion  with PCP per patient preference. Appointment note edited to reflect.   Lung Cancer Screening: (Low Dose CT Chest recommended if Age 23-80 years, 30 pack-year currently smoking OR have quit w/in 15years.) does not qualify.   Hepatitis C Screening: Completed 2019.  Vision Screening: Recommended annual ophthalmology exams for early detection of glaucoma and other disorders of the eye.  Dental Screening: Recommended annual dental exams for proper oral hygiene.  Community Resource Referral / Chronic Care Management: CRR required this visit?  No   CCM required this visit?  No      Plan:     I have personally reviewed and noted the following in the patient's chart:   Medical and social history Use of alcohol, tobacco or illicit drugs  Current medications and supplements including opioid prescriptions. Patient is not currently taking opioid prescriptions. Functional ability and status Nutritional status Physical activity Advanced directives List of other physicians Hospitalizations, surgeries, and ER visits in previous 12 months Vitals Screenings to include cognitive, depression, and falls Referrals and appointments  In addition, I have reviewed and discussed with patient certain preventive protocols, quality metrics, and best practice recommendations. A written personalized care plan for preventive services as well as general preventive health recommendations were provided to patient.     Leta Jungling, LPN   [id number]

## 2022-07-18 NOTE — Telephone Encounter (Signed)
Neeral with [facility] is calling Alisha, RN back in regards to questions she had about patients insurance, etc.  Requesting call back.

## 2022-07-18 NOTE — Telephone Encounter (Signed)
Spoke with Neeral with [employer] and he reviewed that patient will not be charged for one monitor but that due to it being 2 requests insurance may need medical necessity for the additional monitor that was replacement. No way of knowing at this time but once it is billed they will determine if additional documentation is needed. No further needs at this time.

## 2022-07-25 ENCOUNTER — Telehealth: Payer: Self-pay | Admitting: Family

## 2022-07-25 ENCOUNTER — Encounter: Payer: Self-pay | Admitting: Family

## 2022-07-25 ENCOUNTER — Ambulatory Visit (INDEPENDENT_AMBULATORY_CARE_PROVIDER_SITE_OTHER): Payer: Medicare Other | Admitting: Family

## 2022-07-25 VITALS — BP 116/70 | HR 78 | Temp 97.9°F | Ht 64.0 in | Wt 134.6 lb

## 2022-07-25 DIAGNOSIS — Z1211 Encounter for screening for malignant neoplasm of colon: Secondary | ICD-10-CM

## 2022-07-25 DIAGNOSIS — R002 Palpitations: Secondary | ICD-10-CM

## 2022-07-25 DIAGNOSIS — Z780 Asymptomatic menopausal state: Secondary | ICD-10-CM

## 2022-07-25 DIAGNOSIS — I493 Ventricular premature depolarization: Secondary | ICD-10-CM | POA: Diagnosis not present

## 2022-07-25 DIAGNOSIS — R5383 Other fatigue: Secondary | ICD-10-CM

## 2022-07-25 DIAGNOSIS — E7800 Pure hypercholesterolemia, unspecified: Secondary | ICD-10-CM

## 2022-07-25 DIAGNOSIS — Z1231 Encounter for screening mammogram for malignant neoplasm of breast: Secondary | ICD-10-CM

## 2022-07-25 DIAGNOSIS — E559 Vitamin D deficiency, unspecified: Secondary | ICD-10-CM

## 2022-07-25 DIAGNOSIS — Z0000 Encounter for general adult medical examination without abnormal findings: Secondary | ICD-10-CM | POA: Diagnosis not present

## 2022-07-25 DIAGNOSIS — K861 Other chronic pancreatitis: Secondary | ICD-10-CM

## 2022-07-25 NOTE — Assessment & Plan Note (Signed)
Reviewed cardiology note, Zio results.  Patient continues to have occasional palpitations, last episode approximately 6 weeks ago while she was wearing Zio.   Offered making earlier appointment with Dr. Saunders Revel, she politely declines at this time.  Counseled her on alarm features in particular sustained palpitations, associated chest pain, syncope or dizziness.  She will certainly let me know

## 2022-07-25 NOTE — Telephone Encounter (Signed)
Call [facility] in [place]. Pap done 12/2020 ( around this time)   We need results

## 2022-07-25 NOTE — Patient Instructions (Addendum)
Please call [facility] to schedule bone density and 3D mammogram at [phone]. As discussed, please let me know how you are doing as a relates to palpitations  Referral for colonoscopy Let us know if you dont hear back within a week in regards to an appointment being scheduled.   So that you are aware, if you are [facility] MyChart user , please pay attention to your MyChart messages as you may receive a MyChart message with a phone number to call and schedule this test/appointment own your own from our referral coordinator. This is a new process so I do not want you to miss this message.  If you are not a MyChart user, you will receive a phone call.    Health Maintenance for Postmenopausal Women Menopause is a normal process in which your ability to get pregnant comes to an end. This process happens slowly over many months or years, usually between the ages of 51 and 78. Menopause is complete when you have missed your menstrual period for 12 months. It is important to talk with your health care provider about some of the most common conditions that affect women after menopause (postmenopausal women). These include heart disease, cancer, and bone loss (osteoporosis). Adopting a healthy lifestyle and getting preventive care can help to promote your health and wellness. The actions you take can also lower your chances of developing some of these common conditions. What are the signs and symptoms of menopause? During menopause, you may have the following symptoms: Hot flashes. These can be moderate or severe. Night sweats. Decrease in sex drive. Mood swings. Headaches. Tiredness (fatigue). Irritability. Memory problems. Problems falling asleep or staying asleep. Talk with your health care provider about treatment options for your symptoms. Do I need hormone replacement therapy? Hormone replacement therapy is effective in treating symptoms that are caused by menopause, such as hot flashes and  night sweats. Hormone replacement carries certain risks, especially as you become older. If you are thinking about using estrogen or estrogen with progestin, discuss the benefits and risks with your health care provider. How can I reduce my risk for heart disease and stroke? The risk of heart disease, heart attack, and stroke increases as you age. One of the causes may be a change in the body's hormones during menopause. This can affect how your body uses dietary fats, triglycerides, and cholesterol. Heart attack and stroke are medical emergencies. There are many things that you can do to help prevent heart disease and stroke. Watch your blood pressure High blood pressure causes heart disease and increases the risk of stroke. This is more likely to develop in people who have high blood pressure readings or are overweight. Have your blood pressure checked: Every 3-5 years if you are 71-44 years of age. Every year if you are 83 years old or older. Eat a healthy diet  Eat a diet that includes plenty of vegetables, fruits, low-fat dairy products, and lean protein. Do not eat a lot of foods that are high in solid fats, added sugars, or sodium. Get regular exercise Get regular exercise. This is one of the most important things you can do for your health. Most adults should: Try to exercise for at least 150 minutes each week. The exercise should increase your heart rate and make you sweat (moderate-intensity exercise). Try to do strengthening exercises at least twice each week. Do these in addition to the moderate-intensity exercise. Spend less time sitting. Even light physical activity can be beneficial. Other  tips Work with your health care provider to achieve or maintain a healthy weight. Do not use any products that contain nicotine or tobacco. These products include cigarettes, chewing tobacco, and vaping devices, such as e-cigarettes. If you need help quitting, ask your health care provider. Know  your numbers. Ask your health care provider to check your cholesterol and your blood sugar (glucose). Continue to have your blood tested as directed by your health care provider. Do I need screening for cancer? Depending on your health history and family history, you may need to have cancer screenings at different stages of your life. This may include screening for: Breast cancer. Cervical cancer. Lung cancer. Colorectal cancer. What is my risk for osteoporosis? After menopause, you may be at increased risk for osteoporosis. Osteoporosis is a condition in which bone destruction happens more quickly than new bone creation. To help prevent osteoporosis or the bone fractures that can happen because of osteoporosis, you may take the following actions: If you are 50-55 years old, get at least 1,000 mg of calcium and at least 600 international units (IU) of vitamin D per day. If you are older than age 38 but younger than age 78, get at least 1,200 mg of calcium and at least 600 international units (IU) of vitamin D per day. If you are older than age 53, get at least 1,200 mg of calcium and at least 800 international units (IU) of vitamin D per day. Smoking and drinking excessive alcohol increase the risk of osteoporosis. Eat foods that are rich in calcium and vitamin D, and do weight-bearing exercises several times each week as directed by your health care provider. How does menopause affect my mental health? Depression may occur at any age, but it is more common as you become older. Common symptoms of depression include: Feeling depressed. Changes in sleep patterns. Changes in appetite or eating patterns. Feeling an overall lack of motivation or enjoyment of activities that you previously enjoyed. Frequent crying spells. Talk with your health care provider if you think that you are experiencing any of these symptoms. General instructions See your health care provider for regular wellness exams and  vaccines. This may include: Scheduling regular health, dental, and eye exams. Getting and maintaining your vaccines. These include: Influenza vaccine. Get this vaccine each year before the flu season begins. Pneumonia vaccine. Shingles vaccine. Tetanus, diphtheria, and pertussis (Tdap) booster vaccine. Your health care provider may also recommend other immunizations. Tell your health care provider if you have ever been abused or do not feel safe at home. Summary Menopause is a normal process in which your ability to get pregnant comes to an end. This condition causes hot flashes, night sweats, decreased interest in sex, mood swings, headaches, or lack of sleep. Treatment for this condition may include hormone replacement therapy. Take actions to keep yourself healthy, including exercising regularly, eating a healthy diet, watching your weight, and checking your blood pressure and blood sugar levels. Get screened for cancer and depression. Make sure that you are up to date with all your vaccines. This information is not intended to replace advice given to you by your health care provider. Make sure you discuss any questions you have with your health care provider. Document Revised: 01/24/2021 Document Reviewed: 01/24/2021 Elsevier Patient Education  [place].

## 2022-07-25 NOTE — Assessment & Plan Note (Signed)
Clinical breast and performed today.  Patient prefers 3D mammogram and advised patient to go to [facility] in [facility] for both 3D mammogram and bone density.  She will call to schedule this.  Requesting Pap smear done in 2022 from [facility] in [place].  Congratulated patient on diligence to exercise

## 2022-07-25 NOTE — Progress Notes (Signed)
Subjective:    Patient ID: Kelly [name], female    DOB: 1956/08/26, 66 y.o.   MRN: [id number]  CC: Kelly [name] is a 66 y.o. female who presents today for physical exam.    HPI: She continue to have occassional palpitations. She is unsure of trigger. Last episode 06/17/22 and she reports she had Zio monitor on at that time.   One episode with caffeine related which reminds her of previous of PVCs.   No associated CP, syncope, dizziness.     Consult with cardiology  04/28/22 with follow up with scheduled with Dr End next month.  Follows with Dr Evorn Gong, dermatology.   Colorectal Cancer Screening: UTD , 08/30/17, repeat in 5 years.  Breast Cancer Screening: Mammogram UTD at [facility].  Cervical Cancer Screening: Pap obtained 01/23/2017.  Atrophy noted.  Negative malignancy HPV  She following with [facility] in [place]. Pap done 12/2020 negative HPV, malignancy.   Bone Health screening/DEXA for 65+: due  Lung Cancer Screening: Doesn't have 20 year pack year history and age > 74 years yo 30 years       Tetanus -         Pneumococcal - Complete Labs: Screening labs today. Exercise: Gets regular exercise with golf, trainer.   Alcohol use:  none Smoking/tobacco use: Nonsmoker.     HISTORY:  Past Medical History:  Diagnosis Date   Claustrophobia    Pancreatitis 2009, 2005   Shingles    Shingles outbreak 09-Dec-2004    Past Surgical History:  Procedure Laterality Date   BREAST BIOPSY     Nml per pt , f/u mammo 10/2011   CHOLECYSTECTOMY     COLONOSCOPY WITH PROPOFOL N/A 08/30/2017   Procedure: COLONOSCOPY WITH PROPOFOL;  Surgeon: Lollie Sails, MD;  Location: [facility] ENDOSCOPY;  Service: Endoscopy;  Laterality: N/A;   EXPLORATORY LAPAROTOMY     TRIGGER FINGER RELEASE     VAGINAL DELIVERY  09/18/1992   Family History  Problem Relation Age of Onset   Colon cancer Mother 35   Lymphoma Mother    Cancer Mother 28       lymphoma and colon   Heart disease Father 78    Multiple sclerosis Sister    Depression Sister    Diabetes Sister    Breast cancer Maternal Grandmother    Coronary artery disease Other    Heart attack Other        12/10/2022    Sudden Cardiac Death Other       ALLERGIES: Estradiol, Pantoprazole sodium, Pollen extract, and Prednisolone  Current Outpatient Medications on File Prior to Visit  Medication Sig Dispense Refill   acetaminophen (TYLENOL) 650 MG CR tablet Take 650 mg by mouth as needed.     cholecalciferol (VITAMIN D) 1000 UNITS tablet Take 1,000 Units by mouth daily.     No current facility-administered medications on file prior to visit.    Social History   Tobacco Use   Smoking status: Never   Smokeless tobacco: Never  Vaping Use   Vaping Use: Never used  Substance Use Topics   Alcohol use: Yes    Comment: 6 glasses of wine a month   Drug use: No    Review of Systems  Constitutional:  Negative for chills, fever and unexpected weight change.  HENT:  Negative for congestion.   Respiratory:  Negative for cough.   Cardiovascular:  Positive for palpitations. Negative for chest pain and leg swelling.  Gastrointestinal:  Negative  for nausea and vomiting.  Musculoskeletal:  Negative for arthralgias and myalgias.  Skin:  Negative for rash.  Neurological:  Negative for dizziness, syncope and headaches.  Hematological:  Negative for adenopathy.  Psychiatric/Behavioral:  Negative for confusion.       Objective:    BP 116/70 (BP Location: Left Arm, Patient Position: Sitting, Cuff Size: Normal)   Pulse 78   Temp 97.9 F (36.6 C) (Oral)   Ht '5\' 4"'$  (1.626 m)   Wt 134 lb 9.6 oz (61.1 kg)   SpO2 96%   BMI 23.10 kg/m   BP Readings from Last 3 Encounters:  07/25/22 116/70  07/05/22 112/72  04/28/22 108/66   Wt Readings from Last 3 Encounters:  07/25/22 134 lb 9.6 oz (61.1 kg)  07/17/22 136 lb (61.7 kg)  07/05/22 136 lb (61.7 kg)    Physical Exam Vitals reviewed.  Constitutional:      Appearance: Normal  appearance. She is well-developed.  Eyes:     Conjunctiva/sclera: Conjunctivae normal.  Neck:     Thyroid: No thyroid mass or thyromegaly.  Cardiovascular:     Rate and Rhythm: Normal rate and regular rhythm.     Pulses: Normal pulses.     Heart sounds: Normal heart sounds.  Pulmonary:     Effort: Pulmonary effort is normal.     Breath sounds: Normal breath sounds. No wheezing, rhonchi or rales.  Chest:  Breasts:    Breasts are symmetrical.     Right: No inverted nipple, mass, nipple discharge, skin change or tenderness.     Left: No inverted nipple, mass, nipple discharge, skin change or tenderness.  Abdominal:     General: Bowel sounds are normal. There is no distension.     Palpations: Abdomen is soft. Abdomen is not rigid. There is no fluid wave or mass.     Tenderness: There is no abdominal tenderness. There is no guarding or rebound.  Lymphadenopathy:     Head:     Right side of head: No submental, submandibular, tonsillar, preauricular, posterior auricular or occipital adenopathy.     Left side of head: No submental, submandibular, tonsillar, preauricular, posterior auricular or occipital adenopathy.     Cervical: No cervical adenopathy.     Right cervical: No superficial, deep or posterior cervical adenopathy.    Left cervical: No superficial, deep or posterior cervical adenopathy.  Skin:    General: Skin is warm and dry.  Neurological:     Mental Status: She is alert.  Psychiatric:        Speech: Speech normal.        Behavior: Behavior normal.        Thought Content: Thought content normal.        Assessment & Plan:   Problem List Items Addressed This Visit       Cardiovascular and Mediastinum   PVC's (premature ventricular contractions)     Digestive   Chronic pancreatitis ([place])     Other   Palpitations    Reviewed cardiology note, Zio results.  Patient continues to have occasional palpitations, last episode approximately 6 weeks ago while she was  wearing Zio.   Offered making earlier appointment with Dr. Saunders Revel, she politely declines at this time.  Counseled her on alarm features in particular sustained palpitations, associated chest pain, syncope or dizziness.  She will certainly let me know      Relevant Orders   CBC with Differential/Platelet   Comprehensive metabolic panel   TSH  Pure hypercholesterolemia   Relevant Orders   Comprehensive metabolic panel   Lipid panel   Routine general medical examination at a health care facility - Primary    Clinical breast and performed today.  Patient prefers 3D mammogram and advised patient to go to [facility] in [facility] for both 3D mammogram and bone density.  She will call to schedule this.  Requesting Pap smear done in 2022 from [facility] in [place].  Congratulated patient on diligence to exercise      Other Visit Diagnoses     Screen for colon cancer       Relevant Orders   Ambulatory referral to Gastroenterology   Vitamin D deficiency       Relevant Orders   VITAMIN D 25 Hydroxy (Vit-D Deficiency, Fractures)   Encounter for screening mammogram for malignant neoplasm of breast       Relevant Orders   MM 3D SCREEN BREAST BILATERAL   Postmenopausal estrogen deficiency       Relevant Orders   DG Bone Density   Other fatigue            I have discontinued Lynnel L. Garron's gabapentin. I am also having her maintain her cholecalciferol and acetaminophen.   No orders of the defined types were placed in this encounter.   Return precautions given.   Risks, benefits, and alternatives of the medications and treatment plan prescribed today were discussed, and patient expressed understanding.   Education regarding symptom management and diagnosis given to patient on AVS.   Continue to follow with Burnard Hawthorne, FNP for routine health maintenance.   Kelly [name] and I agreed with plan.   Mable Paris, FNP

## 2022-07-26 NOTE — Telephone Encounter (Signed)
Spoke to Kelly [name] at [facility] and she stated that she would give message to Nurse to send copy of report  from pap done on or around 12/2020.Hanley Seamen her fax # [phone] and phone # [phone] just in case she needs  to contact us

## 2022-07-28 NOTE — Telephone Encounter (Signed)
Received fax on 07/28/22 From Dr Drema Dallas of [facility]

## 2022-08-15 ENCOUNTER — Other Ambulatory Visit (INDEPENDENT_AMBULATORY_CARE_PROVIDER_SITE_OTHER): Payer: Medicare Other

## 2022-08-15 DIAGNOSIS — R002 Palpitations: Secondary | ICD-10-CM

## 2022-08-15 DIAGNOSIS — E559 Vitamin D deficiency, unspecified: Secondary | ICD-10-CM

## 2022-08-15 DIAGNOSIS — E7800 Pure hypercholesterolemia, unspecified: Secondary | ICD-10-CM

## 2022-08-15 LAB — LIPID PANEL
Cholesterol: 280 mg/dL — ABNORMAL HIGH (ref 0–200)
HDL: 86.9 mg/dL (ref >39.00)
LDL Cholesterol: 181 mg/dL — ABNORMAL HIGH (ref 0–99)
NonHDL: 192.71
Total CHOL/HDL Ratio: 3
Triglycerides: 61 mg/dL (ref 0.0–149.0)
VLDL: 12.2 mg/dL (ref 0.0–40.0)

## 2022-08-15 LAB — CBC WITH DIFFERENTIAL/PLATELET
Basophils Absolute: 0 10*3/uL (ref 0.0–0.1)
Basophils Relative: 0.6 % (ref 0.0–3.0)
Eosinophils Absolute: 0.1 10*3/uL (ref 0.0–0.7)
Eosinophils Relative: 1.8 % (ref 0.0–5.0)
HCT: 40.5 % (ref 36.0–46.0)
Hemoglobin: 13.6 g/dL (ref 12.0–15.0)
Lymphocytes Relative: 35.1 % (ref 12.0–46.0)
Lymphs Abs: 1.7 10*3/uL (ref 0.7–4.0)
MCHC: 33.6 g/dL (ref 30.0–36.0)
MCV: 98.6 fl (ref 78.0–100.0)
Monocytes Absolute: 0.4 10*3/uL (ref 0.1–1.0)
Monocytes Relative: 7.3 % (ref 3.0–12.0)
Neutro Abs: 2.6 10*3/uL (ref 1.4–7.7)
Neutrophils Relative %: 55.2 % (ref 43.0–77.0)
Platelets: 211 10*3/uL (ref 150.0–400.0)
RBC: 4.11 Mil/uL (ref 3.87–5.11)
RDW: 13.3 % (ref 11.5–15.5)
WBC: 4.8 10*3/uL (ref 4.0–10.5)

## 2022-08-15 LAB — VITAMIN D 25 HYDROXY (VIT D DEFICIENCY, FRACTURES): VITD: 29.76 ng/mL — ABNORMAL LOW (ref 30.00–100.00)

## 2022-08-15 LAB — COMPREHENSIVE METABOLIC PANEL
ALT: 20 U/L (ref 0–35)
AST: 20 U/L (ref 0–37)
Albumin: 4.4 g/dL (ref 3.5–5.2)
Alkaline Phosphatase: 74 U/L (ref 39–117)
BUN: 23 mg/dL (ref 6–23)
CO2: 28 mEq/L (ref 19–32)
Calcium: 9.1 mg/dL (ref 8.4–10.5)
Chloride: 104 mEq/L (ref 96–112)
Creatinine, Ser: 0.56 mg/dL (ref 0.40–1.20)
GFR: 93.46 mL/min (ref >60.00)
Glucose, Bld: 92 mg/dL (ref 70–99)
Potassium: 3.9 mEq/L (ref 3.5–5.1)
Sodium: 140 mEq/L (ref 135–145)
Total Bilirubin: 1 mg/dL (ref 0.2–1.2)
Total Protein: 6.9 g/dL (ref 6.0–8.3)

## 2022-08-15 LAB — TSH: TSH: 2.59 u[IU]/mL (ref 0.35–5.50)

## 2022-08-31 ENCOUNTER — Encounter: Payer: Self-pay | Admitting: *Deleted

## 2022-08-31 ENCOUNTER — Ambulatory Visit: Payer: Medicare Other | Attending: Internal Medicine | Admitting: Internal Medicine

## 2022-08-31 ENCOUNTER — Encounter: Payer: Self-pay | Admitting: Internal Medicine

## 2022-08-31 VITALS — BP 124/70 | HR 71 | Ht 63.0 in | Wt 137.0 lb

## 2022-08-31 DIAGNOSIS — Z79899 Other long term (current) drug therapy: Secondary | ICD-10-CM | POA: Diagnosis not present

## 2022-08-31 DIAGNOSIS — I493 Ventricular premature depolarization: Secondary | ICD-10-CM

## 2022-08-31 DIAGNOSIS — I700 Atherosclerosis of aorta: Secondary | ICD-10-CM

## 2022-08-31 DIAGNOSIS — E7800 Pure hypercholesterolemia, unspecified: Secondary | ICD-10-CM | POA: Diagnosis not present

## 2022-08-31 DIAGNOSIS — R002 Palpitations: Secondary | ICD-10-CM | POA: Diagnosis present

## 2022-08-31 MED ORDER — ROSUVASTATIN CALCIUM 5 MG PO TABS: 5.0000 mg | ORAL_TABLET | Freq: Every day | ORAL | 3 refills | Status: DC

## 2022-08-31 NOTE — Progress Notes (Signed)
Follow-up Outpatient Visit Date: 08/31/2022  Primary Care Provider: Burnard Hawthorne, FNP [address] [place] [address]  Chief Complaint: Palpitations  HPI:  Kelly [name] is a 66 y.o. female with history of chronic small pericardial effusion, PVCs, pancreatitis, shingles, and claustrophobia, who presents for follow-up of PVCs.  She was last seen in our office in August by Gerrie Nordmann, NP, at which time she was concerned about 4 episodes of palpitations lasting up to 5 hours.  There were no associated symptoms.  Repeat event monitor was placed, showing predominantly sinus rhythm with rare PACs and PVCs as well as 3 supraventricular runs lasting 11 beats.  Kelly [name] notes that she had a hard time keeping the monitor affixed to her skin.  She ultimately wound up wearing two monitors but was only able to get about 1 weeks worth of recordings. Today, Kelly [name] continues to note occasional palpitations that are different than what she has experienced in the past with her PVCs.  She now sporadically feels like her heart is beating faster and that her whole body shakes.  It has happened a total of 5 times over the last 6 to 9 months, usually lasting 1 to 4 hours.  She otherwise has been feeling well, denying chest pain, shortness of breath, lightheadedness, and edema.  --------------------------------------------------------------------------------------------------  Cardiovascular History & Procedures: Cardiovascular Problems: Palpitations/PVC's Pericardial effusion   Risk Factors: Age greater than 16   Cath/PCI: None   CV Surgery: None   EP Procedures and Devices: 14-day event monitor (04/28/2022): Predominantly sinus rhythm with rare PACs and PVCs as well as a few brief episodes of PSVT.  No sustained arrhythmia observed. 3-day event monitor (08/27/2019): Predominantly sinus rhythm with rare PACs and frequent PVCs (PVC burden 14.5%).  Single episode of brief  PSVT also noted. 24-hour Holter monitor (11/13/2016): Rare PACs and PVCs.  Single 3 beat atrial run.  No sustained arrhythmia or prolonged pause.   Non-Invasive Evaluation(s): Pharmacologic MPI (10/31/2019): Low risk study without ischemia or scar.  No coronary artery calcification.  LVEF 69%.  PVCs noted during study. TTE (08/21/2019): Normal LV size and wall thickness.  LVEF 60-65% with grade 2 diastolic dysfunction.  Normal RV size and function.  Mild left atrial enlargement.  Mild to moderate tricuspid regurgitation.  Upper normal PA pressure.  Mildly elevated CVP.  Small pericardial effusion. Limited TTE (04/03/2017): Upper normal LV size with normal wall thickness.  LVEF 60-65% with normal wall motion.  Small pericardial effusion, stable to minimally increased in size from 10/2016. TTE (11/15/2016): Normal LV size with upper normal wall thickness.  LVEF 55-60% with normal wall motion and grade 1 diastolic dysfunction.  Mild aortic and tricuspid regurgitation.  Normal RV size and function dilated IVC.  Trivial pericardial effusion.  Recent CV Pertinent Labs: Lab Results  Component Value Date   CHOL 280 (H) 08/15/2022   CHOL 272 (H) 08/19/2021   HDL 86.90 08/15/2022   HDL 86 08/19/2021   LDLCALC 181 (H) 08/15/2022   LDLCALC 171 (H) 08/19/2021   LDLDIRECT 151.3 10/19/2011   TRIG 61.0 08/15/2022   CHOLHDL 3 08/15/2022   K 3.9 08/15/2022   K 3.7 09/20/2014   MG 2.1 07/17/2019   BUN 23 08/15/2022   BUN 21 08/19/2021   BUN 15 09/20/2014   CREATININE 0.56 08/15/2022   CREATININE 0.64 09/20/2014    Past medical and surgical history were reviewed and updated in EPIC.  Current Meds  Medication Sig  acetaminophen (TYLENOL) 650 MG CR tablet Take 650 mg by mouth as needed.   cholecalciferol (VITAMIN D) 1000 UNITS tablet Take 1,000 Units by mouth daily.    Allergies: Estradiol, Pantoprazole sodium, Pollen extract, and Prednisolone  Social History   Tobacco Use   Smoking status: Never    Smokeless tobacco: Never  Vaping Use   Vaping Use: Never used  Substance Use Topics   Alcohol use: Yes    Comment: 6 glasses of wine a month   Drug use: No    Family History  Problem Relation Age of Onset   Colon cancer Mother 73   Lymphoma Mother    Cancer Mother 25       lymphoma and colon   Heart disease Father 82   Multiple sclerosis Sister    Depression Sister    Diabetes Sister    Breast cancer Maternal Grandmother    Coronary artery disease Other    Heart attack Other        12-15-2022    Sudden Cardiac Death Other     Review of Systems: A 12-system review of systems was performed and was negative except as noted in the HPI.  --------------------------------------------------------------------------------------------------  Physical Exam: BP 124/70 (BP Location: Left Arm, Patient Position: Sitting, Cuff Size: Normal)   Pulse 71   Ht '5\' 3"'$  (1.6 m)   Wt 137 lb (62.1 kg)   SpO2 98%   BMI 24.27 kg/m   General:  NAD. Neck: No JVD or HJR. Lungs: Clear to auscultation bilaterally without wheezes or crackles. Heart: Regular rate and rhythm without murmurs, rubs, or gallops. Abdomen: Soft, nontender, nondistended. Extremities: No lower extremity edema.  EKG: Normal sinus rhythm with borderline left atrial enlargement.  No significant change from prior tracing on 04/28/2022.  Lab Results  Component Value Date   WBC 4.8 08/15/2022   HGB 13.6 08/15/2022   HCT 40.5 08/15/2022   MCV 98.6 08/15/2022   PLT 211.0 08/15/2022    Lab Results  Component Value Date   NA 140 08/15/2022   K 3.9 08/15/2022   CL 104 08/15/2022   CO2 28 08/15/2022   BUN 23 08/15/2022   CREATININE 0.56 08/15/2022   GLUCOSE 92 08/15/2022   ALT 20 08/15/2022    Lab Results  Component Value Date   CHOL 280 (H) 08/15/2022   HDL 86.90 08/15/2022   LDLCALC 181 (H) 08/15/2022   LDLDIRECT 151.3 10/19/2011   TRIG 61.0 08/15/2022   CHOLHDL 3 08/15/2022     --------------------------------------------------------------------------------------------------  ASSESSMENT AND PLAN: Palpitations: Palpitations continue to occur intermittently and are different than what Kelly [name] experienced in the past with her PVCs.  Recent event monitors were difficult to keep affixed to her skin, though available tracings showed some brief episodes of PSVT as well as rare PACs and PVCs.  We discussed empiric trial of a beta-blocker, but Kelly [name] wishes to defer this for now.  If she has further symptoms, it may be worthwhile to consider a [employer] monitor or even referral to EP to discuss risks and benefits of ILR placement.  Aortic atherosclerosis and hyperlipidemia: Kelly [name] has been history of significant hyperlipidemia with recent lipid panel notable for total cholesterol of 280 and LDL of 181.  Pharmacologic MPI in 12-15-2019 showed no significant ischemia or coronary artery calcification, though mild aortic atherosclerosis near the aortic arch was observed.  We have discussed the risks and benefits of lipid therapy and have agreed to add rosuvastatin 5 mg daily.  We will plan to check a lipid panel and ALT in 1 month, given Kelly [name]'s concern about potential hepatotoxicity associated with statins.  Follow-up: Return to clinic in 6 months.  Nelva Bush, MD 08/31/2022 3:14 PM

## 2022-08-31 NOTE — Patient Instructions (Signed)
Medication Instructions:  Your physician recommends the following medication changes.  START TAKING: Rosuvastatin 5 mg by mouth daily   *If you need a refill on your cardiac medications before your next appointment, please call your pharmacy*   Lab Work: Your provider would like for you to return in 1 month to have the following labs drawn: (Lipid, ALT).   Please go to the [facility] entrance and check in at the front desk.  You do not need an appointment.  They are open from 7am-6 pm.  You will need to be fasting.   If you have labs (blood work) drawn today and your tests are completely normal, you will receive your results only by: [place] (if you have MyChart) OR A paper copy in the mail If you have any lab test that is abnormal or we need to change your treatment, we will call you to review the results.   Testing/Procedures: None ordered today   Follow-Up: At [facility], you and your health needs are our priority.  As part of our continuing mission to provide you with exceptional heart care, we have created designated Provider Care Teams.  These Care Teams include your primary Cardiologist (physician) and Advanced Practice Providers (APPs -  Physician Assistants and Nurse Practitioners) who all work together to provide you with the care you need, when you need it.  We recommend signing up for the patient portal called "MyChart".  Sign up information is provided on this After Visit Summary.  MyChart is used to connect with patients for Virtual Visits (Telemedicine).  Patients are able to view lab/test results, encounter notes, upcoming appointments, etc.  Non-urgent messages can be sent to your provider as well.   To learn more about what you can do with MyChart, go to [email or web address].    Your next appointment:   6 month(s)  The format for your next appointment:   In Person  Provider:   You may see Nelva Bush, MD or one of the  following Advanced Practice Providers on your designated Care Team:   Murray Hodgkins, NP Christell Faith, PA-C Cadence Kathlen Mody, PA-C Gerrie Nordmann, NP

## 2022-09-01 ENCOUNTER — Encounter: Payer: Self-pay | Admitting: Internal Medicine

## 2022-09-01 DIAGNOSIS — I700 Atherosclerosis of aorta: Secondary | ICD-10-CM | POA: Insufficient documentation

## 2022-09-26 DIAGNOSIS — R928 Other abnormal and inconclusive findings on diagnostic imaging of breast: Secondary | ICD-10-CM

## 2022-10-02 NOTE — Progress Notes (Signed)
[place] Phone: [phone] Subjective:   Kelly [name], am serving as a scribe for Dr. Hulan [name].  I'm seeing this patient by the request  of:  Kelly Hawthorne, FNP  CC: right hand   WLN:[id number]  07/05/2022 Discussed with patient again.  Ultrasound does show potential beneficial decrease in size of the median nerve.  Continue conservative therapy.  Patient declined any type of injection wanting to avoid steroids secondary to the potential association with the pancreatitis.  Discussed with patient about PRP or surgical intervention.  Can follow-up with me in 2 to      Update 10/05/2022 Kelly [name] is a 67 y.o. female coming in with complaint of R carpal tunnel syndrome. Patient states that during the day she does not numbness or pain. Wears brace at night but if she does not she will wake up with numbness in hand.   L hand R finger and middle finger, R hand trigger finger. Triggering occurs with holding dog lease. Does not want steroid injections.       Past Medical History:  Diagnosis Date   Claustrophobia    Pancreatitis 2009, 2005   Shingles    Shingles outbreak 2006   Past Surgical History:  Procedure Laterality Date   BREAST BIOPSY     Nml per pt , f/u mammo 10/2011   CHOLECYSTECTOMY     COLONOSCOPY WITH PROPOFOL N/A 08/30/2017   Procedure: COLONOSCOPY WITH PROPOFOL;  Surgeon: Lollie Sails, MD;  Location: [facility] ENDOSCOPY;  Service: Endoscopy;  Laterality: N/A;   EXPLORATORY LAPAROTOMY     TRIGGER FINGER RELEASE     VAGINAL DELIVERY  09/18/1992   Social History   Socioeconomic History   Marital status: Married    Spouse name: Not on file   Number of children: 1    Years of education: Not on file   Highest education level: Not on file  Occupational History   Occupation: Nurse Midwife - [employer]   Tobacco Use   Smoking status: Never   Smokeless tobacco: Never   Vaping Use   Vaping Use: Never used  Substance and Sexual Activity   Alcohol use: Yes    Comment: 6 glasses of wine a month   Drug use: [name]   Sexual activity: Not on file  Other Topics Concern   Not on file  Social History Engineer, mining until 1997      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 from [employer] Strain: [place]  (07/17/2022)   Overall Financial Resource Strain (CARDIA)    Difficulty of Paying Living Expenses: Not hard at all  Food Insecurity: [name] Food Insecurity (07/17/2022)   Hunger Vital Sign    Worried About Running Out of Food in the Last Year: Never true    [place] in the Last Year: Never true  Transportation Needs: [name] Transportation Needs (07/17/2022)   PRAPARE - Hydrologist (Medical): [name]    Lack of Transportation (Non-Medical): [name]  Physical Activity: Sufficiently Active (07/17/2022)   Exercise Vital Sign    Days of Exercise per Week: 5 days    Minutes of Exercise per Session: 60 min  Stress: [name] Stress Concern Present (07/17/2022)   [place]    Feeling of Stress : Not at all  Social Connections:  Socially Integrated (07/17/2022)   Social Connection and Isolation Panel [NHANES]    Frequency of Communication with Friends and Family: More than three times a week    Frequency of Social Gatherings with Friends and Family: More than three times a week    Attends Religious Services: More than 4 times per year    Active Member of [employer] or Organizations: Yes    Attends Music therapist: More than 4 times per year    Marital Status: Married   Allergies  Allergen Reactions   Estradiol     Other reaction(s): pancreatitis   Pantoprazole Sodium Diarrhea   Pollen Extract    Prednisolone Other (See Comments)    pancreatitis   Family History  Problem Relation Age of Onset   Colon cancer Mother 16   Lymphoma  Mother    Cancer Mother 56       lymphoma and colon   Heart disease Father 80   Multiple sclerosis Sister    Depression Sister    Diabetes Sister    Breast cancer Maternal Grandmother    Coronary artery disease Other    Heart attack Other        Dec 05, 2022    Sudden Cardiac Death Other      Current Outpatient Medications (Cardiovascular):    rosuvastatin (CRESTOR) 5 MG tablet, Take 1 tablet (5 mg total) by mouth daily.   Current Outpatient Medications (Analgesics):    acetaminophen (TYLENOL) 650 MG CR tablet, Take 650 mg by mouth as needed.   Current Outpatient Medications (Other):    cholecalciferol (VITAMIN D) 1000 UNITS tablet, Take 1,000 Units by mouth daily.    Objective  Blood pressure 98/64, pulse 72, height '5\' 3"'$  (1.6 m), weight 135 lb (61.2 kg), SpO2 98 %.   General: [name] apparent distress alert and oriented x3 mood and affect normal, dressed appropriately.  HEENT: Pupils equal, extraocular movements intact  Respiratory: Patient's speak in full sentences and does not appear short of breath  Cardiovascular: [name] lower extremity edema, non tender, [name] erythema  Deferred physical exam    Impression and Recommendations:     The above documentation has been reviewed and is accurate and complete Kelly Pulley, DO

## 2022-10-05 ENCOUNTER — Ambulatory Visit: Payer: Self-pay

## 2022-10-05 ENCOUNTER — Ambulatory Visit (INDEPENDENT_AMBULATORY_CARE_PROVIDER_SITE_OTHER): Payer: Medicare Other | Admitting: Family Medicine

## 2022-10-05 VITALS — BP 98/64 | HR 72 | Ht 63.0 in | Wt 135.0 lb

## 2022-10-05 DIAGNOSIS — M25531 Pain in right wrist: Secondary | ICD-10-CM

## 2022-10-05 DIAGNOSIS — M6530 Trigger finger, unspecified finger: Secondary | ICD-10-CM

## 2022-10-05 DIAGNOSIS — G5601 Carpal tunnel syndrome, right upper limb: Secondary | ICD-10-CM | POA: Diagnosis not present

## 2022-10-05 NOTE — Assessment & Plan Note (Signed)
Patient would also like to try PRP in the nerve sheath.  Warned with her to that we have done this only on a couple patients and likely we have had improvement but once again evidence is not robust at this time.  Patient would like to try this before she would consider the possibility of surgical intervention.  Patient is an avid golfer and would like to have this done before the season.  Patient will follow-up at her earliest convenience

## 2022-10-05 NOTE — Assessment & Plan Note (Signed)
Patient does have acquired trigger finger.  We have discussed that this is of the right middle finger the A2 pulley.  Patient is unable to have steroids secondary to a history of pancreatitis.  Patient would like to try the PRP and warned that there is not significant data on using it here but it is safe.  Patient will schedule at her earliest convenience.

## 2022-10-05 NOTE — Patient Instructions (Signed)
Schedule for PRP up front

## 2022-10-09 NOTE — Telephone Encounter (Signed)
Call patient Kelly [name] Patient had asymmetry in right breast, distortion left breast.  Recommend bilateral diagnostic [name] with bilateral ultrasound.  I ordered these to be done at [facility].  Please fax to them.  Please asked patient to call us if she has any trouble calling them to schedule.

## 2022-10-10 ENCOUNTER — Encounter: Payer: Self-pay | Admitting: Internal Medicine

## 2022-10-10 NOTE — Telephone Encounter (Signed)
Spoke to pt and she was aware of the Abnormal Mammogram and she already has appt scheduled and orders have been faxed to [facility] as well

## 2022-10-12 ENCOUNTER — Encounter: Payer: Self-pay | Admitting: Family

## 2022-10-17 NOTE — Progress Notes (Unsigned)
[place] Phone: [phone] Subjective:   Fontaine No, am serving as a scribe for Dr. Hulan Saas.  I'm seeing this patient by the request  of:  Burnard Hawthorne, FNP  CC: Wrist pain follow-up  [id number]  10/05/2022 Patient would also like to try PRP in the nerve sheath. Warned with her to that we have done this only on a couple patients and likely we have had improvement but once again evidence is not robust at this time. Patient would like to try this before she would consider the possibility of surgical intervention. Patient is an avid golfer and would like to have this done before the season. Patient will follow-up at her earliest convenience   Patient does have acquired trigger finger. We have discussed that this is of the right middle finger the A2 pulley. Patient is unable to have steroids secondary to a history of pancreatitis. Patient would like to try the PRP and warned that there is not significant data on using it here but it is safe. Patient will schedule at her earliest convenience.   Updated 10/19/2022 CANDRA WEGNER is a 67 y.o. female coming in with complaint of R wrist and R middle and L ring finger pain. Here for PRP in these areas. Pain is the same as last visit. Triggering is intermittent. Pain in wrist is slightly improving.        Past Medical History:  Diagnosis Date   Claustrophobia    Pancreatitis 2009, 2005   Shingles    Shingles outbreak 2006   Past Surgical History:  Procedure Laterality Date   BREAST BIOPSY     Nml per pt , f/u mammo 10/2011   CHOLECYSTECTOMY     COLONOSCOPY WITH PROPOFOL N/A 08/30/2017   Procedure: COLONOSCOPY WITH PROPOFOL;  Surgeon: Lollie Sails, MD;  Location: [facility] ENDOSCOPY;  Service: Endoscopy;  Laterality: N/A;   EXPLORATORY LAPAROTOMY     TRIGGER FINGER RELEASE     VAGINAL DELIVERY  09/18/1992     Objective  Blood pressure  114/76, pulse 68, height '5\' 3"'$  (1.6 m), weight 148 lb (67.1 kg), SpO2 98 %.   General: No apparent distress alert and oriented x3 mood and affect normal, dressed appropriately.  HEENT: Pupils equal, extraocular movements intact  Respiratory: Patient's speak in full sentences and does not appear short of breath  Cardiovascular: No lower extremity edema, non tender, no erythema   Procedure: Real-time Ultrasound Guided Injection of right median nerve sheath Device: GE Logiq Q7 Ultrasound guided injection is preferred based studies that show increased duration, increased effect, greater accuracy, decreased procedural pain, increased response rate, and decreased cost with ultrasound guided versus blind injection.  Verbal informed consent obtained.  Time-out conducted.  Noted no overlying erythema, induration, or other signs of local infection.  Skin prepped in a sterile fashion.  Local anesthesia: Topical Ethyl chloride.  With sterile technique and under real time ultrasound guidance: With a 25-gauge half inch needle injecting .2 cc of 0.5% Marcaine and then injected with PRP Completed without difficulty  Pain immediately resolved suggesting accurate placement of the medication.  Advised to call if fevers/chills, erythema, induration, drainage, or persistent bleeding.  Impression: Technically successful ultrasound guided injection.  Procedure: Real-time Ultrasound Guided Injection of right middle finger flexor tendon sheath Device: GE Logiq Q7 Ultrasound guided injection is preferred based studies that show increased duration, increased effect, greater accuracy, decreased procedural pain, increased response  rate, and decreased cost with ultrasound guided versus blind injection.  Verbal informed consent obtained.  Time-out conducted.  Noted no overlying erythema, induration, or other signs of local infection.  Skin prepped in a sterile fashion.  Local anesthesia: Topical Ethyl chloride.  With  sterile technique and under real time ultrasound guidance: With a 25-gauge half inch needle injected with 0.2 cc of 0.5% Marcaine and then injected with 1 cc of PRP Completed without difficulty  Pain immediately resolved suggesting accurate placement of the medication.  Advised to call if fevers/chills, erythema, induration, drainage, or persistent bleeding.  Impression: Technically successful ultrasound guided injection.  Procedure: Real-time Ultrasound Guided Injection of left ring flexor tendon sheath Device: GE Logiq Q7 Ultrasound guided injection is preferred based studies that show increased duration, increased effect, greater accuracy, decreased procedural pain, increased response rate, and decreased cost with ultrasound guided versus blind injection.  Verbal informed consent obtained.  Time-out conducted.  Noted no overlying erythema, induration, or other signs of local infection.  Skin prepped in a sterile fashion.  Local anesthesia: Topical Ethyl chloride.  With sterile technique and under real time ultrasound guidance: With a 25-gauge half inch needle injected with 0.5 cc of 0.5% Marcaine then injected with 1 cc of PRP Completed without difficulty  Pain immediately resolved suggesting accurate placement of the medication.  Advised to call if fevers/chills, erythema, induration, drainage, or persistent bleeding.  Impression: Technically successful ultrasound guided injection.   Impression and Recommendations:     The above documentation has been reviewed and is accurate and complete Lyndal Pulley, DO

## 2022-10-19 ENCOUNTER — Ambulatory Visit: Payer: Self-pay

## 2022-10-19 ENCOUNTER — Ambulatory Visit (INDEPENDENT_AMBULATORY_CARE_PROVIDER_SITE_OTHER): Payer: Self-pay | Admitting: Family Medicine

## 2022-10-19 ENCOUNTER — Encounter: Payer: Self-pay | Admitting: Family Medicine

## 2022-10-19 VITALS — BP 114/76 | HR 68 | Ht 63.0 in | Wt 148.0 lb

## 2022-10-19 DIAGNOSIS — G5601 Carpal tunnel syndrome, right upper limb: Secondary | ICD-10-CM

## 2022-10-19 DIAGNOSIS — M25531 Pain in right wrist: Secondary | ICD-10-CM

## 2022-10-19 DIAGNOSIS — M6530 Trigger finger, unspecified finger: Secondary | ICD-10-CM

## 2022-10-19 NOTE — Assessment & Plan Note (Signed)
Post PRP handout given.

## 2022-10-19 NOTE — Patient Instructions (Signed)
No ice or IBU for 3 days Heat and Tylenol are ok See me again in 6 weeks 

## 2022-10-24 ENCOUNTER — Telehealth: Payer: Self-pay | Admitting: Family

## 2022-10-24 NOTE — Telephone Encounter (Signed)
Patient returned office phone call from [place].

## 2022-10-25 NOTE — Telephone Encounter (Signed)
Confirmed already see previous note!

## 2022-11-01 ENCOUNTER — Telehealth: Payer: Self-pay

## 2022-11-01 NOTE — Telephone Encounter (Signed)
Sherri from [facility] called and stated that pt had a BENIGN pathology report

## 2022-11-01 NOTE — Telephone Encounter (Signed)
noted 

## 2022-11-06 NOTE — Telephone Encounter (Signed)
Ensure pt is aware  I can see results in epic

## 2022-11-06 NOTE — Telephone Encounter (Signed)
LVM to inform pt that Sherri from [facility] called and stated that pt had a BENIGN pathology report

## 2022-11-09 NOTE — Telephone Encounter (Signed)
PT is aware.

## 2022-11-29 NOTE — Progress Notes (Signed)
Kelly [name] [name] Phone: [phone] Subjective:   Kelly [name], am serving as a scribe for Dr. Hulan [name].  I'm seeing this patient by the request  of:  Kelly Hawthorne, FNP  CC: wrist and finger pain   [id number]  10/19/2022 PRP in wrist and finger.  Updated 11/30/2022 Kelly [name] is a 67 y.o. female coming in with complaint of wrist pain. PRP f/u for trigger finger. Patient states that for 2 weeks she was doing better. Pain and locking occurring on R hand, middle finger. L hand is [name] but is point tender over the trigger nodule.   R wrist pain is manageable with bracing at night. [name] pain during the day.       Past Medical History:  Diagnosis Date   Claustrophobia    Pancreatitis 2009, 2005   Shingles    Shingles outbreak 2006   Past Surgical History:  Procedure Laterality Date   BREAST BIOPSY     Nml per pt , f/u mammo 10/2011   CHOLECYSTECTOMY     COLONOSCOPY WITH PROPOFOL N/A 08/30/2017   Procedure: COLONOSCOPY WITH PROPOFOL;  Surgeon: Kelly Sails, MD;  [facility] ENDOSCOPY;  Service: Endoscopy;  Laterality: N/A;   EXPLORATORY LAPAROTOMY     TRIGGER FINGER RELEASE     VAGINAL DELIVERY  09/18/1992   Social History   Socioeconomic History   Marital status: Married    Spouse name: Not on file   Number of children: 1    Years of education: Not on file   Highest education level: Not on file  Occupational History   Occupation: Nurse Midwife - [employer]   Tobacco Use   Smoking status: Never   Smokeless tobacco: Never  Vaping Use   Vaping Use: Never used  Substance and Sexual Activity   Alcohol use: Yes    Comment: 6 glasses of wine a month   Drug use: [name]   Sexual activity: Not on file  Other Topics Concern   Not on file  Social History Engineer, mining until 1997      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 from [employer] Strain: [place]  (07/17/2022)   Overall Financial Resource Strain (CARDIA)    Difficulty of Paying Living Expenses: Not hard at all  Food Insecurity: [name] Food Insecurity (07/17/2022)   Hunger Vital Sign    Worried About Running Out of Food in the Last Year: Never true    [place] in the Last Year: Never true  Transportation Needs: [name] Transportation Needs (07/17/2022)   PRAPARE - Hydrologist (Medical): [name]    Lack of Transportation (Non-Medical): [name]  Physical Activity: Sufficiently Active (07/17/2022)   Exercise Vital Sign    Days of Exercise per Week: 5 days    Minutes of Exercise per Session: 60 min  Stress: [name] Stress Concern Present (07/17/2022)   [place]    Feeling of Stress : Not at all  Social Connections: [place] (07/17/2022)   Social Connection and Isolation Panel [NHANES]    Frequency of Communication with Friends and Family: More than three times a week    Frequency of Social Gatherings with Friends and Family: More than three times a week    Attends Religious Services: More than 4 times per  year    Active Member of Clubs or Organizations: Yes    Attends Music therapist: More than 4 times per year    Marital Status: Married   Allergies  Allergen Reactions   Estradiol     Other reaction(s): pancreatitis   Pantoprazole Sodium Diarrhea   Pollen Extract    Prednisolone Other (See Comments)    pancreatitis   Family History  Problem Relation Age of Onset   Colon cancer Mother 67   Lymphoma Mother    Cancer Mother 74       lymphoma and colon   Heart disease Father 92   Multiple sclerosis Sister    Depression Sister    Diabetes Sister    Breast cancer Maternal Grandmother    Coronary artery disease Other    Heart attack Other        January 02, 2023    Sudden Cardiac Death Other      Current Outpatient Medications (Cardiovascular):     rosuvastatin (CRESTOR) 5 MG tablet, Take 1 tablet (5 mg total) by mouth daily.   Current Outpatient Medications (Analgesics):    acetaminophen (TYLENOL) 650 MG CR tablet, Take 650 mg by mouth as needed.   Current Outpatient Medications (Other):    cholecalciferol (VITAMIN D) 1000 UNITS tablet, Take 1,000 Units by mouth daily.   Reviewed prior external information including notes and imaging from  primary care provider As well as notes that were available from care everywhere and other healthcare systems.  Past medical history, social, surgical and family history all reviewed in electronic medical record.  [name] pertanent information unless stated regarding to the chief complaint.   Review of Systems:  [name] headache, visual changes, nausea, vomiting, diarrhea, constipation, dizziness, abdominal pain, skin rash, fevers, chills, night sweats, weight loss, swollen lymph nodes, body aches, joint swelling, chest pain, shortness of breath, mood changes. POSITIVE muscle aches  Objective  Blood pressure 104/66, pulse 69, height 5\' 3"  (1.6 m), weight 137 lb (62.1 kg), SpO2 98 %.   General: [name] apparent distress alert and oriented x3 mood and affect normal, dressed appropriately.  HEENT: Pupils equal, extraocular movements intact  Respiratory: Patient's speak in full sentences and does not appear short of breath  Cardiovascular: [name] lower extremity edema, non tender, [name] erythema  Right wrist exam shows the patient does have a relatively good range of motion noted at the moment.  Hand exam still shows some tenderness to palpation mostly over the A2 [name] negative Tinel's. Right hand middle finger is the area of most concern.  Limited muscular skeletal ultrasound was performed and interpreted by Kelly [name], M  Limited ultrasound shows that patient may have a mild hypoechoic cystic formation noted, does seem to be within the tendon sheath itself. Impression: Continued hypoechoic changes within the  tendon sheath that seems to be somewhat consistent with possible cyst formation   Impression and Recommendations:    The above documentation has been reviewed and is accurate and complete Kelly Pulley, DO

## 2022-11-30 ENCOUNTER — Ambulatory Visit (INDEPENDENT_AMBULATORY_CARE_PROVIDER_SITE_OTHER): Payer: Medicare Other | Admitting: Family Medicine

## 2022-11-30 ENCOUNTER — Other Ambulatory Visit: Payer: Self-pay

## 2022-11-30 VITALS — BP 104/66 | HR 69 | Ht 63.0 in | Wt 137.0 lb

## 2022-11-30 DIAGNOSIS — M25531 Pain in right wrist: Secondary | ICD-10-CM

## 2022-11-30 DIAGNOSIS — M6530 Trigger finger, unspecified finger: Secondary | ICD-10-CM

## 2022-11-30 NOTE — Patient Instructions (Signed)
[place] to wear while playing Check back in 2 months to look at cyst

## 2022-12-01 ENCOUNTER — Encounter: Payer: Self-pay | Admitting: Family Medicine

## 2022-12-01 NOTE — Assessment & Plan Note (Signed)
Discussed with patient at great length..  RP did not seem to make significant improvement as we hoped.  There is a possible cyst formation noted in the area and may need to consider aspiration.  Patient would like to continue with conservative therapy at this time with it not stopping any daily activities and just more frustrating.  Patient just secondary to the pancreatitis from steroids previously so is still nervous about that.  Discussed at relatively long length to state that we will monitor for now and discuss again about intervention at follow-up.

## 2022-12-19 ENCOUNTER — Encounter: Payer: Self-pay | Admitting: Internal Medicine

## 2022-12-26 ENCOUNTER — Other Ambulatory Visit
Admission: RE | Admit: 2022-12-26 | Discharge: 2022-12-26 | Disposition: A | Discharge status: Home / Self Care | Payer: Medicare Other | Source: Ambulatory Visit | Attending: Internal Medicine | Admitting: Internal Medicine

## 2022-12-26 DIAGNOSIS — Z79899 Other long term (current) drug therapy: Secondary | ICD-10-CM

## 2022-12-26 LAB — ALT: ALT: 20 U/L (ref 0–44)

## 2022-12-26 LAB — LIPID PANEL
Cholesterol: 301 mg/dL — ABNORMAL HIGH (ref 0–200)
HDL: 99 mg/dL (ref >40)
LDL Cholesterol: 191 mg/dL — ABNORMAL HIGH (ref 0–99)
Total CHOL/HDL Ratio: 3 RATIO
Triglycerides: 53 mg/dL (ref <150)
VLDL: 11 mg/dL (ref 0–40)

## 2022-12-28 ENCOUNTER — Ambulatory Visit: Payer: Medicare Other

## 2022-12-28 DIAGNOSIS — K640 First degree hemorrhoids: Secondary | ICD-10-CM

## 2022-12-28 DIAGNOSIS — Z86010 Personal history of colonic polyps: Secondary | ICD-10-CM

## 2023-01-01 ENCOUNTER — Encounter: Payer: Self-pay | Admitting: Internal Medicine

## 2023-01-01 DIAGNOSIS — Z79899 Other long term (current) drug therapy: Secondary | ICD-10-CM

## 2023-01-02 NOTE — Telephone Encounter (Signed)
LFT and CK order placed as recommended by Dr. Okey Dupre.

## 2023-01-19 ENCOUNTER — Other Ambulatory Visit
Admission: RE | Admit: 2023-01-19 | Discharge: 2023-01-19 | Disposition: A | Discharge status: Home / Self Care | Payer: Medicare Other | Source: Ambulatory Visit | Attending: Internal Medicine | Admitting: Internal Medicine

## 2023-01-19 DIAGNOSIS — Z79899 Other long term (current) drug therapy: Secondary | ICD-10-CM | POA: Insufficient documentation

## 2023-01-19 LAB — HEPATIC FUNCTION PANEL
ALT: 19 U/L (ref 0–44)
AST: 23 U/L (ref 15–41)
Albumin: 4.3 g/dL (ref 3.5–5.0)
Alkaline Phosphatase: 67 U/L (ref 38–126)
Bilirubin, Direct: 0.1 mg/dL (ref 0.0–0.2)
Indirect Bilirubin: 1 mg/dL — ABNORMAL HIGH (ref 0.3–0.9)
Total Bilirubin: 1.1 mg/dL (ref 0.3–1.2)
Total Protein: 7.3 g/dL (ref 6.5–8.1)

## 2023-01-19 LAB — CK: Total CK: 160 U/L (ref 38–234)

## 2023-01-31 NOTE — Progress Notes (Unsigned)
[name] [address] [place] [address] Phone: [phone] Subjective:   [name], am serving as a scribe for Dr. Antoine Primas.  I'm seeing this patient by the request  of:  Allegra Grana, FNP  CC: Right Kelly [name] pain  [id number]  11/30/2022 Discussed with patient at great length.. RP did not seem to make significant improvement as we hoped. There is a possible cyst formation noted in the area and may need to consider aspiration. Patient would like to continue with conservative therapy at this time with it not stopping any daily activities and just more frustrating. Patient just secondary to the pancreatitis from steroids previously so is still nervous about that. Discussed at relatively long length to state that we will monitor for now and discuss again about intervention at follow-up.   Updated 02/01/2023 Kelly [name] is a 67 y.o. female coming in with complaint of R Kelly [name] pain. Kelly [name] is doing well. Managing carpal tunnel well. Trigger finger has good and bad days. [place] while golfing helps with management of trigger finger. Patient states that overall was doing 95% better with the Kelly [name] and would state that she is feeling 60% better with the trigger fingers.  Not having any triggering and has days where she does not think of it.     Past Medical History:  Diagnosis Date   Claustrophobia    Pancreatitis 2009, 2005   Shingles    Shingles outbreak 2006   Past Surgical History:  Procedure Laterality Date   BREAST BIOPSY     Nml per pt , f/u mammo 10/2011   CHOLECYSTECTOMY     COLONOSCOPY WITH PROPOFOL N/A 08/30/2017   Procedure: COLONOSCOPY WITH PROPOFOL;  Surgeon: Christena Deem, MD;  Location: [facility] ENDOSCOPY;  Service: Endoscopy;  Laterality: N/A;   EXPLORATORY LAPAROTOMY     TRIGGER FINGER RELEASE     VAGINAL DELIVERY  09/18/1992   Social History   Socioeconomic History   Marital status: Married    Spouse  name: Not on file   Number of children: 1    Years of education: Not on file   Highest education level: Not on file  Occupational History   Occupation: Nurse Midwife - [employer]   Tobacco Use   Smoking status: Never   Smokeless tobacco: Never  Vaping Use   Vaping Use: Never used  Substance and Sexual Activity   Alcohol use: Yes    Comment: 6 glasses of wine a month   Drug use: No   Sexual activity: Not on file  Other Topics Concern   Not on file  Social History Financial trader until 1997      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 from [employer]      Social Determinants of Health   Financial Resource Strain: Low Risk  (07/17/2022)   Overall Financial Resource Strain (CARDIA)    Difficulty of Paying Living Expenses: Not hard at all  Food Insecurity: No Food Insecurity (07/17/2022)   Hunger Vital Sign    Worried About Running Out of Food in the Last Year: Never true    Ran Out of Food in the Last Year: Never true  Transportation Needs: No Transportation Needs (07/17/2022)   PRAPARE - Administrator, Civil Service (Medical): No    Lack of Transportation (Non-Medical): No  Physical Activity: Sufficiently Active (07/17/2022)   Exercise Vital Sign    Days of Exercise per Week: 5 days    Minutes  of Exercise per Session: 60 min  Stress: No Stress Concern Present (07/17/2022)   [employer] of Occupational Health - Occupational Stress Questionnaire    Feeling of Stress : Not at all  Social Connections: Socially Integrated (07/17/2022)   Social Connection and Isolation Panel [NHANES]    Frequency of Communication with Friends and Family: More than three times a week    Frequency of Social Gatherings with Friends and Family: More than three times a week    Attends Religious Services: More than 4 times per year    Active Member of [employer] or Organizations: Yes    Attends Engineer, structural: More than 4 times per year    Marital Status: Married   Allergies  Allergen Reactions    Estradiol     Other reaction(s): pancreatitis   Pantoprazole Sodium Diarrhea   Pollen Extract    Prednisolone Other (See Comments)    pancreatitis   Family History  Problem Relation Age of Onset   Colon cancer Mother 4   Lymphoma Mother    Cancer Mother 57       lymphoma and colon   Heart disease Father 48   Multiple sclerosis Sister    Depression Sister    Diabetes Sister    Breast cancer Maternal Grandmother    Coronary artery disease Other    Heart attack Other        40's    Sudden Cardiac Death Other      Current Outpatient Medications (Cardiovascular):    rosuvastatin (CRESTOR) 5 MG tablet, Take 1 tablet (5 mg total) by mouth daily.   Current Outpatient Medications (Analgesics):    acetaminophen (TYLENOL) 650 MG CR tablet, Take 650 mg by mouth as needed.   Current Outpatient Medications (Other):    cholecalciferol (VITAMIN D) 1000 UNITS tablet, Take 1,000 Units by mouth daily.   Reviewed prior external information including notes and imaging from  primary care provider As well as notes that were available from care everywhere and other healthcare systems.  Past medical history, social, surgical and family history all reviewed in electronic medical record.  No pertanent information unless stated regarding to the chief complaint.   Review of Systems:  No headache, visual changes, nausea, vomiting, diarrhea, constipation, dizziness, abdominal pain, skin rash, fevers, chills, night sweats, weight loss, swollen lymph nodes, body aches, joint swelling, chest pain, shortness of breath, mood changes. POSITIVE muscle aches  Objective  Blood pressure 106/68, pulse 78, height 5\' 3"  (1.6 m), weight 137 lb (62.1 kg), SpO2 96 %.   General: No apparent distress alert and oriented x3 mood and affect normal, dressed appropriately.  HEENT: Pupils equal, extraocular movements intact  Respiratory: Patient's speak in full sentences and does not appear short of breath   Cardiovascular: No lower extremity edema, non tender, no erythema  Patient Hand exam shows the right carpal tunnel nontender, negative Tinel's noted today.  Good grip strength.  Right middle finger does not show any significant triggering noted.  Does have the mild nodule at the A2 pulley.   Limited muscular skeletal ultrasound was performed and interpreted by Antoine Primas, M  Limited ultrasound shows some very mild hypoechoic changes around the tendon sheath of the flexor tendon sheath of the middle finger at the A2 pulley   Impression and Recommendations:     The above documentation has been reviewed and is accurate and complete Judi Saa, DO

## 2023-02-01 ENCOUNTER — Encounter: Payer: Self-pay | Admitting: Family Medicine

## 2023-02-01 ENCOUNTER — Other Ambulatory Visit: Payer: Self-pay

## 2023-02-01 ENCOUNTER — Ambulatory Visit (INDEPENDENT_AMBULATORY_CARE_PROVIDER_SITE_OTHER): Payer: Medicare Other | Admitting: Family Medicine

## 2023-02-01 VITALS — BP 106/68 | HR 78 | Ht 63.0 in | Wt 137.0 lb

## 2023-02-01 DIAGNOSIS — M6530 Trigger finger, unspecified finger: Secondary | ICD-10-CM

## 2023-02-01 DIAGNOSIS — M25531 Pain in right wrist: Secondary | ICD-10-CM | POA: Diagnosis not present

## 2023-02-01 NOTE — Assessment & Plan Note (Addendum)
Discussed with patient about icing regimen and home exercises, which activities to do and which ones to avoid.  Follow-up again as needed

## 2023-02-02 ENCOUNTER — Encounter: Payer: Self-pay | Admitting: Internal Medicine

## 2023-02-02 ENCOUNTER — Other Ambulatory Visit: Payer: Self-pay | Admitting: Internal Medicine

## 2023-02-02 DIAGNOSIS — L03312 Cellulitis of back [any part except buttock]: Secondary | ICD-10-CM | POA: Insufficient documentation

## 2023-02-02 MED ORDER — DOXYCYCLINE HYCLATE 100 MG PO TABS
100.0000 mg | ORAL_TABLET | Freq: Two times a day (BID) | ORAL | 0 refills | Status: DC
Start: 2023-02-02 — End: 2023-03-09

## 2023-03-05 ENCOUNTER — Other Ambulatory Visit: Payer: Self-pay

## 2023-03-05 ENCOUNTER — Encounter: Payer: Self-pay | Admitting: Internal Medicine

## 2023-03-05 DIAGNOSIS — E7800 Pure hypercholesterolemia, unspecified: Secondary | ICD-10-CM

## 2023-03-05 NOTE — Progress Notes (Signed)
Will do! I think Dr End wanted to repeat liver functions too. Can you add that to the lipid panel please? Thank you for your quick reply.

## 2023-03-08 ENCOUNTER — Other Ambulatory Visit
Admission: RE | Admit: 2023-03-08 | Discharge: 2023-03-08 | Disposition: A | Discharge status: Home / Self Care | Payer: Medicare Other | Source: Ambulatory Visit | Attending: Internal Medicine | Admitting: Internal Medicine

## 2023-03-08 DIAGNOSIS — E7800 Pure hypercholesterolemia, unspecified: Secondary | ICD-10-CM | POA: Diagnosis present

## 2023-03-08 LAB — LIPID PANEL
Cholesterol: 205 mg/dL — ABNORMAL HIGH (ref 0–200)
HDL: 96 mg/dL (ref >40)
LDL Cholesterol: 101 mg/dL — ABNORMAL HIGH (ref 0–99)
Total CHOL/HDL Ratio: 2.1 RATIO
Triglycerides: 38 mg/dL (ref <150)
VLDL: 8 mg/dL (ref 0–40)

## 2023-03-08 LAB — HEPATIC FUNCTION PANEL
ALT: 23 U/L (ref 0–44)
AST: 22 U/L (ref 15–41)
Albumin: 4.4 g/dL (ref 3.5–5.0)
Alkaline Phosphatase: 70 U/L (ref 38–126)
Bilirubin, Direct: 0.1 mg/dL (ref 0.0–0.2)
Indirect Bilirubin: 1.2 mg/dL — ABNORMAL HIGH (ref 0.3–0.9)
Total Bilirubin: 1.3 mg/dL — ABNORMAL HIGH (ref 0.3–1.2)
Total Protein: 7.3 g/dL (ref 6.5–8.1)

## 2023-03-08 LAB — CK: Total CK: 71 U/L (ref 38–234)

## 2023-03-09 ENCOUNTER — Encounter: Payer: Self-pay | Admitting: Internal Medicine

## 2023-03-09 ENCOUNTER — Ambulatory Visit: Payer: Medicare Other | Attending: Internal Medicine | Admitting: Internal Medicine

## 2023-03-09 VITALS — BP 102/60 | HR 62 | Ht 63.0 in | Wt 138.4 lb

## 2023-03-09 DIAGNOSIS — Z79899 Other long term (current) drug therapy: Secondary | ICD-10-CM | POA: Diagnosis not present

## 2023-03-09 DIAGNOSIS — I493 Ventricular premature depolarization: Secondary | ICD-10-CM | POA: Diagnosis not present

## 2023-03-09 DIAGNOSIS — E7800 Pure hypercholesterolemia, unspecified: Secondary | ICD-10-CM | POA: Insufficient documentation

## 2023-03-09 DIAGNOSIS — I471 Supraventricular tachycardia, unspecified: Secondary | ICD-10-CM | POA: Insufficient documentation

## 2023-03-09 DIAGNOSIS — I700 Atherosclerosis of aorta: Secondary | ICD-10-CM | POA: Diagnosis present

## 2023-03-09 DIAGNOSIS — I491 Atrial premature depolarization: Secondary | ICD-10-CM | POA: Diagnosis not present

## 2023-03-09 NOTE — Progress Notes (Signed)
Follow-up Outpatient Visit Date: 03/09/2023  Primary Care Provider: Allegra Grana, FNP [address] [place] [address]  Chief Complaint: Follow-up palpitations and hyperlipidemia  HPI:  Ms. Fedorko is a 67 y.o. female with history of chronic small pericardial effusion, PSVT/PACs/PVCs, pancreatitis, shingles, and claustrophobia, who presents for follow-up of PVCs and hyperlipidemia.  I last saw her in December, at which time she reported continued sporadic palpitations sometimes lasting up to 4 hours.  We discussed repeat cardiac monitoring, though Ms. Katona has struggled in the past with keeping the monitors affixed to her skin.  We also discussed empiric trial of a beta-blocker but agreed to defer monitoring and beta-blocker initiation.  Ms. Kirksey was started on rosuvastatin 5 mg daily due to persistently elevated LDL.  Significant improvement noted on repeat labs earlier this week.  Today, Ms. Goin reports that she has been doing well without any further palpitations since our last visit.  She is tolerating rosuvastatin well.  She notes some soreness in her chest wall after doing upper body exercises but otherwise has not had any chest pain.  She also denies shortness of breath, lightheadedness, and edema.  --------------------------------------------------------------------------------------------------  Cardiovascular History & Procedures: Cardiovascular Problems: Palpitations/PVC's Pericardial effusion   Risk Factors: Age greater than 65   Cath/PCI: None   CV Surgery: None   EP Procedures and Devices: 14-day event monitor (04/28/2022): Predominantly sinus rhythm with rare PACs and PVCs as well as a few brief episodes of PSVT.  No sustained arrhythmia observed. 3-day event monitor (08/27/2019): Predominantly sinus rhythm with rare PACs and frequent PVCs (PVC burden 14.5%).  Single episode of brief PSVT also noted. 24-hour Holter monitor  (11/13/2016): Rare PACs and PVCs.  Single 3 beat atrial run.  No sustained arrhythmia or prolonged pause.   Non-Invasive Evaluation(s): Pharmacologic MPI (10/31/2019): Low risk study without ischemia or scar.  No coronary artery calcification.  LVEF 69%.  PVCs noted during study. TTE (08/21/2019): Normal LV size and wall thickness.  LVEF 60-65% with grade 2 diastolic dysfunction.  Normal RV size and function.  Mild left atrial enlargement.  Mild to moderate tricuspid regurgitation.  Upper normal PA pressure.  Mildly elevated CVP.  Small pericardial effusion. Limited TTE (04/03/2017): Upper normal LV size with normal wall thickness.  LVEF 60-65% with normal wall motion.  Small pericardial effusion, stable to minimally increased in size from 10/2016. TTE (11/15/2016): Normal LV size with upper normal wall thickness.  LVEF 55-60% with normal wall motion and grade 1 diastolic dysfunction.  Mild aortic and tricuspid regurgitation.  Normal RV size and function dilated IVC.  Trivial pericardial effusion.  Recent CV Pertinent Labs: Lab Results  Component Value Date   CHOL 205 (H) 03/08/2023   CHOL 272 (H) 08/19/2021   HDL 96 03/08/2023   HDL 86 08/19/2021   LDLCALC 101 (H) 03/08/2023   LDLCALC 171 (H) 08/19/2021   LDLDIRECT 151.3 10/19/2011   TRIG 38 03/08/2023   CHOLHDL 2.1 03/08/2023   K 3.9 08/15/2022   K 3.7 09/20/2014   MG 2.1 07/17/2019   BUN 23 08/15/2022   BUN 21 08/19/2021   BUN 15 09/20/2014   CREATININE 0.56 08/15/2022   CREATININE 0.64 09/20/2014    Past medical and surgical history were reviewed and updated in EPIC.  Current Meds  Medication Sig   acetaminophen (TYLENOL) 650 MG CR tablet Take 650 mg by mouth as needed.   cholecalciferol (VITAMIN D) 1000 UNITS tablet Take 1,000 Units by mouth daily.  rosuvastatin (CRESTOR) 5 MG tablet Take 1 tablet (5 mg total) by mouth daily.    Allergies: Estradiol, Pantoprazole sodium, Pollen extract, and Prednisolone  Social History    Tobacco Use   Smoking status: Never   Smokeless tobacco: Never  Vaping Use   Vaping Use: Never used  Substance Use Topics   Alcohol use: Yes    Comment: 6 glasses of wine a month   Drug use: No    Family History  Problem Relation Age of Onset   Colon cancer Mother 65   Lymphoma Mother    Cancer Mother 68       lymphoma and colon   Heart disease Father 2   Multiple sclerosis Sister    Depression Sister    Diabetes Sister    Breast cancer Maternal Grandmother    Coronary artery disease Other    Heart attack Other        40's    Sudden Cardiac Death Other     Review of Systems: A 12-system review of systems was performed and was negative except as noted in the HPI.  --------------------------------------------------------------------------------------------------  Physical Exam: BP 102/60 (BP Location: Left Arm, Patient Position: Sitting, Cuff Size: Normal)   Pulse 62   Ht 5\' 3"  (1.6 m)   Wt 138 lb 6 oz (62.8 kg)   SpO2 98%   BMI 24.51 kg/m   General:  NAD. Neck: No JVD or HJR. Lungs: Clear to auscultation bilaterally without wheezes or crackles. Heart: Regular rate and rhythm without murmurs, rubs, or gallops. Abdomen: Soft, nontender, nondistended. Extremities: No lower extremity edema.  EKG: Normal sinus rhythm without abnormalities.  Lab Results  Component Value Date   WBC 4.8 08/15/2022   HGB 13.6 08/15/2022   HCT 40.5 08/15/2022   MCV 98.6 08/15/2022   PLT 211.0 08/15/2022    Lab Results  Component Value Date   NA 140 08/15/2022   K 3.9 08/15/2022   CL 104 08/15/2022   CO2 28 08/15/2022   BUN 23 08/15/2022   CREATININE 0.56 08/15/2022   GLUCOSE 92 08/15/2022   ALT 23 03/08/2023    Lab Results  Component Value Date   CHOL 205 (H) 03/08/2023   HDL 96 03/08/2023   LDLCALC 101 (H) 03/08/2023   LDLDIRECT 151.3 10/19/2011   TRIG 38 03/08/2023   CHOLHDL 2.1 03/08/2023     --------------------------------------------------------------------------------------------------  ASSESSMENT AND PLAN: PSVT, PACs, and PVCs: No palpitations noted since our last visit.  Defer further testing and intervention at this time.  Hyperlipidemia, hyperbilirubinemia, and aortic atherosclerosis: Significant improvement in LDL noted with initiation of low-dose rosuvastatin (LDL 191 -> 101).  Minimal elevation in total/indirect bilirubin noted, which has been observed intermittently in the past.  No abdominal symptoms reported.  We will plan to repeat a hepatic function panel about 3 months.  Continue low-dose rosuvastatin.  Follow-up: Return to clinic in 1 year.  Yvonne Kendall, MD 03/09/2023 10:51 AM

## 2023-03-09 NOTE — Patient Instructions (Addendum)
Medication Instructions:   Your physician recommends that you continue on your current medications as directed. Please refer to the Current Medication list given to you today.  *If you need a refill on your cardiac medications before your next appointment, please call your pharmacy*   Lab Work:  Your physician recommends that you return for lab work in: 3 months.  Hepatic Function Panel  - Please go to the [facility]. You will check in at the front desk to the right as you walk into the atrium. Valet Parking is offered if needed. - No appointment needed. You may go any day between 7 am and 6 pm.   If you have labs (blood work) drawn today and your tests are completely normal, you will receive your results only by: MyChart Message (if you have MyChart) OR A paper copy in the mail If you have any lab test that is abnormal or we need to change your treatment, we will call you to review the results.   Testing/Procedures: No testing ordered.   Follow-Up: At [facility], you and your health needs are our priority.  As part of our continuing mission to provide you with exceptional heart care, we have created designated Provider Care Teams.  These Care Teams include your primary Cardiologist (physician) and Advanced Practice Providers (APPs -  Physician Assistants and Nurse Practitioners) who all work together to provide you with the care you need, when you need it.  We recommend signing up for the patient portal called "MyChart".  Sign up information is provided on this After Visit Summary.  MyChart is used to connect with patients for Virtual Visits (Telemedicine).  Patients are able to view lab/test results, encounter notes, upcoming appointments, etc.  Non-urgent messages can be sent to your provider as well.   To learn more about what you can do with MyChart, go to [email or web address].    Your next appointment:   12 month(s)  Provider:   You may see Yvonne Kendall, MD or one of the following Advanced Practice Providers on your designated Care Team:   Nicolasa Ducking, NP Eula Listen, PA-C Cadence Fransico Michael, PA-C Charlsie Quest, NP

## 2023-04-09 ENCOUNTER — Telehealth: Payer: Self-pay | Admitting: Family

## 2023-04-09 NOTE — Telephone Encounter (Signed)
Patient returned referrals phone call. 

## 2023-04-12 NOTE — Telephone Encounter (Signed)
Spoke to pt and she stated that she has it taken care of pt spoke to [place]

## 2023-05-07 ENCOUNTER — Telehealth: Payer: Self-pay | Admitting: Internal Medicine

## 2023-05-07 MED ORDER — ROSUVASTATIN CALCIUM 5 MG PO TABS: 5.0000 mg | ORAL_TABLET | Freq: Every day | ORAL | 2 refills | Status: DC

## 2023-05-07 NOTE — Telephone Encounter (Signed)
Pt c/o medication issue:  1. Name of Medication:   rosuvastatin (CRESTOR) 5 MG tablet    2. How are you currently taking this medication (dosage and times per day)?   3. Are you having a reaction (difficulty breathing--STAT)?   4. What is your medication issue? Patient is requesting medication be sent to:    [facility] - [place], [place] - [address]

## 2023-06-08 ENCOUNTER — Other Ambulatory Visit
Admission: RE | Admit: 2023-06-08 | Discharge: 2023-06-08 | Disposition: A | Discharge status: Home / Self Care | Payer: Medicare Other | Attending: Internal Medicine | Admitting: Internal Medicine

## 2023-06-08 DIAGNOSIS — E7800 Pure hypercholesterolemia, unspecified: Secondary | ICD-10-CM | POA: Diagnosis present

## 2023-06-08 DIAGNOSIS — Z79899 Other long term (current) drug therapy: Secondary | ICD-10-CM | POA: Insufficient documentation

## 2023-06-08 DIAGNOSIS — I493 Ventricular premature depolarization: Secondary | ICD-10-CM | POA: Insufficient documentation

## 2023-06-08 DIAGNOSIS — I700 Atherosclerosis of aorta: Secondary | ICD-10-CM | POA: Insufficient documentation

## 2023-06-08 LAB — HEPATIC FUNCTION PANEL
ALT: 21 U/L (ref 0–44)
AST: 23 U/L (ref 15–41)
Albumin: 4.2 g/dL (ref 3.5–5.0)
Alkaline Phosphatase: 74 U/L (ref 38–126)
Bilirubin, Direct: <0.1 mg/dL (ref 0.0–0.2)
Total Bilirubin: 1.1 mg/dL (ref 0.3–1.2)
Total Protein: 7.4 g/dL (ref 6.5–8.1)

## 2023-07-09 IMAGING — DX DG CERVICAL SPINE 2 OR 3 VIEWS
3 series · 3 of 3 positions shown · non-contrast
Comparison: None.

CLINICAL DATA: Chronic neck pain.

EXAM:
CERVICAL SPINE - 2-3 VIEW

[c-spine lat]
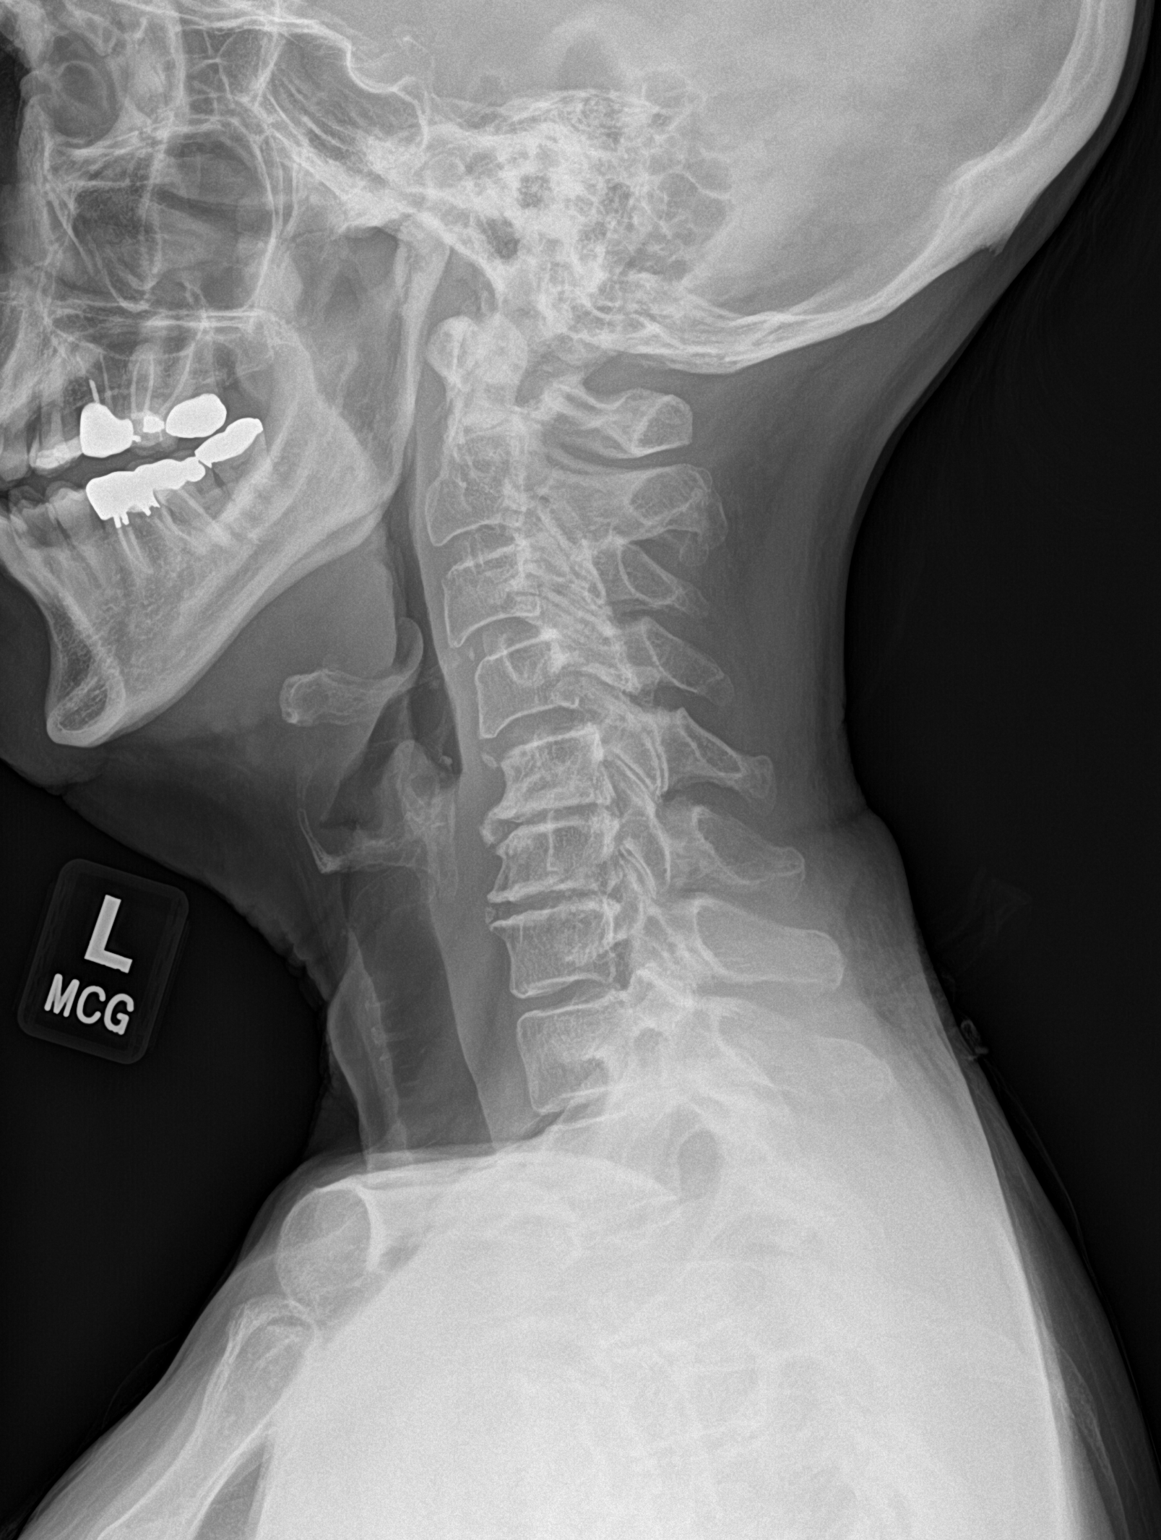

[c-spine ap]
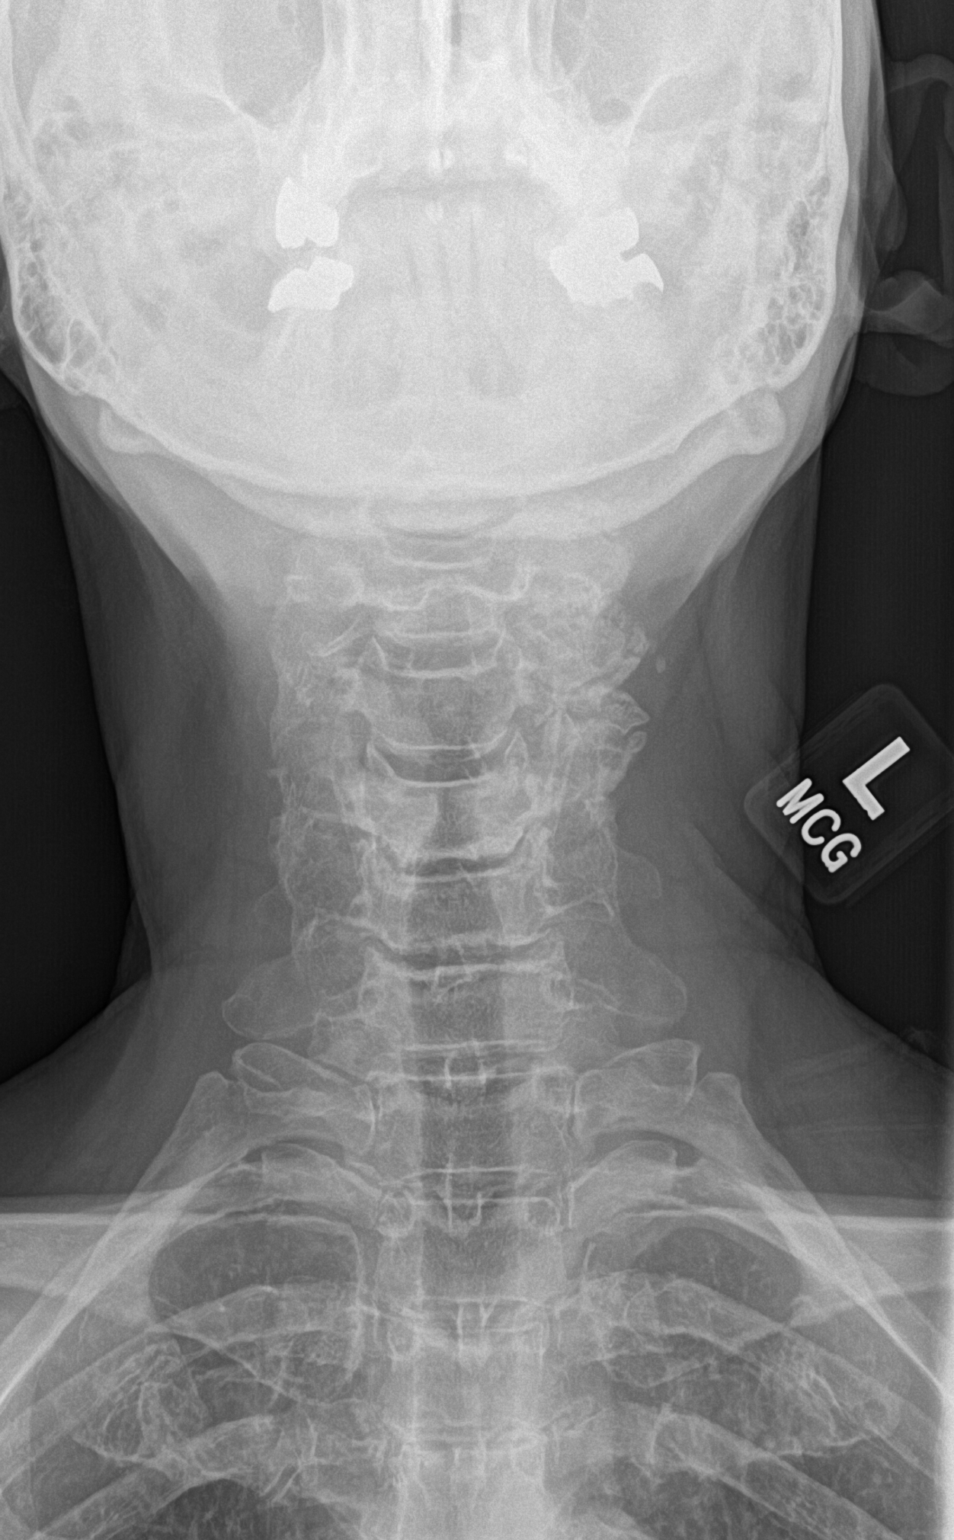

[c-spine open mouth]
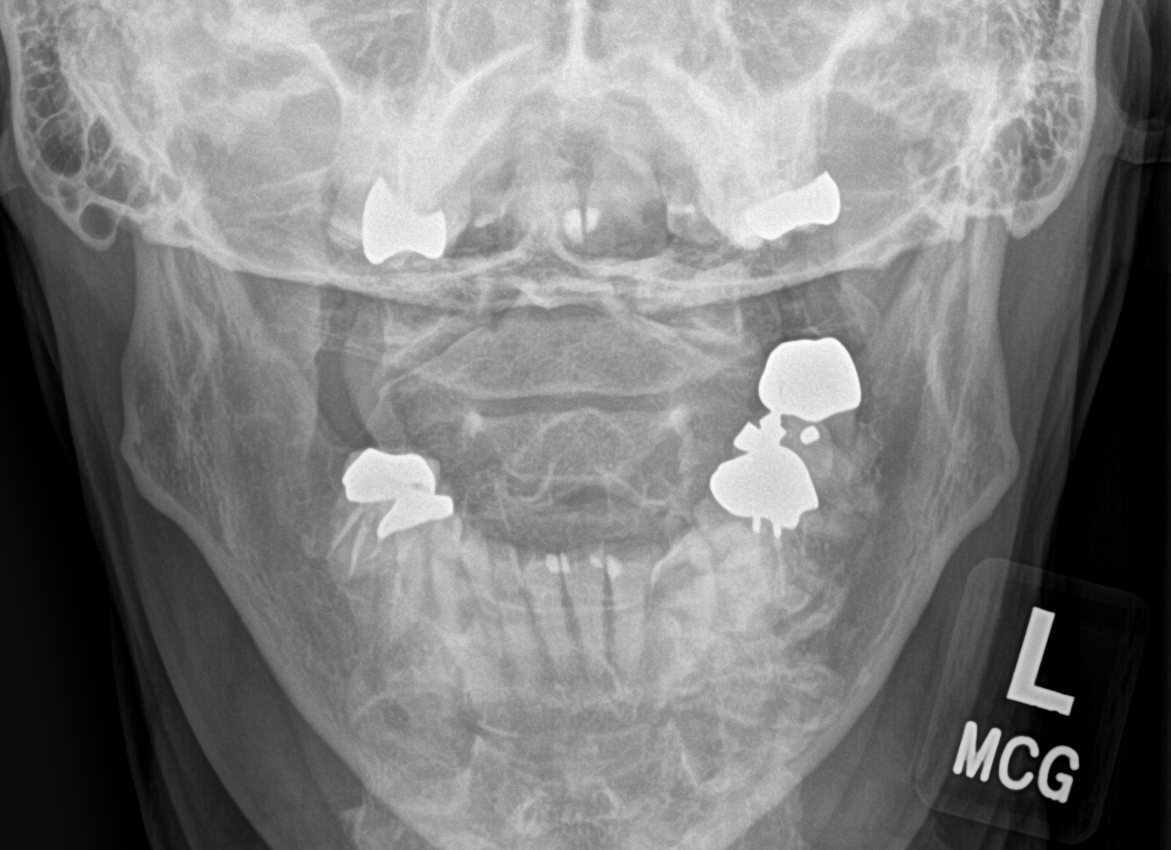

[3 of 3 positions shown; findings below may reference images not displayed]

FINDINGS: There is straightening of normal cervical lordosis. Spinal alignment
is normal. There is no evidence for acute fracture. There is
moderate disc space narrowing and endplate osteophyte formation at
C5-C6 and C6-C7 compatible with degenerative change. There is no
prevertebral soft tissue swelling.
IMPRESSION: 1. No evidence for fracture or malalignment.
2. Moderate degenerative changes of the lower cervical spine.

## 2023-07-11 ENCOUNTER — Telehealth: Payer: Self-pay | Admitting: Family

## 2023-07-11 NOTE — Telephone Encounter (Signed)
Copied from CRM [id number]. Topic: Medicare AWV >> Jul 11, 2023  1:58 PM Payton Doughty wrote: Reason for CRM: Called LVM 07/11/2023 to schedule Annual Wellness Visit  Verlee Rossetti; Care Guide Ambulatory Clinical Support [facility] Direct Dial: [phone]

## 2023-07-25 NOTE — Telephone Encounter (Signed)
Copied from CRM [id number]. Topic: Medicare AWV >> Jul 25, 2023  1:54 PM Payton Doughty wrote: Reason for CRM: Called LVM 07/25/2023 to schedule Annual Wellness Visit  Verlee Rossetti; Care Guide Ambulatory Clinical Support [facility] Direct Dial: [phone]

## 2023-07-31 ENCOUNTER — Encounter: Payer: Self-pay | Admitting: Family

## 2023-07-31 ENCOUNTER — Ambulatory Visit: Payer: Medicare Other | Admitting: Family

## 2023-07-31 VITALS — BP 110/78 | HR 65 | Temp 97.9°F | Ht 63.0 in | Wt 141.0 lb

## 2023-07-31 DIAGNOSIS — E7800 Pure hypercholesterolemia, unspecified: Secondary | ICD-10-CM | POA: Diagnosis not present

## 2023-07-31 DIAGNOSIS — Z1231 Encounter for screening mammogram for malignant neoplasm of breast: Secondary | ICD-10-CM | POA: Diagnosis not present

## 2023-07-31 DIAGNOSIS — Z8639 Personal history of other endocrine, nutritional and metabolic disease: Secondary | ICD-10-CM | POA: Diagnosis not present

## 2023-07-31 DIAGNOSIS — Z780 Asymptomatic menopausal state: Secondary | ICD-10-CM

## 2023-07-31 DIAGNOSIS — Z0000 Encounter for general adult medical examination without abnormal findings: Secondary | ICD-10-CM | POA: Diagnosis not present

## 2023-07-31 DIAGNOSIS — E559 Vitamin D deficiency, unspecified: Secondary | ICD-10-CM

## 2023-07-31 DIAGNOSIS — G250 Essential tremor: Secondary | ICD-10-CM | POA: Insufficient documentation

## 2023-07-31 DIAGNOSIS — I493 Ventricular premature depolarization: Secondary | ICD-10-CM

## 2023-07-31 MED ORDER — ROSUVASTATIN CALCIUM 5 MG PO TABS
5.0000 mg | ORAL_TABLET | Freq: Every day | ORAL | 3 refills | Status: DC
Start: 2023-07-31 — End: 2024-08-05

## 2023-07-31 NOTE — Assessment & Plan Note (Addendum)
Congratulated patient on diligence to exercise.  Deferred pelvic exam as Pap smear is not due and patient continues to follow with GYN.  Patient will schedule screening mammogram and bone density at [facility].

## 2023-07-31 NOTE — Assessment & Plan Note (Addendum)
Presentation most consistent with essential tremor.  We discussed the role of beta-blocker.  History of PVCs as well. discussed average heart rate on zio mointor as 69 in 2021 and jointly we would exercise caution with using propranolol low dose as needed for tremor.  As tremor is not particularly bothersome at this time, we jointly agreed to monitor.

## 2023-07-31 NOTE — Progress Notes (Signed)
Assessment & Plan:  Pure hypercholesterolemia -     Rosuvastatin Calcium; Take 1 tablet (5 mg total) by mouth daily.  Dispense: 90 tablet; Refill: 3  Routine general medical examination at a health care facility Assessment & Plan: Congratulated patient on diligence to exercise.  Deferred pelvic exam as Pap smear is not due and patient continues to follow with GYN.  Patient will schedule screening mammogram and bone density at [facility].   Orders: -     VITAMIN D 25 Hydroxy (Vit-D Deficiency, Fractures) -     CBC with Differential/Platelet -     Comprehensive metabolic panel -     TSH -     Lipid panel -     3D Screening Mammogram, Left and Right; Future -     DG Bone Density; Future -     Rosuvastatin Calcium; Take 1 tablet (5 mg total) by mouth daily.  Dispense: 90 tablet; Refill: 3  History of vitamin D deficiency -     VITAMIN D 25 Hydroxy (Vit-D Deficiency, Fractures)  Encounter for screening mammogram for malignant neoplasm of breast -     3D Screening Mammogram, Left and Right; Future  Asymptomatic postmenopausal state -     DG Bone Density; Future  PVC's (premature ventricular contractions) -     CBC with Differential/Platelet -     TSH  Essential tremor Assessment & Plan: Presentation most consistent with essential tremor.  We discussed the role of beta-blocker.  History of PVCs as well. discussed average heart rate on zio mointor as 69 in 2021 and jointly we would exercise caution with using propranolol low dose as needed for tremor.  As tremor is not particularly bothersome at this time, we jointly agreed to monitor.  Orders: -     CBC with Differential/Platelet -     TSH  Vitamin D deficiency, unspecified -     VITAMIN D 25 Hydroxy (Vit-D Deficiency, Fractures)     Return precautions given.   Risks, benefits, and alternatives of the medications and treatment plan prescribed today were discussed, and patient expressed understanding.   Education regarding  symptom management and diagnosis given to patient on AVS either electronically or printed.  No follow-ups on file.  Rennie Plowman, FNP  Subjective:    Patient ID: Kelly [name], female    DOB: [birth year], 67 y.o.   MRN: [id number]  CC: Kelly [name] is a 67 y.o. female who presents today for physical exam.    HPI: Overall feels well today.   She has noticed right hand tremor , unchanged, for the past couple of years.  She notices it primarily when she is holding a cup, writing.  She mentions that today however she is not significantly bothered by symptom.  She has noticed symptom is more noticeable in the morning; questions if caffeine exacerbates.   No family h/o PD.  No problems with gait or balance.   No falls.    Colorectal Cancer Screening: UTD , due every 5 years due to mother's h/o colon cancer.  Breast Cancer Screening: Mammogram due 10/2023; s/p right breast biopsy 10/2022 Cervical Cancer Screening: UTD 01/2022 ; [facility]. Due next year.   Bone Health screening/DEXA for 65+: last 03/14/2018; normal bone density Lung Cancer Screening: Doesn't have 20 year pack year history and age > 56 years yo 46 years        Tetanus - UTD         Exercise: Gets regular exercise  walking and with a trainor Alcohol use:  occasional wine Smoking/tobacco use: Nonsmoker.    Health Maintenance  Topic Date Due   COVID-19 Vaccine (6 - 2023-24 season) 05/20/2023   Medicare Annual Wellness Visit  07/18/2023   Zoster (Shingles) Vaccine (1 of 2) 10/31/2023*   Mammogram  09/25/2024   Colon Cancer Screening  01/01/2028   DTaP/Tdap/Td vaccine (4 - Td or Tdap) 01/30/2029   Pneumonia Vaccine  Completed   Flu Shot  Completed   DEXA scan (bone density measurement)  Completed   Hepatitis C Screening  Completed   HPV Vaccine  Aged Out  *Topic was postponed. The date shown is not the original due date.    ALLERGIES: Estradiol, Pantoprazole sodium, Pollen extract, and  Prednisolone  Current Outpatient Medications on File Prior to Visit  Medication Sig Dispense Refill   acetaminophen (TYLENOL) 650 MG CR tablet Take 650 mg by mouth as needed.     cholecalciferol (VITAMIN D) 1000 UNITS tablet Take 1,000 Units by mouth daily. (Patient not taking: Reported on 07/31/2023)     No current facility-administered medications on file prior to visit.    Review of Systems  Constitutional:  Negative for chills, fever and unexpected weight change.  HENT:  Negative for congestion.   Respiratory:  Negative for cough.   Cardiovascular:  Negative for chest pain, palpitations and leg swelling.  Gastrointestinal:  Negative for nausea and vomiting.  Musculoskeletal:  Negative for arthralgias and myalgias.  Skin:  Negative for rash.  Neurological:  Positive for tremors. Negative for headaches.  Hematological:  Negative for adenopathy.  Psychiatric/Behavioral:  Negative for confusion.       Objective:    BP 110/78   Pulse 65   Temp 97.9 F (36.6 C) (Oral)   Ht 5\' 3"  (1.6 m)   Wt 141 lb (64 kg)   SpO2 96%   BMI 24.98 kg/m   BP Readings from Last 3 Encounters:  07/31/23 110/78  03/09/23 102/60  02/01/23 106/68   Wt Readings from Last 3 Encounters:  07/31/23 141 lb (64 kg)  03/09/23 138 lb 6 oz (62.8 kg)  02/01/23 137 lb (62.1 kg)    Physical Exam Vitals reviewed.  Constitutional:      Appearance: Normal appearance. She is well-developed.  Eyes:     Conjunctiva/sclera: Conjunctivae normal.  Neck:     Thyroid: No thyroid mass or thyromegaly.  Cardiovascular:     Rate and Rhythm: Normal rate and regular rhythm.     Pulses: Normal pulses.     Heart sounds: Normal heart sounds.  Pulmonary:     Effort: Pulmonary effort is normal.     Breath sounds: Normal breath sounds. No wheezing, rhonchi or rales.  Chest:  Breasts:    Breasts are symmetrical.     Right: No inverted nipple, mass, nipple discharge, skin change or tenderness.     Left: No inverted  nipple, mass, nipple discharge, skin change or tenderness.  Abdominal:     General: Bowel sounds are normal. There is no distension.     Palpations: Abdomen is soft. Abdomen is not rigid. There is no fluid wave or mass.     Tenderness: There is no abdominal tenderness. There is no guarding or rebound.  Lymphadenopathy:     Head:     Right side of head: No submental, submandibular, tonsillar, preauricular, posterior auricular or occipital adenopathy.     Left side of head: No submental, submandibular, tonsillar, preauricular, posterior auricular or occipital  adenopathy.     Cervical: No cervical adenopathy.     Right cervical: No superficial, deep or posterior cervical adenopathy.    Left cervical: No superficial, deep or posterior cervical adenopathy.  Skin:    General: Skin is warm and dry.  Neurological:     Mental Status: She is alert.     Comments: Fine bilateral hand tremor noted hands extended and with finger-to-nose test bilaterally.  Resolves with hands resting on lap. No cogwheeling  Psychiatric:        Speech: Speech normal.        Behavior: Behavior normal.        Thought Content: Thought content normal.

## 2023-08-01 ENCOUNTER — Ambulatory Visit: Payer: Medicare Other | Admitting: *Deleted

## 2023-08-01 VITALS — Ht 63.0 in | Wt 140.0 lb

## 2023-08-01 DIAGNOSIS — Z0000 Encounter for general adult medical examination without abnormal findings: Secondary | ICD-10-CM | POA: Diagnosis not present

## 2023-08-01 NOTE — Progress Notes (Signed)
Subjective:   Kelly [name] is a 67 y.o. female who presents for Medicare Annual (Subsequent) preventive examination.  Visit Complete: Virtual I connected with  [name] on 08/01/23 by a audio enabled telemedicine application and verified that I am speaking with the correct person using two identifiers.  Patient Location: Home  Provider Location: Office/Clinic  I discussed the limitations of evaluation and management by telemedicine. The patient expressed understanding and agreed to proceed.  Vital Signs: Because this visit was a virtual/telehealth visit, some criteria may be missing or patient reported. Any vitals not documented were not able to be obtained and vitals that have been documented are patient reported.   Cardiac Risk Factors include: advanced age (>34men, >26 women);dyslipidemia;Other (see comment), Risk factor comments: PVC's     Objective:    Today's Vitals   08/01/23 1404  Weight: 140 lb (63.5 kg)  Height: 5\' 3"  (1.6 m)   Body mass index is 24.8 kg/m.     08/01/2023    2:14 PM 07/17/2022    3:51 PM 07/15/2021    9:50 AM 07/14/2020    9:43 AM 08/30/2017    9:33 AM  Advanced Directives  Does Patient Have a Medical Advance Directive? Yes Yes Yes No Yes  Type of Estate agent of [place];Living will Healthcare Power of [place];Living will Healthcare Power of [place];Living will  Living will  Does patient want to make changes to medical advance directive?  No - Patient declined No - Patient declined    Copy of Healthcare Power of Attorney in Chart? No - copy requested No - copy requested No - copy requested      Current Medications (verified) Outpatient Encounter Medications as of 08/01/2023  Medication Sig   acetaminophen (TYLENOL) 650 MG CR tablet Take 650 mg by mouth as needed.   cholecalciferol (VITAMIN D) 1000 UNITS tablet Take 1,000 Units by mouth daily.   rosuvastatin (CRESTOR) 5 MG tablet Take 1 tablet (5 mg  total) by mouth daily.   No facility-administered encounter medications on file as of 08/01/2023.    Allergies (verified) Estradiol, Pantoprazole sodium, Pollen extract, and Prednisolone   History: Past Medical History:  Diagnosis Date   Claustrophobia    Pancreatitis 2009, 2005   Shingles    Shingles outbreak 2006   Past Surgical History:  Procedure Laterality Date   BREAST BIOPSY     Nml per pt , f/u mammo 10/2011   CHOLECYSTECTOMY     COLONOSCOPY WITH PROPOFOL N/A 08/30/2017   Procedure: COLONOSCOPY WITH PROPOFOL;  Surgeon: Christena Deem, MD;  Location: [facility]COPY;  Service: Endoscopy;  Laterality: N/A;   EXPLORATORY LAPAROTOMY     TRIGGER FINGER RELEASE     VAGINAL DELIVERY  09/18/1992   Family History  Problem Relation Age of Onset   Colon cancer Mother 65   Lymphoma Mother    Cancer Mother 18       lymphoma and colon   Heart disease Father 101   Multiple sclerosis Sister    Depression Sister    Diabetes Sister    Breast cancer Maternal Grandmother    Coronary artery disease Other    Heart attack Other        40's    Sudden Cardiac Death Other    Social History   Socioeconomic History   Marital status: Married    Spouse name: Not on file   Number of children: 1    Years of education: Not on  file   Highest education level: Master's degree (e.g., MA, MS, MEng, MEd, MSW, MBA)  Occupational History   Occupation: Nurse Midwife - [employer]   Tobacco Use   Smoking status: Never   Smokeless tobacco: Never  Vaping Use   Vaping status: Never Used  Substance and Sexual Activity   Alcohol use: Yes    Comment: 6 glasses of wine a month   Drug use: No   Sexual activity: Not on file  Other Topics Concern   Not on file  Social History Financial trader until 1997      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 from [employer]      Son and daughter in Social worker, first grandson 08/26/23; son is Librarian, academic in [place].       She has a Charity fundraiser  Strain: Low Risk  (08/01/2023)   Overall Financial Resource Strain (CARDIA)    Difficulty of Paying Living Expenses: Not hard at all  Food Insecurity: No Food Insecurity (08/01/2023)   Hunger Vital Sign    Worried About Running Out of Food in the Last Year: Never true    Ran Out of Food in the Last Year: Never true  Transportation Needs: No Transportation Needs (08/01/2023)   PRAPARE - Administrator, Civil Service (Medical): No    Lack of Transportation (Non-Medical): No  Physical Activity: Sufficiently Active (08/01/2023)   Exercise Vital Sign    Days of Exercise per Week: 3 days    Minutes of Exercise per Session: 60 min  Stress: No Stress Concern Present (08/01/2023)   [employer] of Occupational Health - Occupational Stress Questionnaire    Feeling of Stress : Only a little  Social Connections: Socially Integrated (08/01/2023)   Social Connection and Isolation Panel [NHANES]    Frequency of Communication with Friends and Family: More than three times a week    Frequency of Social Gatherings with Friends and Family: Once a week    Attends Religious Services: More than 4 times per year    Active Member of [employer] or Organizations: Yes    Attends Engineer, structural: More than 4 times per year    Marital Status: Married    Tobacco Counseling Counseling given: Not Answered   Clinical Intake:  Pre-visit preparation completed: Yes  Pain : No/denies pain     BMI - recorded: 24.8 Nutritional Status: BMI of 19-24  Normal Nutritional Risks: None Diabetes: No  How often do you need to have someone help you when you read instructions, pamphlets, or other written materials from your doctor or pharmacy?: 1 - Never  Interpreter Needed?: No  Information entered by :: R. Trenita Hulme LPN   Activities of Daily Living    08/01/2023    2:05 PM  In your present state of health, do you have any difficulty performing the following activities:  Hearing? 0   Vision? 0  Comment glasses  Difficulty concentrating or making decisions? 0  Walking or climbing stairs? 0  Dressing or bathing? 0  Doing errands, shopping? 0  Preparing Food and eating ? N  Using the Toilet? N  In the past six months, have you accidently leaked urine? Y  Do you have problems with loss of bowel control? N  Managing your Medications? N  Managing your Finances? N  Housekeeping or managing your Housekeeping? N    Patient Care Team: Allegra Grana, FNP as PCP - General (Family Medicine)  Yvonne Kendall, MD as PCP - Cardiology (Cardiology) End, Cristal Deer, MD as Consulting Physician (Cardiology)  Indicate any recent Medical Services you may have received from other than [facility] providers in the past year (date may be approximate).     Assessment:   This is a routine wellness examination for [place].  Hearing/Vision screen Hearing Screening - Comments:: No issues Vision Screening - Comments:: glasses   Goals Addressed             This Visit's Progress    Patient Stated       Wants to continue her work outs       Depression Screen    08/01/2023    2:09 PM 07/31/2023    8:26 AM 07/25/2022    8:35 AM 07/17/2022    3:53 PM 07/15/2021    9:52 AM 07/14/2020    9:38 AM 07/03/2019    1:59 PM  PHQ 2/9 Scores  PHQ - 2 Score 0 0 0 0 0 0 0  PHQ- 9 Score 0 0 0    0    Fall Risk    08/01/2023    2:07 PM 07/31/2023    8:26 AM 07/25/2022    8:35 AM 07/17/2022    3:53 PM 07/15/2021   10:04 AM  Fall Risk   Falls in the past year? 0 0 0 0 0  Number falls in past yr: 0 0 0 0 0  Injury with Fall? 0 0 0 0   Risk for fall due to : No Fall Risks No Fall Risks No Fall Risks No Fall Risks   Follow up Falls prevention discussed;Falls evaluation completed Falls evaluation completed Falls evaluation completed Falls evaluation completed     MEDICARE RISK AT HOME: Medicare Risk at Home Any stairs in or around the home?: Yes If so, are there any without  handrails?: No Home free of loose throw rugs in walkways, pet beds, electrical cords, etc?: Yes Adequate lighting in your home to reduce risk of falls?: Yes Life alert?: No Use of a cane, walker or w/c?: No Grab bars in the bathroom?: No Shower chair or bench in shower?: Yes Elevated toilet seat or a handicapped toilet?: No  T Cognitive Function:        08/01/2023    2:14 PM 07/17/2022    4:00 PM 07/14/2020    9:44 AM  6CIT Screen  What Year? 0 points  0 points  What month? 0 points  0 points  What time? 0 points  0 points  Count back from 20 0 points  0 points  Months in reverse 0 points 0 points 0 points  Repeat phrase 0 points    Total Score 0 points      Immunizations Immunization History  Administered Date(s) Administered   DTaP 01/31/2019   Fluad Quad(high Dose 65+) 06/27/2021, 05/29/2023   Hepatitis A, Adult 06/26/1995, 08/19/1995   Influenza Split 07/18/2011   Influenza-Unspecified 08/06/2013, 06/25/2015, 06/23/2022   Meningococcal polysaccharide vaccine (MPSV4) 11/09/1995   OPV 10/19/1974   PFIZER(Purple Top)SARS-COV-2 Vaccination 09/15/2019, 10/09/2019, 06/21/2020, 07/13/2021   Pfizer Covid-19 Vaccine Bivalent Booster 61yrs & up 07/06/2022   Pfizer(Comirnaty)Fall Seasonal Vaccine 12 years and older 06/23/2022   Pneumococcal Conjugate-13 04/07/2019   Pneumococcal Polysaccharide-23 05/04/2020   Tdap 09/18/2009, 01/31/2019   Yellow Fever 02/26/1995    TDAP status: Up to date  Flu Vaccine status: Up to date  Pneumococcal vaccine status: Up to date  Covid-19 vaccine status: Completed vaccines  Qualifies for  Shingles Vaccine? Yes   Zostavax completed No   Shingrix Completed?: No.    Education has been provided regarding the importance of this vaccine. Patient has been advised to call insurance company to determine out of pocket expense if they have not yet received this vaccine. Advised may also receive vaccine at local pharmacy or Health Dept. Verbalized  acceptance and understanding.  Screening Tests Health Maintenance  Topic Date Due   COVID-19 Vaccine (6 - 2023-24 season) 05/20/2023   Medicare Annual Wellness (AWV)  07/18/2023   Zoster Vaccines- Shingrix (1 of 2) 10/31/2023 (Originally 09/23/1974)   MAMMOGRAM  09/25/2024   Colonoscopy  01/01/2028   DTaP/Tdap/Td (4 - Td or Tdap) 01/30/2029   Pneumonia Vaccine 55+ Years old  Completed   INFLUENZA VACCINE  Completed   DEXA SCAN  Completed   Hepatitis C Screening  Completed   HPV VACCINES  Aged Out    Health Maintenance  Health Maintenance Due  Topic Date Due   COVID-19 Vaccine (6 - 2023-24 season) 05/20/2023   Medicare Annual Wellness (AWV)  07/18/2023    Colorectal cancer screening: Type of screening: Colonoscopy. Completed 12/2022. Repeat every 5 years  Mammogram status: Completed 09/2022. Repeat every year  Bone Density status: Completed 02/2018. Results reflect: Bone density results: OSTEOPENIA. Repeat every 2 years. Order was placed 07/31/23  Lung Cancer Screening: (Low Dose CT Chest recommended if Age 35-80 years, 20 pack-year currently smoking OR have quit w/in 15years.) does not qualify.   Additional Screening:  Hepatitis C Screening: does qualify; Completed 02/2018  Vision Screening: Recommended annual ophthalmology exams for early detection of glaucoma and other disorders of the eye. Is the patient up to date with their annual eye exam?  Yes  Who is the provider or what is the name of the office in which the patient attends annual eye exams? [facility] If pt is not established with a provider, would they like to be referred to a provider to establish care? No .   Dental Screening: Recommended annual dental exams for proper oral hygiene    Community Resource Referral / Chronic Care Management: CRR required this visit?  No   CCM required this visit?  No     Plan:     I have personally reviewed and noted the following in the patient's chart:   Medical and  social history Use of alcohol, tobacco or illicit drugs  Current medications and supplements including opioid prescriptions. Patient is not currently taking opioid prescriptions. Functional ability and status Nutritional status Physical activity Advanced directives List of other physicians Hospitalizations, surgeries, and ER visits in previous 12 months Vitals Screenings to include cognitive, depression, and falls Referrals and appointments  In addition, I have reviewed and discussed with patient certain preventive protocols, quality metrics, and best practice recommendations. A written personalized care plan for preventive services as well as general preventive health recommendations were provided to patient.     Sydell Axon, LPN   [id number]   After Visit Summary: (MyChart) Due to this being a telephonic visit, the after visit summary with patients personalized plan was offered to patient via MyChart   Nurse Notes: None

## 2023-08-01 NOTE — Patient Instructions (Signed)
Kelly [name] , Thank you for taking time to come for your Medicare Wellness Visit. I appreciate your ongoing commitment to your health goals. Please review the following plan we discussed and let me know if I can assist you in the future.   Referrals/Orders/Follow-Ups/Clinician Recommendations: None  This is a list of the screening recommended for you and due dates:  Health Maintenance  Topic Date Due   Zoster (Shingles) Vaccine (1 of 2) 10/31/2023*   COVID-19 Vaccine (7 - 2023-24 season) 04/18/2024*   Medicare Annual Wellness Visit  07/31/2024   Mammogram  09/25/2024   Colon Cancer Screening  01/01/2028   DTaP/Tdap/Td vaccine (4 - Td or Tdap) 01/30/2029   Pneumonia Vaccine  Completed   Flu Shot  Completed   DEXA scan (bone density measurement)  Completed   Hepatitis C Screening  Completed   HPV Vaccine  Aged Out  *Topic was postponed. The date shown is not the original due date.    Advanced directives: (Copy Requested) Please bring a copy of your health care power of attorney and living will to the office to be added to your chart at your convenience.  Next Medicare Annual Wellness Visit scheduled for next year: Yes 08/04/24 @ 2:00

## 2023-08-02 ENCOUNTER — Other Ambulatory Visit: Payer: Medicare Other

## 2023-08-02 DIAGNOSIS — G250 Essential tremor: Secondary | ICD-10-CM

## 2023-08-02 DIAGNOSIS — Z0000 Encounter for general adult medical examination without abnormal findings: Secondary | ICD-10-CM | POA: Diagnosis not present

## 2023-08-02 DIAGNOSIS — I493 Ventricular premature depolarization: Secondary | ICD-10-CM | POA: Diagnosis not present

## 2023-08-02 DIAGNOSIS — Z8639 Personal history of other endocrine, nutritional and metabolic disease: Secondary | ICD-10-CM

## 2023-08-02 DIAGNOSIS — E559 Vitamin D deficiency, unspecified: Secondary | ICD-10-CM

## 2023-08-02 LAB — COMPREHENSIVE METABOLIC PANEL
ALT: 21 U/L (ref 0–35)
AST: 23 U/L (ref 0–37)
Albumin: 4.5 g/dL (ref 3.5–5.2)
Alkaline Phosphatase: 75 U/L (ref 39–117)
BUN: 20 mg/dL (ref 6–23)
CO2: 28 meq/L (ref 19–32)
Calcium: 9.4 mg/dL (ref 8.4–10.5)
Chloride: 107 meq/L (ref 96–112)
Creatinine, Ser: 0.7 mg/dL (ref 0.40–1.20)
GFR: 87.97 mL/min (ref >60.00)
Glucose, Bld: 93 mg/dL (ref 70–99)
Potassium: 4 meq/L (ref 3.5–5.1)
Sodium: 142 meq/L (ref 135–145)
Total Bilirubin: 1.1 mg/dL (ref 0.2–1.2)
Total Protein: 7.2 g/dL (ref 6.0–8.3)

## 2023-08-02 LAB — LIPID PANEL
Cholesterol: 187 mg/dL (ref 0–200)
HDL: 82.3 mg/dL (ref >39.00)
LDL Cholesterol: 94 mg/dL (ref 0–99)
NonHDL: 104.93
Total CHOL/HDL Ratio: 2
Triglycerides: 57 mg/dL (ref 0.0–149.0)
VLDL: 11.4 mg/dL (ref 0.0–40.0)

## 2023-08-02 LAB — CBC WITH DIFFERENTIAL/PLATELET
Basophils Absolute: 0 10*3/uL (ref 0.0–0.1)
Basophils Relative: 0.6 % (ref 0.0–3.0)
Eosinophils Absolute: 0.1 10*3/uL (ref 0.0–0.7)
Eosinophils Relative: 2.5 % (ref 0.0–5.0)
HCT: 41.6 % (ref 36.0–46.0)
Hemoglobin: 13.9 g/dL (ref 12.0–15.0)
Lymphocytes Relative: 31.5 % (ref 12.0–46.0)
Lymphs Abs: 1.7 10*3/uL (ref 0.7–4.0)
MCHC: 33.4 g/dL (ref 30.0–36.0)
MCV: 100.3 fL — ABNORMAL HIGH (ref 78.0–100.0)
Monocytes Absolute: 0.5 10*3/uL (ref 0.1–1.0)
Monocytes Relative: 8.7 % (ref 3.0–12.0)
Neutro Abs: 3.1 10*3/uL (ref 1.4–7.7)
Neutrophils Relative %: 56.7 % (ref 43.0–77.0)
Platelets: 219 10*3/uL (ref 150.0–400.0)
RBC: 4.14 Mil/uL (ref 3.87–5.11)
RDW: 13.2 % (ref 11.5–15.5)
WBC: 5.5 10*3/uL (ref 4.0–10.5)

## 2023-08-02 LAB — VITAMIN D 25 HYDROXY (VIT D DEFICIENCY, FRACTURES): VITD: 31.66 ng/mL (ref 30.00–100.00)

## 2023-08-02 LAB — TSH: TSH: 2.6 u[IU]/mL (ref 0.35–5.50)

## 2023-08-02 NOTE — Addendum Note (Signed)
Addended by: Jarvis Morgan D on: 08/02/2023 07:23 AM   Modules accepted: Orders

## 2023-09-04 ENCOUNTER — Encounter: Payer: Self-pay | Admitting: Family Medicine

## 2023-09-04 ENCOUNTER — Telehealth: Payer: Medicare Other | Admitting: Family Medicine

## 2023-09-04 DIAGNOSIS — R0981 Nasal congestion: Secondary | ICD-10-CM | POA: Diagnosis not present

## 2023-09-04 MED ORDER — AMOXICILLIN-POT CLAVULANATE 875-125 MG PO TABS
1.0000 | ORAL_TABLET | Freq: Two times a day (BID) | ORAL | 0 refills | Status: DC
Start: 2023-09-04 — End: 2023-12-20

## 2023-09-04 NOTE — Patient Instructions (Signed)
-  I sent the medication(s) we discussed to your pharmacy: Meds ordered this encounter  Medications   amoxicillin-clavulanate (AUGMENTIN) 875-125 MG tablet    Sig: Take 1 tablet by mouth 2 (two) times daily.    Dispense:  14 tablet    Refill:  0     I hope you are feeling better soon!  Seek in person care promptly if your symptoms worsen, new concerns arise or you are not improving with treatment.  It was nice to meet you today. I help [facility] out with telemedicine visits on Tuesdays and Thursdays and am happy to help if you need a virtual follow up visit on those days. Otherwise, if you have any concerns or questions following this visit please schedule a follow up visit with your Primary Care office or seek care at a local urgent care clinic to avoid delays in care. If you are having severe or life threatening symptoms please call 911 and/or go to the nearest emergency room.

## 2023-09-04 NOTE — Progress Notes (Signed)
"  Patient was unable to self-report due to a lack of equipment at home via telehealth"

## 2023-09-04 NOTE — Progress Notes (Signed)
Virtual Visit via Video Note  I connected with Kelly [name]  on 09/04/23 at  4:20 PM EST by a video enabled telemedicine application and verified that I am speaking with the correct person using two identifiers.  Location patient: [place] Location provider:work or home office Persons participating in the virtual visit: patient, provider  I discussed the limitations and requested verbal permission for telemedicine visit. The patient expressed understanding and agreed to proceed.   HPI:  Acute telemedicine visit for sinus issues: -Onset: over 1 week ago -Symptoms include: nasal congestion, sinus congestion, was getting better - but then started to worsen with maxillary sinus discomfort, thick nasal congestion with some nose bleed today, some maxillary sinus discomfort R, headache, chills -has had sinus infection remotely -Denies: fever, CP, SOB, NVD, body aches -Has tried: saline, tylenol -negative home covid and flu tests - x2 48 hours apart -Pertinent past medical history: see below -Pertinent medication allergies: Allergies  Allergen Reactions   Estradiol     Other reaction(s): pancreatitis   Pantoprazole Sodium Diarrhea   Pollen Extract    Prednisolone Other (See Comments)    pancreatitis  -COVID-19 vaccine status:  Immunization History  Administered Date(s) Administered   DTaP 01/31/2019   Fluad Quad(high Dose 65+) 06/27/2021, 05/29/2023   Hepatitis A, Adult 06/26/1995, 08/19/1995   Influenza Split 07/18/2011   Influenza-Unspecified 08/06/2013, 06/25/2015, 06/23/2022   Meningococcal polysaccharide vaccine (MPSV4) 11/09/1995   OPV 10/19/1974   PFIZER(Purple Top)SARS-COV-2 Vaccination 09/15/2019, 10/09/2019, 06/21/2020, 07/13/2021   Pfizer Covid-19 Vaccine Bivalent Booster 17yrs & up 07/06/2022   Pfizer(Comirnaty)Fall Seasonal Vaccine 12 years and older 06/23/2022, 05/31/2023, 05/31/2023, 05/31/2023   Pneumococcal Conjugate-13 04/07/2019   Pneumococcal Polysaccharide-23 05/04/2020    Tdap 09/18/2009, 01/31/2019   Yellow Fever 02/26/1995     ROS: See pertinent positives and negatives per HPI.  Past Medical History:  Diagnosis Date   Claustrophobia    Pancreatitis 2009, 2005   Shingles    Shingles outbreak 2006    Past Surgical History:  Procedure Laterality Date   BREAST BIOPSY     Nml per pt , f/u mammo 10/2011   CHOLECYSTECTOMY     COLONOSCOPY WITH PROPOFOL N/A 08/30/2017   Procedure: COLONOSCOPY WITH PROPOFOL;  Surgeon: Christena Deem, MD;  Location: [facility] ENDOSCOPY;  Service: Endoscopy;  Laterality: N/A;   EXPLORATORY LAPAROTOMY     TRIGGER FINGER RELEASE     VAGINAL DELIVERY  09/18/1992     Current Outpatient Medications:    acetaminophen (TYLENOL) 650 MG CR tablet, Take 650 mg by mouth as needed., Disp: , Rfl:    amoxicillin-clavulanate (AUGMENTIN) 875-125 MG tablet, Take 1 tablet by mouth 2 (two) times daily., Disp: 14 tablet, Rfl: 0   cholecalciferol (VITAMIN D) 1000 UNITS tablet, Take 1,000 Units by mouth daily., Disp: , Rfl:    rosuvastatin (CRESTOR) 5 MG tablet, Take 1 tablet (5 mg total) by mouth daily., Disp: 90 tablet, Rfl: 3  EXAM:  VITALS per patient if applicable:  GENERAL: alert, oriented, appears well and in no acute distress  HEENT: atraumatic, conjunttiva clear, no obvious abnormalities on inspection of external nose and ears  NECK: normal movements of the head and neck  LUNGS: on inspection no signs of respiratory distress, breathing rate appears normal, no obvious gross SOB, gasping or wheezing  CV: no obvious cyanosis  MS: moves all visible extremities without noticeable abnormality  PSYCH/NEURO: pleasant and cooperative, no obvious depression or anxiety, speech and thought processing grossly intact  ASSESSMENT AND PLAN:  Discussed  the following assessment and plan:  Nasal sinus congestion  -we discussed possible serious and likely etiologies, options for evaluation and workup, limitations of telemedicine visit  vs in person visit, treatment, treatment risks and precautions. Pt is agreeable to treatment via telemedicine at this moment. She is wanting an rx for an abx as is traveling to [place] tomorrow. Discussed risks/alts and sent Rx for Augmentin as does seem reasonable given hx. Also discussed management of nose bleeds in case any further issues with that.  Advised to seek prompt virtual visit or in person care if worsening, new symptoms arise, or if is not improving with treatment as expected per our conversation of expected course. Discussed options for follow up care. Did let this patient know that I do telemedicine on Tuesdays and Thursdays for [place] and those are the days I am logged into the system. Advised to schedule follow up visit with PCP, [place] virtual visits or [facility] if any further questions or concerns to avoid delays in care.   I discussed the assessment and treatment plan with the patient. The patient was provided an opportunity to ask questions and all were answered. The patient agreed with the plan and demonstrated an understanding of the instructions.     Terressa Koyanagi, DO

## 2023-10-30 ENCOUNTER — Encounter: Payer: Self-pay | Admitting: Family

## 2023-10-30 LAB — HM MAMMOGRAPHY

## 2023-11-28 ENCOUNTER — Telehealth: Payer: Self-pay | Admitting: Family

## 2023-11-28 NOTE — Telephone Encounter (Signed)
 LVM to call back to triage pt per Claris Che and Darrick Huntsman. Schedule appt  Spoke with Dr Darrick Huntsman this morning   Please triage for palpitations,dizziness, SOB, cp, syncope.   Heart palpitations prolonged? Or last couple of seconds? Any trigger for palpations? Caffeine? Exercise?   Would she be okay with coming in for EKG? Okay to see me or another provider but recommend being seem promptly.    As long as stable would recommend consideration for propranolol 10 mg bid, updating echocardiogram   Reviewed dr end's note 08/31/22  Echocardiogram 08/21/2019 left ventricular ejection fraction 60 to 65%.  Mild to moderate tricuspid regurgitation.      BP Readings from Last 3 Encounters:  07/31/23 110/78  03/09/23 102/60  02/01/23 106/68

## 2023-11-28 NOTE — Telephone Encounter (Signed)
 Call pt  Spoke with Dr Darrick Huntsman this morning  Please triage for palpitations,dizziness, SOB, cp, syncope.  Heart palpitations prolonged? Or last couple of seconds? Any trigger for palpations? Caffeine? Exercise?  Would she be okay with coming in for EKG? Okay to see me or another provider but recommend being seem promptly.   As long as stable would recommend consideration for propranolol 10 mg bid, updating echocardiogram  Reviewed dr end's note 08/31/22  Echocardiogram 08/21/2019 left ventricular ejection fraction 60 to 65%.  Mild to moderate tricuspid regurgitation.  BP Readings from Last 3 Encounters:  07/31/23 110/78  03/09/23 102/60  02/01/23 106/68

## 2023-11-29 NOTE — Telephone Encounter (Signed)
 LVM to call back to triage pt per Claris Che and Darrick Huntsman. Schedule appt. Please Triage and schedule when pt calls back.    Spoke with Dr Darrick Huntsman on 11/28/23   Please triage for palpitations,dizziness, SOB, cp, syncope.   Heart palpitations prolonged? Or last couple of seconds? Any trigger for palpations? Caffeine? Exercise?   Would she be okay with coming in for EKG? Okay to see me or another provider but recommend being seem promptly.    As long as stable would recommend consideration for propranolol 10 mg bid, updating echocardiogram   Reviewed dr end's note 08/31/22  Echocardiogram 08/21/2019 left ventricular ejection fraction 60 to 65%.  Mild to moderate tricuspid regurgitation.        BP Readings from Last 3 Encounters:  07/31/23 110/78  03/09/23 102/60  02/01/23 106/68

## 2023-11-30 NOTE — Telephone Encounter (Signed)
 LVM to call back to triage pt per Claris Che and Darrick Huntsman. Schedule appt. Please Triage and schedule when pt calls back.    Spoke with Dr Darrick Huntsman on 11/28/23   Please triage for palpitations,dizziness, SOB, cp, syncope.   Heart palpitations prolonged? Or last couple of seconds? Any trigger for palpations? Caffeine? Exercise?   Would she be okay with coming in for EKG? Okay to see me or another provider but recommend being seem promptly.    As long as stable would recommend consideration for propranolol 10 mg bid, updating echocardiogram   Reviewed dr end's note 08/31/22  Echocardiogram 08/21/2019 left ventricular ejection fraction 60 to 65%.  Mild to moderate tricuspid regurgitation.        BP Readings from Last 3 Encounters:  07/31/23 110/78  03/09/23 102/60  02/01/23 106/68

## 2023-11-30 NOTE — Telephone Encounter (Signed)
 Patient returned call to the office. She stated that the palpitation episodes are infrequent (months go by in between episodes).   The last time she had heart palpitations was on Monday morning and it lasted 4 hours. This episode occurred after doing cardio. She also suspects that she was a little dehydrated because she did not drink any water that morning. She denies having caffeine. She also stated she did not sleep well the night before. She has not had any further palpitation episodes.  Patient denies dizziness, SOB, chest pain, syncope. Patient feels stable at this time.  This RN informed patient the provider recommended having an EKG promptly and consider Propranolol 10 mg BID. Patient stated her she is okay with plan but her cardiologist had mentioned starting Metoprolol. Patient also stating she is getting over illness. She is having some postnasal drainage and hoarseness in her voice. She is hesitant to come to the office today and asking if it is okay to come next week. Patient is aware that a message will be sent to get the provider's recommendations.

## 2023-11-30 NOTE — Telephone Encounter (Signed)
 NOTED

## 2023-12-03 NOTE — Telephone Encounter (Signed)
 LVM  to call back to schedule ov with Claris Che, please schedule if pt calls back

## 2023-12-03 NOTE — Telephone Encounter (Signed)
 Call pt I dont see OV scheduled  Please sch

## 2023-12-05 NOTE — Telephone Encounter (Signed)
 Spoke to pt  she stated that she is feeling a lot better so I  scheduled ov for 12/10/23

## 2023-12-06 ENCOUNTER — Ambulatory Visit: Payer: Self-pay

## 2023-12-06 ENCOUNTER — Ambulatory Visit: Attending: Cardiology

## 2023-12-06 ENCOUNTER — Telehealth: Payer: Self-pay | Admitting: Internal Medicine

## 2023-12-06 VITALS — BP 104/60 | HR 84 | Ht 63.0 in | Wt 137.8 lb

## 2023-12-06 DIAGNOSIS — I471 Supraventricular tachycardia, unspecified: Secondary | ICD-10-CM

## 2023-12-06 NOTE — Telephone Encounter (Signed)
 Patient c/o Palpitations:  STAT if patient reporting lightheadedness, shortness of breath, or chest pain  How long have you had palpitations/irregular HR/ Afib? Are you having the symptoms now? Since last Monday, yes  Are you currently experiencing lightheadedness, SOB or CP? no  Do you have a history of afib (atrial fibrillation) or irregular heart rhythm? yes  Have you checked your BP or HR? (document readings if available): 144, 160 HR  Are you experiencing any other symptoms? no

## 2023-12-06 NOTE — Progress Notes (Signed)
   Nurse Visit   Date of Encounter: 12/06/2023 ID: ADDY MCMANNIS, DOB [birth year], MRN [id number]  PCP:  Allegra Grana, FNP   [facility] Providers Cardiologist:  Yvonne Kendall, MD      Visit Details   VS:  Pulse 84   Ht 5\' 3"  (1.6 m)   Wt 137 lb 12.8 oz (62.5 kg)   SpO2 95%   BMI 24.41 kg/m  , BMI Body mass index is 24.41 kg/m.  Wt Readings from Last 3 Encounters:  12/06/23 137 lb 12.8 oz (62.5 kg)  08/01/23 140 lb (63.5 kg)  07/31/23 141 lb (64 kg)     Reason for visit: Palpations/elevated HR Performed today: Vitals, EKG, Provider consulted:Ryan Dunn, PA, and Education Changes (medications, testing, etc.) : No medication changes. Recommended pt schedule an appointment and purchase a Karida mobile  Length of Visit: 10 minutes    Medications Adjustments/Labs and Tests Ordered: Orders Placed This Encounter  Procedures   EKG 12-Lead   No orders of the defined types were placed in this encounter.    Signed, Parke Poisson, RN  12/06/2023 2:04 PM

## 2023-12-06 NOTE — Telephone Encounter (Signed)
 The patient called to report that she has been experiencing an elevated heart rate and palpitations since 9 a.m. this morning. She stated her heart rate has remained above 140 and has reached as high as 160. Her blood pressure is 92/70. The patient mentioned having a similar episode last week that lasted 3 hours. She noted she has tried coughing and bearing down without any resolution. The patient also mentioned that past symptoms have been triggered by dehydration, but she reported that she has been drinking plenty of fluids.  Ward Givens, NP made aware and recommended pt come into the office for and EKG. Pt made aware.

## 2023-12-06 NOTE — Telephone Encounter (Signed)
 Seen for EKG today at [facility]

## 2023-12-06 NOTE — Telephone Encounter (Addendum)
 Copied from CRM [id number]. Topic: Clinical - Red Word Triage >> Dec 06, 2023 11:39 AM Isabell A wrote: Red Word that prompted transfer to Nurse Triage: Patient experiencing heart palpitations between 144-151.     Chief Complaint: Palpitations Symptoms: heart palpitations, elevated HR Frequency: Constant since 9:00 am Pertinent Negatives: Patient denies SOB, chest pain Disposition: [x] ED /[] Urgent Care (no appt availability in office) / [] Appointment(In office/virtual)/ []  [facility] [place] [facility]/ [] Home Care/ [] Refused Recommended Disposition /[] [facility]/ []  Follow-up with PCP Additional Notes: Patient stated she has been having constant heart palpitations since around 9:00 am this morning. Her HR this morning has been ranging between 144-160. Five minutes prior to this phone call, patients heart rate was 151 and her BP was 90/70. Patient also feels shaky. Patient is unsure what might have triggered it. She denies doing any physical activity. She has been at rest. She has tried drinking water but the palpitations are not going away.  Advised patient to be seen in ED now. Patient verbalized understanding. She stated that her spouse will bring her.   Reason for Disposition  [1] Heart beating very rapidly (e.g., > 140 / minute) AND [2] present now  (Exception: During exercise.)  Answer Assessment - Initial Assessment Questions 1. DESCRIPTION: "Please describe your heart rate or heartbeat that you are having" (e.g., fast/slow, regular/irregular, skipped or extra beats, "palpitations")     Palpitations  2. ONSET: "When did it start?" (Minutes, hours or days)      Since 9:00 am this morning and it has been constant  3. DURATION: "How long does it last" (e.g., seconds, minutes, hours)     It has been at least a couple hours since it started  4. PATTERN "Does it come and go, or has it been constant since it started?"  "Does it get worse with exertion?"   "Are you feeling it now?"      Constant  5. HEART RATE: "Can you tell me your heart rate?" "How many beats in 15 seconds?"  (Note: not all patients can do this)       HR has been ranging from 144-160 this morning. 5 minute ago, patient's HR was 151 and her BP was 90/70.  6. RECURRENT SYMPTOM: "Have you ever had this before?" If Yes, ask: "When was the last time?" and "What happened that time?"      Patient stated she has had palpitations before. She stated the last occurrence was last Monday and prior to that, she did not have any episodes in a year.  7. CAUSE: "What do you think is causing the palpitations?" Unknown. Patient stated she had 4 oz coffee. It is not out of the norm for her to drink coffee. She denies any physical activity.      8. OTHER SYMPTOMS: "Do you have any other symptoms?" (e.g., dizziness, chest pain, sweating, difficulty breathing)       Body is kind of shaky  Protocols used: Heart Rate and Heartbeat Questions-A-AH

## 2023-12-10 ENCOUNTER — Ambulatory Visit (INDEPENDENT_AMBULATORY_CARE_PROVIDER_SITE_OTHER): Admitting: Family

## 2023-12-10 ENCOUNTER — Encounter: Payer: Self-pay | Admitting: Family

## 2023-12-10 VITALS — BP 118/78 | HR 78 | Temp 97.9°F | Ht 63.0 in | Wt 138.6 lb

## 2023-12-10 DIAGNOSIS — I471 Supraventricular tachycardia, unspecified: Secondary | ICD-10-CM

## 2023-12-10 NOTE — Progress Notes (Unsigned)
   Assessment & Plan:  There are no diagnoses linked to this encounter.   Return precautions given.   Risks, benefits, and alternatives of the medications and treatment plan prescribed today were discussed, and patient expressed understanding.   Education regarding symptom management and diagnosis given to patient on AVS either electronically or printed.  No follow-ups on file.  Rennie Plowman, FNP  Subjective:    Patient ID: Kelly [name], female    DOB: January 01, 1956, 68 y.o.   MRN: [id number]  CC: Kelly [name] is a 68 y.o. female who presents today for follow up.   HPI: Palpitations 5 days ago.   She has cough last week, PND.  She had taken mucinex DM night before  She had 4oz coffee, she walked her dog. She sat down and felt palpiatoins. She had HR 120. A few minutes late HR 160 sitting for 3 hours.   She had an episode while working out at [employer] 2 weeks ago. She had decaf coffee that night before. She was on the elliptical when HR increased.       In the past reports discussing starting metoprolol   EKG obtained 12/06/2023 No medication changes at that time.  Recommended purchase Karida mobile  2  day monitor 10/0/2023.Predominantly sinus rhythm with rare PAC's and PVC's, as well as a few brief episodes of PSVT. No sustained arrhythmia.  Triggered event corresponded with SR.   Allergies: Estradiol, Pantoprazole sodium, Pollen extract, and Prednisolone Current Outpatient Medications on File Prior to Visit  Medication Sig Dispense Refill   acetaminophen (TYLENOL) 650 MG CR tablet Take 650 mg by mouth as needed.     amoxicillin-clavulanate (AUGMENTIN) 875-125 MG tablet Take 1 tablet by mouth 2 (two) times daily. 14 tablet 0   cholecalciferol (VITAMIN D) 1000 UNITS tablet Take 1,000 Units by mouth daily.     Multiple Vitamins-Minerals (ICAPS AREDS 2 PO)      rosuvastatin (CRESTOR) 5 MG tablet Take 1 tablet (5 mg total) by mouth daily. 90 tablet 3   No  current facility-administered medications on file prior to visit.    Review of Systems    Objective:    BP 118/78   Pulse 78   Temp 97.9 F (36.6 C) (Oral)   Ht 5\' 3"  (1.6 m)   Wt 138 lb 9.6 oz (62.9 kg)   SpO2 95%   BMI 24.55 kg/m  BP Readings from Last 3 Encounters:  12/10/23 118/78  12/06/23 104/60  07/31/23 110/78   Wt Readings from Last 3 Encounters:  12/10/23 138 lb 9.6 oz (62.9 kg)  12/06/23 137 lb 12.8 oz (62.5 kg)  08/01/23 140 lb (63.5 kg)    Physical Exam

## 2023-12-12 NOTE — Patient Instructions (Signed)
 As discussed, please consider referral to electrophysiology and also sleep study.  Let me know how your appointment goes with cardiology.

## 2023-12-12 NOTE — Assessment & Plan Note (Signed)
 Asymptomatic today.  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  Discussed consideration for longer ZIO monitor due to infrequent nature of episodes.  Advised EP referral. Offered metoprolol and patient politely declines.  She plans to discuss at upcoming cardiology appointment.Consider OSA testing.

## 2023-12-20 ENCOUNTER — Ambulatory Visit: Attending: Cardiology | Admitting: Cardiology

## 2023-12-20 ENCOUNTER — Encounter: Payer: Self-pay | Admitting: Cardiology

## 2023-12-20 VITALS — BP 106/72 | HR 67 | Ht 63.0 in | Wt 137.6 lb

## 2023-12-20 DIAGNOSIS — R002 Palpitations: Secondary | ICD-10-CM | POA: Insufficient documentation

## 2023-12-20 DIAGNOSIS — I493 Ventricular premature depolarization: Secondary | ICD-10-CM | POA: Diagnosis not present

## 2023-12-20 DIAGNOSIS — E782 Mixed hyperlipidemia: Secondary | ICD-10-CM | POA: Diagnosis present

## 2023-12-20 DIAGNOSIS — I471 Supraventricular tachycardia, unspecified: Secondary | ICD-10-CM | POA: Diagnosis not present

## 2023-12-20 DIAGNOSIS — I700 Atherosclerosis of aorta: Secondary | ICD-10-CM | POA: Diagnosis present

## 2023-12-20 DIAGNOSIS — I491 Atrial premature depolarization: Secondary | ICD-10-CM | POA: Diagnosis not present

## 2023-12-20 LAB — HM PAP SMEAR

## 2023-12-20 MED ORDER — METOPROLOL TARTRATE 25 MG PO TABS: 12.5000 mg | ORAL_TABLET | Freq: Every day | ORAL | 3 refills | Status: AC | PRN

## 2023-12-20 NOTE — Progress Notes (Signed)
 Cardiology Office Note:  .   Date:  12/20/2023  ID:  Kelly [name], DOB 03-Dec-1955, MRN [id number] PCP: Allegra Grana, FNP  [facility] Providers Cardiologist:  Yvonne Kendall, MD    History of Present Illness: .   Kelly [name] is a 68 y.o. female with a past medical history of chronic small pericardial effusion, frequent PVCs, pancreatitis, shingles, claustrophobia, who follows up today for history of palpitations.   Echocardiogram in 08/21/19/2020 revealed LVEF of 60 to 65% with G2 DD, mild to moderate tricuspid regurgitation, and a small pericardial effusion. 12/31 2020 she did wear a long-term monitor that confirmed frequent PVCs and PVC burden approximately 15% she was subsequently scheduled for stress test at that time. Myoview Lexiscan on 10/31/2019 revealed normal wall motion EF estimated at 69%, PVCs noted at rest and during infusion, low risk scan.    She was last seen in clinic 03/09/2023 by Dr. Okey Dupre.  At that time she was doing well without any further palpitations since her last visit.  She was tolerating rosuvastatin well.  She did note some soreness in her chest wall after doing upper body exercises but otherwise denied any chest pain.  No medication changes were made and no further testing was ordered.  She returns to clinic today with continued issues with palpitations.  She denies any chest pain, shortness of breath, peripheral edema, lightheadedness this or dizziness. she started to have issues starting on 3/10. She stated that she didn't sleep as well and after arriving at the gym heart rates were elevated at 120 bpm. On 3/20 she was up drinking around 4 ounces of decaf coffee. Heart rate was elevated at around 150 bpm.  She states that her palpitations and elevated heart rates is slightly decreased with her decreasing her caffeine.  She has also purchased a Kardia device and has been told that is currently sinus rhythm.  She also stated that she had had upper  respiratory like symptoms and was taking Mucinex DM which likely could have sparked her symptoms.  She stated that previously she was told maybe she needed as needed medication to take and is requesting that today.  She recently followed up with their PCP.  She was concerned as there can be today contributed by [place] [employer] in [place] and wanted to make sure that there was nothing that was found on her EKG that was abnormal prior to leaving the country.  She denies any hospitalizations or visits to the emergency department  ROS: 10 point review of systems has been reviewed and considered negative except ones been listed in HPI  Studies Reviewed: Marland Kitchen   EKG Interpretation Date/Time:  Thursday December 20 2023 11:09:23 EDT Ventricular Rate:  67 PR Interval:  148 QRS Duration:  78 QT Interval:  388 QTC Calculation: 409 R Axis:   45  Text Interpretation: Normal sinus rhythm Normal ECG When compared with ECG of 06-Dec-2023 13:15, No significant change was found Confirmed by Charlsie Quest ([id number]) on 12/20/2023 11:11:55 AM    EP Procedures and Devices: 14-day event monitor (04/28/2022): Predominantly sinus rhythm with rare PACs and PVCs as well as a few brief episodes of PSVT.  No sustained arrhythmia observed. 3-day event monitor (08/27/2019): Predominantly sinus rhythm with rare PACs and frequent PVCs (PVC burden 14.5%).  Single episode of brief PSVT also noted. 24-hour Holter monitor (11/13/2016): Rare PACs and PVCs.  Single 3 beat atrial run.  No sustained arrhythmia or prolonged pause.   Non-Invasive Evaluation(s):  Pharmacologic MPI (10/31/2019): Low risk study without ischemia or scar.  No coronary artery calcification.  LVEF 69%.  PVCs noted during study. TTE (08/21/2019): Normal LV size and wall thickness.  LVEF 60-65% with grade 2 diastolic dysfunction.  Normal RV size and function.  Mild left atrial enlargement.  Mild to moderate tricuspid regurgitation.  Upper normal PA pressure.  Mildly elevated CVP.   Small pericardial effusion. Limited TTE (04/03/2017): Upper normal LV size with normal wall thickness.  LVEF 60-65% with normal wall motion.  Small pericardial effusion, stable to minimally increased in size from 10/2016. TTE (11/15/2016): Normal LV size with upper normal wall thickness.  LVEF 55-60% with normal wall motion and grade 1 diastolic dysfunction.  Mild aortic and tricuspid regurgitation.  Normal RV size and function dilated IVC.  Trivial pericardial effusion. Risk Assessment/Calculations:             Physical Exam:   VS:  BP 106/72   Pulse 67   Ht 5\' 3"  (1.6 m)   Wt 137 lb 9.6 oz (62.4 kg)   SpO2 95%   BMI 24.37 kg/m    Wt Readings from Last 3 Encounters:  12/20/23 137 lb 9.6 oz (62.4 kg)  12/10/23 138 lb 9.6 oz (62.9 kg)  12/06/23 137 lb 12.8 oz (62.5 kg)    GEN: Well nourished, well developed in no acute distress NECK: No JVD; No carotid bruits CARDIAC: RRR, no murmurs, rubs, gallops RESPIRATORY:  Clear to auscultation without rales, wheezing or rhonchi  ABDOMEN: Soft, non-tender, non-distended EXTREMITIES:  No edema; No deformity   ASSESSMENT AND PLAN: .   Palpitations/PSVT/PACs/PVCs she recently had worsening palpitations with elevated heart rates up to 150s 160s.  She noticed that she was taking Mucinex for upper respiratory infection as well as any increased her caffeine.  The palpitations are slightly decreased with decreasing her caffeine.  She has been encouraged to continue to monitor her rhythm with her Lourena Simmonds mobile.  She has started on metoprolol titrate 12.5 mg daily as needed for palpitations.  With decrease in her symptoms with decreasing caffeine intake will defer referral to EP at this time.  Hyperlipidemia with last LDL 96.  Has improved.  She has been continued on rosuvastatin 5 mg daily.  This continues to be refilled and managed by her PCP.  Aortic atherosclerosis without any chest discomfort or anginal equivalents.  EKG today reveals sinus rhythm with a  rate of 67.  She is continued on statin medication of rosuvastatin 5 mg daily.  No further ischemic testing is warranted at this time.       Dispo: Patient return to clinic to see MD/APP in 6 months or sooner if needed for reevaluation of symptoms  Signed, Ernestina Joe, NP

## 2023-12-20 NOTE — Patient Instructions (Signed)
 Medication Instructions:  Metoprolol Tartrate 12.5 mg (half of the 25 mg tablet) daily as needed for palpitations  *If you need a refill on your cardiac medications before your next appointment, please call your pharmacy*  Lab Work: None ordered If you have labs (blood work) drawn today and your tests are completely normal, you will receive your results only by: MyChart Message (if you have MyChart) OR A paper copy in the mail If you have any lab test that is abnormal or we need to change your treatment, we will call you to review the results.  Testing/Procedures: None ordered  Follow-Up: At [facility], you and your health needs are our priority.  As part of our continuing mission to provide you with exceptional heart care, our providers are all part of one team.  This team includes your primary Cardiologist (physician) and Advanced Practice Providers or APPs (Physician Assistants and Nurse Practitioners) who all work together to provide you with the care you need, when you need it.  Your next appointment:   6 month(s)  Provider:   You may see Yvonne Kendall, MD or one of the following Advanced Practice Providers on your designated Care Team:   Charlsie Quest, NP  We recommend signing up for the patient portal called "MyChart".  Sign up information is provided on this After Visit Summary.  MyChart is used to connect with patients for Virtual Visits (Telemedicine).  Patients are able to view lab/test results, encounter notes, upcoming appointments, etc.  Non-urgent messages can be sent to your provider as well.   To learn more about what you can do with MyChart, go to [email or web address].

## 2024-04-14 ENCOUNTER — Telehealth: Payer: Self-pay | Admitting: Cardiology

## 2024-04-14 NOTE — Telephone Encounter (Signed)
 Pt called concern about a cardiac event that happened over the weekend; pt states that her and her husband had traveled from [facility] to their home in [place]; stated they made frequent stops; states on 7/26 she arrived home around 1130p and she started to feel horrible; her HR was 140; she had been recently prescribed a PRN Metoprolol  12.5 mg and she took a half of 25 mg tablet and laid down; states approximately an hour later her HR remained at 140 bpm and she felt as if she had heartburn and took a Pepcid; another hour later she continued to feel horrible; she decided it may be because she needed something to eat, but when she was walking to her kitchen she laid down on the floor because she felt as if she was going to pass out; her husband helped her to the chair; her heart rate was down to 128 and BP was 89/59 and she still felt uncomfortable in the chest; she eventually fell asleep after eating a bagel with peanut butter; pt states that she would like an appointment to discuss another option other than Metoprolol ; she states she never wants to take that medication again because it dropped her BP; I explained that an increase in HR could also be the cause of low BP;  she stated that she hasn't had any further episodes and was able to go to gym and work out this AM; I scheduled her an appointment with Tylene Lunch, NP on 04/17/24 @ 1055 am; I advised the patient that should she have another episode prior to her appointment (or if it happens again) to call 911 or have someone drive her to ED; pt verbalized understanding

## 2024-04-14 NOTE — Telephone Encounter (Signed)
 Pt c/o medication issue:  1. Name of Medication: metoprolol  tartrate (LOPRESSOR ) 25 MG tablet (Expired)   2. How are you currently taking this medication (dosage and times per day)? Stopped taking  3. Are you having a reaction (difficulty breathing--STAT)? No   4. What is your medication issue? Hypotension, weak, felt like she was going to pass out, heart racing

## 2024-04-17 ENCOUNTER — Encounter: Payer: Self-pay | Admitting: Cardiology

## 2024-04-17 ENCOUNTER — Ambulatory Visit: Attending: Cardiology | Admitting: Cardiology

## 2024-04-17 ENCOUNTER — Other Ambulatory Visit: Payer: Self-pay

## 2024-04-17 VITALS — BP 100/64 | HR 63 | Ht 63.0 in | Wt 137.2 lb

## 2024-04-17 DIAGNOSIS — I493 Ventricular premature depolarization: Secondary | ICD-10-CM | POA: Insufficient documentation

## 2024-04-17 DIAGNOSIS — E782 Mixed hyperlipidemia: Secondary | ICD-10-CM | POA: Insufficient documentation

## 2024-04-17 DIAGNOSIS — I471 Supraventricular tachycardia, unspecified: Secondary | ICD-10-CM | POA: Insufficient documentation

## 2024-04-17 DIAGNOSIS — R072 Precordial pain: Secondary | ICD-10-CM | POA: Diagnosis present

## 2024-04-17 DIAGNOSIS — R002 Palpitations: Secondary | ICD-10-CM | POA: Insufficient documentation

## 2024-04-17 DIAGNOSIS — I700 Atherosclerosis of aorta: Secondary | ICD-10-CM | POA: Diagnosis present

## 2024-04-17 NOTE — Patient Instructions (Signed)
 Medication Instructions:  Your physician recommends that you continue on your current medications as directed. Please refer to the Current Medication list given to you today.   *If you need a refill on your cardiac medications before your next appointment, please call your pharmacy*  Lab Work: Your provider would like for you to have following labs drawn today BMP.   If you have labs (blood work) drawn today and your tests are completely normal, you will receive your results only by: MyChart Message (if you have MyChart) OR A paper copy in the mail If you have any lab test that is abnormal or we need to change your treatment, we will call you to review the results.  Testing/Procedures:  Patient will need an OPEN CT due to claustrophobia    Your cardiac CT will be scheduled at one of the below locations:   [facility] [address] [place], [place] [address] [phone]  OR   [facility] [address] [place], [place] [address] [phone]  OR   [facility] [address] [place], [place] [address] [phone]  OR   [facility] [address]  [place], [place] [address]  OR   [facility] [address] [place], [place]  If scheduled at [facility], please arrive at the [facility] and Children's Entrance (Entrance C2) of [facility] 30 minutes prior to test start time. You can use the FREE valet parking offered at entrance C (encouraged to control the heart rate for the test)  Proceed to the [facility] Radiology Department (first floor) to check-in and test prep.  All radiology patients and guests should use entrance C2 at [facility], accessed from [facility], even though the hospital's physical address listed is [address]  If scheduled at the Heart and Vascular Tower at [address], please enter the parking lot using the  [address] entrance and use the FREE valet service at the patient drop-off area. Enter the building and check-in with registration on the main floor.  If scheduled at [facility], please arrive to the Heart and Vascular Center 15 mins early for check-in and test prep.  There is spacious parking and easy access to the radiology department from the [facility] Heart and Vascular entrance. Please enter here and check-in with the desk attendant.   If scheduled at [facility], please arrive 30 minutes early for check-in and test prep.  Please follow these instructions carefully (unless otherwise directed):  An IV will be required for this test and Nitroglycerin will be given.  Hold all erectile dysfunction medications at least 3 days (72 hrs) prior to test. (Ie viagra, cialis, sildenafil, tadalafil, etc)   On the Night Before the Test: Be sure to Drink plenty of water. Do not consume any caffeinated/decaffeinated beverages or chocolate 12 hours prior to your test. Do not take any antihistamines 12 hours prior to your test. On the Day of the Test: Drink plenty of water until 1 hour prior to the test. Do not eat any food 1 hour prior to test. You may take your regular medications prior to the test.  Take metoprolol  (Lopressor ) two hours prior to test. If you take Furosemide/Hydrochlorothiazide/Spironolactone/Chlorthalidone, please HOLD on the morning of the test. Patients who wear a continuous glucose monitor MUST remove the device prior to scanning. FEMALES- please wear underwire-free bra if available, avoid dresses &  tight clothing       After the Test: Drink plenty of water. After receiving IV contrast, you may experience a mild flushed feeling. This is normal. On occasion, you may experience a mild rash up to 24 hours after the test. This is not dangerous. If this occurs, you can take Benadryl 25 mg, Zyrtec, Claritin, or Allegra and increase your fluid intake.  (Patients taking Tikosyn should avoid Benadryl, and may take Zyrtec, Claritin, or Allegra) If you experience trouble breathing, this can be serious. If it is severe call 911 IMMEDIATELY. If it is mild, please call our office.  We will call to schedule your test 2-4 weeks out understanding that some insurance companies will need an authorization prior to the service being performed.   For more information and frequently asked questions, please visit our website : [email or web address]  For non-scheduling related questions, please contact the cardiac imaging nurse navigator should you have any questions/concerns: Cardiac Imaging Nurse Navigators Direct Office Dial: [phone]   For scheduling needs, including cancellations and rescheduling, please call [place], [phone].   Follow-Up: At [facility], you and your health needs are our priority.  As part of our continuing mission to provide you with exceptional heart care, our providers are all part of one team.  This team includes your primary Cardiologist (physician) and Advanced Practice Providers or APPs (Physician Assistants and Nurse Practitioners) who all work together to provide you with the care you need, when you need it.  Your next appointment:   6 week(s)  Provider:   Lonni Hanson, MD or Tylene Lunch, NP

## 2024-04-17 NOTE — Progress Notes (Signed)
 Cardiology Office Note   Date:  04/17/2024  ID:  Kelly [name], DOB [birth year], MRN [id number] PCP: Dineen Rollene MATSU, FNP   HeartCare Providers Cardiologist:  Lonni Hanson, MD     History of Present Illness Kelly [name] is a 68 y.o. female with a past medical history of chronic small pleural effusion, frequent PVCs, PAC's, and PSVT, pancreatitis, shingles, aortic atherosclerosis, mixed hyperlipidemia, claustrophobia, palpitations, who is here today for follow-up.   Echocardiogram in 08/21/19/2020 revealed LVEF of 60 to 65% with G2 DD, mild to moderate tricuspid regurgitation, and a small pericardial effusion. 12/31 2020 she did wear a long-term monitor that confirmed frequent PVCs and PVC burden approximately 15% she was subsequently scheduled for stress test at that time. Myoview  Lexiscan  on 10/31/2019 revealed normal wall motion EF estimated at 69%, PVCs noted at rest and during infusion, low risk scan.   She was evaluated in clinic 03/09/2023 by Dr. Hanson.  At that time she was doing well without further palpitations since her last visit.  She been tolerating rosuvastatin  well.  She did note some soreness in her chest wall after doing upper body exercises but otherwise denied any chest discomfort.   She was last seen in clinic 12/20/2023 with continued issues with palpitations.  She denied any associated symptoms.  She noted elevated heart rates around 120 to 150 bpm.  That slightly decreased with decreased her caffeine.  She did also report just a cardia device and she was told that it currently was reading sinus rhythm.  She also stated that she had upper respiratory like symptoms and was taking Mucinex DM which would likely have sparked her symptoms.  She recently followed up with her PCP.  She was concerned about her upcoming trip and wanted to make sure nothing was found on her EKG that was abnormal prior to her leaving the country.  She was started on Toprol -XL 12.5 mg  daily as needed for palpitations.  With the decrease in her symptoms and decreasing caffeine intake EP referral was deferred at that time.  She returns to clinic today stating that when they returned from [place]  on 7/26 that she had a cardiac event.  She arrived around 11:30 PM and started to feel horrible.  She used her Crist mobile and felt that her heart rate was around 140s 150 bpm.  That stayed that way for about an hour to hour and a half and she took 12.5 mg of her metoprolol .  She laid down stated that she fell asleep for approximately 20 minutes she returned her heart rate and it was around 120-130.  She felt an uneasiness in her chest and epigastric discomfort so she took a Pepcid.  Another hour later she decided that she would get something to eat because she did not eat or hydrated well during the drive back from [place]  throughout the day.  She stated that she felt like she was going to pass out.  Her husband helped her to the chair.  Blood pressure was 89/59 and she still felt uncomfortable in the chest.  She eventually fell asleep after eating a bagel. Slept in the recliner throughout the night.  She states she had not had any further episodes and was able to go to the gym 04/14/2024.  With the symptoms she was she was concerned that she may be having a heart attack.  She continues to have some chest discomfort that is a heaviness or pressure on the left side of her  chest into her left shoulder and the top of her left arm to her elbow that would wrap around to her back just on the left side.  She denies any other associated symptoms of shortness of breath, peripheral edema, syncope or near syncope.  Denies any hospitalizations or visits to the emergency department.  She is unsure whether it was caused by her metoprolol  or elevated heart rate.  ROS: 10 point review of systems has been reviewed and considered negative except ones been listed in the HPI  Studies Reviewed EKG  Interpretation Date/Time:  Thursday April 17 2024 10:52:20 EDT Ventricular Rate:  63 PR Interval:  140 QRS Duration:  82 QT Interval:  412 QTC Calculation: 421 R Axis:   60  Text Interpretation: Normal sinus rhythm Normal ECG When compared with ECG of 20-Dec-2023 11:09, No significant change was found Confirmed by Gerard Frederick ([id number]) on 04/17/2024 10:53:59 AM    EP Procedures and Devices: 14-day event monitor (04/28/2022): Predominantly sinus rhythm with rare PACs and PVCs as well as a few brief episodes of PSVT.  No sustained arrhythmia observed. 3-day event monitor (08/27/2019): Predominantly sinus rhythm with rare PACs and frequent PVCs (PVC burden 14.5%).  Single episode of brief PSVT also noted. 24-hour Holter monitor (11/13/2016): Rare PACs and PVCs.  Single 3 beat atrial run.  No sustained arrhythmia or prolonged pause.   Non-Invasive Evaluation(s): Pharmacologic MPI (10/31/2019): Low risk study without ischemia or scar.  No coronary artery calcification.  LVEF 69%.  PVCs noted during study. TTE (08/21/2019): Normal LV size and wall thickness.  LVEF 60-65% with grade 2 diastolic dysfunction.  Normal RV size and function.  Mild left atrial enlargement.  Mild to moderate tricuspid regurgitation.  Upper normal PA pressure.  Mildly elevated CVP.  Small pericardial effusion. Limited TTE (04/03/2017): Upper normal LV size with normal wall thickness.  LVEF 60-65% with normal wall motion.  Small pericardial effusion, stable to minimally increased in size from 10/2016. TTE (11/15/2016): Normal LV size with upper normal wall thickness.  LVEF 55-60% with normal wall motion and grade 1 diastolic dysfunction.  Mild aortic and tricuspid regurgitation.  Normal RV size and function dilated IVC.  Trivial pericardial effusion. Risk Assessment/Calculations           Physical Exam VS:  BP 100/64 (BP Location: Left Arm, Patient Position: Sitting, Cuff Size: Normal)   Pulse 63   Ht 5' 3 (1.6 m)   Wt 137 lb  3.2 oz (62.2 kg)   SpO2 98%   BMI 24.30 kg/m        Wt Readings from Last 3 Encounters:  04/17/24 137 lb 3.2 oz (62.2 kg)  12/20/23 137 lb 9.6 oz (62.4 kg)  12/10/23 138 lb 9.6 oz (62.9 kg)    GEN: Well nourished, well developed in no acute distress NECK: No JVD; No carotid bruits CARDIAC: RRR, no murmurs, rubs, gallops RESPIRATORY:  Clear to auscultation without rales, wheezing or rhonchi  ABDOMEN: Soft, non-tender, non-distended EXTREMITIES:  No edema; No deformity   ASSESSMENT AND PLAN Palpitations/PSVT/PACs/PVCs with recently elevated heart rate up to the 150s.  She had tried to take her metoprolol  12.5 mg and felt horrible heart rate eventually dropped to around 120 but also dropped her blood pressure to 89.  She was not sure if the extended amount of time that she had elevated heart rate on her blood pressure or the metoprolol  or being dehydrated.  She did note that she was trying to drive back from [place]   and has not been hydrating on the way and had increased her caffeine to try to make it home without having this time.  She continue to monitor her rhythm on her cardia mobile.  Precordial pain after episode of tachycardia and hypotension.  Continues to have residual pain to the left side of her chest and her left shoulder.  No left arm rupture on her side.  She has been scheduled for coronary CTA to rule out ischemia.  She has been sent for updated BMP today to evaluate kidney function.  With previously concern for not been able to tolerate beta-blocker therapy as she was not given a dose of metoprolol  prior to her coronary CTA.  Hyperlipidemia with last LDL 96.  Continues to improve on rosuvastatin  5 mg daily.  She has upcoming labs with her PCP.  Aortic atherosclerosis with recent episodes of precordial pain.  EKG today reveals sinus rhythm at a rate of 63 with no acute ischemic changes noted.  She has been continued on rosuvastatin  5 mg daily.       Dispo: Patient to return to  clinic to see MD/APP in 6 weeks or sooner if needed  Signed, Donn Zanetti, NP

## 2024-04-18 LAB — BASIC METABOLIC PANEL WITH GFR
BUN/Creatinine Ratio: 34 — ABNORMAL HIGH (ref 12–28)
BUN: 24 mg/dL (ref 8–27)
CO2: 20 mmol/L (ref 20–29)
Calcium: 9.5 mg/dL (ref 8.7–10.3)
Chloride: 103 mmol/L (ref 96–106)
Creatinine, Ser: 0.71 mg/dL (ref 0.57–1.00)
Glucose: 77 mg/dL (ref 70–99)
Potassium: 4.3 mmol/L (ref 3.5–5.2)
Sodium: 143 mmol/L (ref 134–144)
eGFR: 91 mL/min/1.73 (ref >59)

## 2024-04-22 ENCOUNTER — Ambulatory Visit: Payer: Self-pay | Admitting: Cardiology

## 2024-04-22 ENCOUNTER — Encounter (HOSPITAL_COMMUNITY): Payer: Self-pay

## 2024-04-22 DIAGNOSIS — R072 Precordial pain: Secondary | ICD-10-CM

## 2024-04-22 NOTE — Progress Notes (Signed)
 Kidney function and electrolytes remain stable.  Recommend continuing current medication regimen without changes at this time.

## 2024-04-24 ENCOUNTER — Ambulatory Visit (HOSPITAL_COMMUNITY)

## 2024-04-25 ENCOUNTER — Ambulatory Visit (HOSPITAL_COMMUNITY)
Admission: RE | Admit: 2024-04-25 | Discharge: 2024-04-25 | Disposition: A | Discharge status: Home / Self Care | Source: Ambulatory Visit | Attending: Cardiology | Admitting: Cardiology

## 2024-04-25 DIAGNOSIS — R072 Precordial pain: Secondary | ICD-10-CM | POA: Insufficient documentation

## 2024-04-25 DIAGNOSIS — I3139 Other pericardial effusion (noninflammatory): Secondary | ICD-10-CM | POA: Diagnosis not present

## 2024-04-25 DIAGNOSIS — I2510 Atherosclerotic heart disease of native coronary artery without angina pectoris: Secondary | ICD-10-CM | POA: Diagnosis not present

## 2024-04-25 DIAGNOSIS — I281 Aneurysm of pulmonary artery: Secondary | ICD-10-CM | POA: Diagnosis not present

## 2024-04-25 MED ORDER — IOHEXOL 350 MG/ML SOLN
100.0000 mL | Freq: Once | INTRAVENOUS | Status: AC | PRN
  Administered 2024-04-25: 100 mL via INTRAVENOUS

## 2024-04-25 MED ORDER — NITROGLYCERIN 0.4 MG SL SUBL
0.8000 mg | SUBLINGUAL_TABLET | Freq: Once | SUBLINGUAL | Status: AC
  Administered 2024-04-25: 0.8 mg via SUBLINGUAL

## 2024-04-30 NOTE — Progress Notes (Signed)
 Coronary calcium  score of 12.2.  There is mild coronary artery disease estimated 25-49%.  Pericardial effusion or extra fluid is still noted.  Recommendation is to continue with aggressive risk factor modification and primary prevention strategies to prevent progression of the coronary artery disease with keeping LDL or bad cholesterol less than 70 but ideally less than 55 with statin medications and recommend starting aspirin 81 mg daily.  With continued pericardial effusion would recommend repeat echocardiogram for further evaluation.

## 2024-05-05 NOTE — Progress Notes (Signed)
 CT of the chest over read completed by radiology continue to show small pericardial effusion that is unchanged from prior studies.  No changes to current medication regimen or further testing needed at this time.

## 2024-06-02 ENCOUNTER — Ambulatory Visit: Admitting: Cardiology

## 2024-06-03 ENCOUNTER — Ambulatory Visit: Attending: Cardiology

## 2024-06-03 DIAGNOSIS — R072 Precordial pain: Secondary | ICD-10-CM | POA: Diagnosis present

## 2024-06-03 LAB — ECHOCARDIOGRAM COMPLETE
AR max vel: 1.9 cm2
AV Area VTI: 1.9 cm2
AV Area mean vel: 1.73 cm2
AV Mean grad: 7 mmHg
AV Peak grad: 12.4 mmHg
Ao pk vel: 1.76 m/s
Area-P 1/2: 3.74 cm2
MV VTI: 2.23 cm2
P 1/2 time: 517 ms
S' Lateral: 3.1 cm

## 2024-06-05 ENCOUNTER — Ambulatory Visit: Payer: Self-pay | Admitting: Cardiology

## 2024-06-06 ENCOUNTER — Encounter: Payer: Self-pay | Admitting: Cardiology

## 2024-06-06 ENCOUNTER — Ambulatory Visit: Attending: Cardiology | Admitting: Cardiology

## 2024-06-06 VITALS — BP 110/64 | HR 69 | Ht 63.0 in | Wt 137.0 lb

## 2024-06-06 DIAGNOSIS — I2510 Atherosclerotic heart disease of native coronary artery without angina pectoris: Secondary | ICD-10-CM | POA: Insufficient documentation

## 2024-06-06 DIAGNOSIS — R002 Palpitations: Secondary | ICD-10-CM | POA: Insufficient documentation

## 2024-06-06 DIAGNOSIS — I491 Atrial premature depolarization: Secondary | ICD-10-CM | POA: Diagnosis present

## 2024-06-06 DIAGNOSIS — E782 Mixed hyperlipidemia: Secondary | ICD-10-CM | POA: Insufficient documentation

## 2024-06-06 DIAGNOSIS — I493 Ventricular premature depolarization: Secondary | ICD-10-CM | POA: Insufficient documentation

## 2024-06-06 DIAGNOSIS — I471 Supraventricular tachycardia, unspecified: Secondary | ICD-10-CM | POA: Diagnosis present

## 2024-06-06 NOTE — Patient Instructions (Signed)
 Medication Instructions:   Your physician recommends that you continue on your current medications as directed. Please refer to the Current Medication list given to you today.   *If you need a refill on your cardiac medications before your next appointment, please call your pharmacy*  Lab Work:  No labs ordered today   If you have labs (blood work) drawn today and your tests are completely normal, you will receive your results only by: MyChart Message (if you have MyChart) OR A paper copy in the mail If you have any lab test that is abnormal or we need to change your treatment, we will call you to review the results.  Testing/Procedures:  No test ordered today   Follow-Up: At [facility], you and your health needs are our priority.  As part of our continuing mission to provide you with exceptional heart care, our providers are all part of one team.  This team includes your primary Cardiologist (physician) and Advanced Practice Providers or APPs (Physician Assistants and Nurse Practitioners) who all work together to provide you with the care you need, when you need it.  Your next appointment:    6 month(s)  Provider:    You may see Lonni Hanson, MD or one of the following Advanced Practice Providers on your designated Care Team:   Lonni Meager, NP Lesley Maffucci, PA-C Bernardino Bring, PA-C Cadence [place], PA-C Tylene Lunch, NP Barnie Hila, NP

## 2024-06-06 NOTE — Progress Notes (Signed)
 Cardiology Office Note   Date:  06/06/2024  ID:  Kelly [name], DOB 1956/05/22, MRN [id number] PCP: Dineen Rollene MATSU, FNP   [facility] Providers Cardiologist:  Lonni Hanson, MD     History of Present Illness Kelly [name] is a 68 y.o. female with a past medical history of chronic small pleural effusion, frequent PVCs, PACs, PSVT, pancreatitis, shingles, aortic atherosclerosis, mixed hyperlipidemia, claustrophobia, palpitations, is here today for follow-up.   Echocardiogram in 08/21/19/2020 revealed LVEF of 60 to 65% with G2 DD, mild to moderate tricuspid regurgitation, and a small pericardial effusion. 12/31 2020 she did wear a long-term monitor that confirmed frequent PVCs and PVC burden approximately 15% she was subsequently scheduled for stress test at that time. Myoview  Lexiscan  on 10/31/2019 revealed normal wall motion EF estimated at 69%, PVCs noted at rest and during infusion, low risk scan.   She was evaluated in clinic 03/09/2023 by Dr. Hanson.  At that time she was doing well without further palpitations since her last visit.  She been tolerating rosuvastatin  well.  She did note some soreness in her chest wall after doing upper body exercises but otherwise denied any chest discomfort.  She was evaluated in April 2025 with continued issues with palpitations.  She denies any associated symptoms.  Heart rates were elevated around 120 to 150 bpm.  That is slightly decreased with decreasing her caffeine intake.  She was concerned about her upcoming trip and wanted to make sure nothing was found on her EKG.  She was started on Toprol -XL 12.5 mg daily as needed for palpitations.  With the decrease in her symptoms with decreasing caffeine EP referral was deferred.   She was last seen in clinic 04/07/2024 stated that when they returned from [place]  she had had an episode and she had taken her as needed metoprolol  and dropped her blood pressure held.  She was having chest  discomfort or heaviness and pressure on left side of her chest into her left shoulder and the top of her left arm at her elbow that would wrap around to her back.  She was scheduled for coronary CTA which revealed mild coronary artery disease in the proximal second diagonal and 25 to 49% stenosis.  Otherwise minimal scattered plaque.  Echocardiogram revealed an LVEF of 55 to 60%, G2 DD, and mild mitral regurgitation mild to moderate tricuspid regurgitation.  She returns to clinic today stating overall for the cardiac perspective she has been doing well.  She had palpitations last week on 2 separate occasions for approximately an hour and a half apiece.  She is originally they were related to her caffeine intake.  She had taken some acid reflux medication and had hydrated with (water and her palpitations subsided.  She then had an episode when she had gotten out of bed in the morning repeated the same activity and the palpitations subsided.  She has had no Pain since last Thursday and has had no further episodes of palpitations.  She denies any other associated symptoms.  States since that she has been compliant with her current medication regimen.  Denies the use of any metoprolol  since it made her feel horrible the last time that she had taken it.  She denies any hospitalizations or visits to the emergency department.  ROS: 10 point review of symptoms has been reviewed and considered negative exception was been listed in the HPI  Studies Reviewed EKG Interpretation Date/Time:  Friday June 06 2024 13:39:36 EDT Ventricular Rate:  69  PR Interval:  136 QRS Duration:  86 QT Interval:  400 QTC Calculation: 428 R Axis:   144  Text Interpretation: Normal sinus rhythm Left posterior fascicular block When compared with ECG of 17-Apr-2024 10:52, No significant change since last tracing Confirmed by Gerard Frederick ([id number]) on 06/06/2024 1:43:04 PM    2d echo 06/03/2024 1. Left ventricular ejection  fraction, by estimation, is 55 to 60%. The  left ventricle has normal function. Left ventricular endocardial border  not optimally defined to evaluate regional wall motion. Left ventricular  diastolic parameters are consistent  with Grade II diastolic dysfunction (pseudonormalization).   2. Right ventricular systolic function is normal. The right ventricular  size is mildly enlarged. There is normal pulmonary artery systolic  pressure. Estimated PASP of .   3. A small pericardial effusion is present.   4. The mitral valve is myxomatous. Mild mitral valve regurgitation.   5. Tricuspid valve regurgitation is mild to moderate.   6. Aortic valve regurgitation is mild.   7. The inferior vena cava is normal in size with greater than 50%  respiratory variability, suggesting right atrial pressure of 3 mmHg.   cCTA 04/25/2024 IMPRESSION: 1. Mild CAD in the proximal second diagonal artery, 25-49% stenosis, CADRADS 2. Otherwise, minimal scattered plaque.   2. Total plaque volume 103 mm3 (calcified plaque 3 mm3; non-calcified plaque 100 mm3), which is 37th percentile for age- and sex-matched controls. TPV is moderate.   3. Coronary calcium  score is 12.2, which places the patient in the 49th percentile for age and sex matched control.   4. Normal coronary origins with right dominance.   5. Mild dilation of main pulmonary artery, 30 mm, may indicate elevated pulmonary pressures.   6. Small pericardial effusion, circumferential.   RECOMMENDATIONS: CAD-RADS 2. Mild non-obstructive CAD (25-49%). Consider non-atherosclerotic causes of chest pain. Consider preventive therapy and risk factor modification.   EP Procedures and Devices: 14-day event monitor (04/28/2022): Predominantly sinus rhythm with rare PACs and PVCs as well as a few brief episodes of PSVT.  No sustained arrhythmia observed. 3-day event monitor (08/27/2019): Predominantly sinus rhythm with rare PACs and frequent PVCs (PVC  burden 14.5%).  Single episode of brief PSVT also noted. 24-hour Holter monitor (11/13/2016): Rare PACs and PVCs.  Single 3 beat atrial run.  No sustained arrhythmia or prolonged pause.   Non-Invasive Evaluation(s): Pharmacologic MPI (10/31/2019): Low risk study without ischemia or scar.  No coronary artery calcification.  LVEF 69%.  PVCs noted during study. TTE (08/21/2019): Normal LV size and wall thickness.  LVEF 60-65% with grade 2 diastolic dysfunction.  Normal RV size and function.  Mild left atrial enlargement.  Mild to moderate tricuspid regurgitation.  Upper normal PA pressure.  Mildly elevated CVP.  Small pericardial effusion. Limited TTE (04/03/2017): Upper normal LV size with normal wall thickness.  LVEF 60-65% with normal wall motion.  Small pericardial effusion, stable to minimally increased in size from 10/2016. TTE (11/15/2016): Normal LV size with upper normal wall thickness.  LVEF 55-60% with normal wall motion and grade 1 diastolic dysfunction.  Mild aortic and tricuspid regurgitation.  Normal RV size and function dilated IVC.  Trivial pericardial effusion.  Risk Assessment/Calculations           Physical Exam VS:  BP 110/64 (BP Location: Left Arm, Patient Position: Sitting, Cuff Size: Normal)   Pulse 69   Ht 5' 3 (1.6 m)   Wt 137 lb (62.1 kg)   SpO2 96%   BMI 24.27 kg/m  Wt Readings from Last 3 Encounters:  06/06/24 137 lb (62.1 kg)  04/17/24 137 lb 3.2 oz (62.2 kg)  12/20/23 137 lb 9.6 oz (62.4 kg)    GEN: Well nourished, well developed in no acute distress NECK: No JVD; No carotid bruits CARDIAC: RRR, no murmurs, rubs, gallops RESPIRATORY:  Clear to auscultation without rales, wheezing or rhonchi  ABDOMEN: Soft, non-tender, non-distended EXTREMITIES:  No edema; No deformity   ASSESSMENT AND PLAN Mild nonobstructive coronary artery disease noted on coronary CTA.  She had 25-49% stenosis noted in the proximal second diagonal artery.  Coronary calcium  score  12.2.  Small pericardial effusion that is circumferential.  Recommendation was consider nonatherosclerotic causes of chest pain and consider preventative therapy and risk factor modification.  She was continued on rosuvastatin  5 mg daily.  Palpitations/PSVT/PACs/PVCs with only 2 episodes of palpitations since she was last seen in April.  She thinks that it was likely related to caffeine.  She was unable to tolerate metoprolol  but with this a few episodes we discussed if need be she can potentially do propranolol or a referral to EP for potential ablation.  But with limited episodes at this time we will watch and wait.  EKG today revealed sinus rhythm with left posterior fascicular block with no significant change from prior studies.  Hyperlipidemia hyperlipidemia with an LDL of 94.  Goal with mild nonobstructive disease would be less than 70 or less.  She has upcoming lab with her PCP.  Ongoing management per PCP.        Dispo: Patient to return to clinic as MD/APP in 6 months or sooner if needed for further evaluation  Signed, Elira Colasanti, NP

## 2024-06-10 ENCOUNTER — Telehealth: Payer: Self-pay

## 2024-06-10 NOTE — Telephone Encounter (Signed)
 Copied from CRM [id number]. Topic: Appointments - Scheduling Inquiry for Clinic >> Jun 10, 2024 10:51 AM [place] A wrote: Reason for CRM: Agent tried to schedule Physical for patient however Book it would not allow schedule only AWV which was already scheduled-please contact patient  I spoke with patient and scheduled an appointment for her to have her physical with Rollene Northern, FNP.  Patient has [employer] for Life and Medicare [employer] A [employer] B for insurance, so we are allowed to schedule a physical for her.

## 2024-06-10 NOTE — Telephone Encounter (Signed)
 NOTED

## 2024-08-04 ENCOUNTER — Ambulatory Visit: Payer: Medicare Other

## 2024-08-05 ENCOUNTER — Ambulatory Visit: Admitting: Family

## 2024-08-05 ENCOUNTER — Telehealth: Payer: Self-pay | Admitting: Family

## 2024-08-05 ENCOUNTER — Ambulatory Visit

## 2024-08-05 ENCOUNTER — Encounter: Payer: Self-pay | Admitting: Family

## 2024-08-05 VITALS — BP 120/60 | HR 77 | Temp 98.7°F | Ht 63.0 in | Wt 137.4 lb

## 2024-08-05 DIAGNOSIS — R072 Precordial pain: Secondary | ICD-10-CM

## 2024-08-05 DIAGNOSIS — E7800 Pure hypercholesterolemia, unspecified: Secondary | ICD-10-CM

## 2024-08-05 DIAGNOSIS — Z1231 Encounter for screening mammogram for malignant neoplasm of breast: Secondary | ICD-10-CM

## 2024-08-05 DIAGNOSIS — E559 Vitamin D deficiency, unspecified: Secondary | ICD-10-CM

## 2024-08-05 DIAGNOSIS — I700 Atherosclerosis of aorta: Secondary | ICD-10-CM

## 2024-08-05 DIAGNOSIS — G250 Essential tremor: Secondary | ICD-10-CM

## 2024-08-05 DIAGNOSIS — Z780 Asymptomatic menopausal state: Secondary | ICD-10-CM

## 2024-08-05 DIAGNOSIS — Z0000 Encounter for general adult medical examination without abnormal findings: Secondary | ICD-10-CM

## 2024-08-05 DIAGNOSIS — I493 Ventricular premature depolarization: Secondary | ICD-10-CM

## 2024-08-05 LAB — LIPID PANEL
Cholesterol: 206 mg/dL — ABNORMAL HIGH (ref 0–200)
HDL: 93.1 mg/dL (ref >39.00)
LDL Cholesterol: 101 mg/dL — ABNORMAL HIGH (ref 0–99)
NonHDL: 112.93
Total CHOL/HDL Ratio: 2
Triglycerides: 58 mg/dL (ref 0.0–149.0)
VLDL: 11.6 mg/dL (ref 0.0–40.0)

## 2024-08-05 LAB — CBC WITH DIFFERENTIAL/PLATELET
Basophils Absolute: 0 K/uL (ref 0.0–0.1)
Basophils Relative: 0.9 % (ref 0.0–3.0)
Eosinophils Absolute: 0.1 K/uL (ref 0.0–0.7)
Eosinophils Relative: 1.5 % (ref 0.0–5.0)
HCT: 41.4 % (ref 36.0–46.0)
Hemoglobin: 14.3 g/dL (ref 12.0–15.0)
Lymphocytes Relative: 27.6 % (ref 12.0–46.0)
Lymphs Abs: 1.3 K/uL (ref 0.7–4.0)
MCHC: 34.5 g/dL (ref 30.0–36.0)
MCV: 99 fl (ref 78.0–100.0)
Monocytes Absolute: 0.4 K/uL (ref 0.1–1.0)
Monocytes Relative: 8.3 % (ref 3.0–12.0)
Neutro Abs: 2.8 K/uL (ref 1.4–7.7)
Neutrophils Relative %: 61.7 % (ref 43.0–77.0)
Platelets: 209 K/uL (ref 150.0–400.0)
RBC: 4.18 Mil/uL (ref 3.87–5.11)
RDW: 13.4 % (ref 11.5–15.5)
WBC: 4.6 K/uL (ref 4.0–10.5)

## 2024-08-05 LAB — COMPREHENSIVE METABOLIC PANEL WITH GFR
ALT: 18 U/L (ref 0–35)
AST: 19 U/L (ref 0–37)
Albumin: 4.6 g/dL (ref 3.5–5.2)
Alkaline Phosphatase: 72 U/L (ref 39–117)
BUN: 17 mg/dL (ref 6–23)
CO2: 30 meq/L (ref 19–32)
Calcium: 9.5 mg/dL (ref 8.4–10.5)
Chloride: 103 meq/L (ref 96–112)
Creatinine, Ser: 0.64 mg/dL (ref 0.40–1.20)
GFR: 89.26 mL/min (ref >60.00)
Glucose, Bld: 84 mg/dL (ref 70–99)
Potassium: 4.4 meq/L (ref 3.5–5.1)
Sodium: 142 meq/L (ref 135–145)
Total Bilirubin: 1.1 mg/dL (ref 0.2–1.2)
Total Protein: 7.2 g/dL (ref 6.0–8.3)

## 2024-08-05 LAB — VITAMIN D 25 HYDROXY (VIT D DEFICIENCY, FRACTURES): VITD: 35.55 ng/mL (ref 30.00–100.00)

## 2024-08-05 LAB — TSH: TSH: 2.28 u[IU]/mL (ref 0.35–5.50)

## 2024-08-05 MED ORDER — ROSUVASTATIN CALCIUM 5 MG PO TABS
5.0000 mg | ORAL_TABLET | Freq: Every day | ORAL | 3 refills | Status: AC
Start: 2024-08-05 — End: 2025-07-31

## 2024-08-05 NOTE — Telephone Encounter (Signed)
 Records requested via efax on 08/05/24 at 09:24am

## 2024-08-05 NOTE — Patient Instructions (Addendum)
 Please call  and schedule your 3D mammogram and /or bone density scan as we discussed at [facility].  Health Maintenance for Postmenopausal Women Menopause is a normal process in which your ability to get pregnant comes to an end. This process happens slowly over many months or years, usually between the ages of 24 and 22. Menopause is complete when you have missed your menstrual period for 12 months. It is important to talk with your health care provider about some of the most common conditions that affect women after menopause (postmenopausal women). These include heart disease, cancer, and bone loss (osteoporosis). Adopting a healthy lifestyle and getting preventive care can help to promote your health and wellness. The actions you take can also lower your chances of developing some of these common conditions. What are the signs and symptoms of menopause? During menopause, you may have the following symptoms: Hot flashes. These can be moderate or severe. Night sweats. Decrease in sex drive. Mood swings. Headaches. Tiredness (fatigue). Irritability. Memory problems. Problems falling asleep or staying asleep. Talk with your health care provider about treatment options for your symptoms. Do I need hormone replacement therapy? Hormone replacement therapy is effective in treating symptoms that are caused by menopause, such as hot flashes and night sweats. Hormone replacement carries certain risks, especially as you become older. If you are thinking about using estrogen or estrogen with progestin, discuss the benefits and risks with your health care provider. How can I reduce my risk for heart disease and stroke? The risk of heart disease, heart attack, and stroke increases as you age. One of the causes may be a change in the body's hormones during menopause. This can affect how your body uses dietary fats, triglycerides, and cholesterol. Heart attack and stroke are medical emergencies. There  are many things that you can do to help prevent heart disease and stroke. Watch your blood pressure High blood pressure causes heart disease and increases the risk of stroke. This is more likely to develop in people who have high blood pressure readings or are overweight. Have your blood pressure checked: Every 3-5 years if you are 54-26 years of age. Every year if you are 76 years old or older. Eat a healthy diet  Eat a diet that includes plenty of vegetables, fruits, low-fat dairy products, and lean protein. Do not eat a lot of foods that are high in solid fats, added sugars, or sodium. Get regular exercise Get regular exercise. This is one of the most important things you can do for your health. Most adults should: Try to exercise for at least 150 minutes each week. The exercise should increase your heart rate and make you sweat (moderate-intensity exercise). Try to do strengthening exercises at least twice each week. Do these in addition to the moderate-intensity exercise. Spend less time sitting. Even light physical activity can be beneficial. Other tips Work with your health care provider to achieve or maintain a healthy weight. Do not use any products that contain nicotine or tobacco. These products include cigarettes, chewing tobacco, and vaping devices, such as e-cigarettes. If you need help quitting, ask your health care provider. Know your numbers. Ask your health care provider to check your cholesterol and your blood sugar (glucose). Continue to have your blood tested as directed by your health care provider. Do I need screening for cancer? Depending on your health history and family history, you may need to have cancer screenings at different stages of your life. This may include screening  for: Breast cancer. Cervical cancer. Lung cancer. Colorectal cancer. What is my risk for osteoporosis? After menopause, you may be at increased risk for osteoporosis. Osteoporosis is a  condition in which bone destruction happens more quickly than new bone creation. To help prevent osteoporosis or the bone fractures that can happen because of osteoporosis, you may take the following actions: If you are 65-36 years old, get at least 1,000 mg of calcium  and at least 600 international units (IU) of vitamin D  per day. If you are older than age 29 but younger than age 80, get at least 1,200 mg of calcium  and at least 600 international units (IU) of vitamin D  per day. If you are older than age 47, get at least 1,200 mg of calcium  and at least 800 international units (IU) of vitamin D  per day. Smoking and drinking excessive alcohol increase the risk of osteoporosis. Eat foods that are rich in calcium  and vitamin D , and do weight-bearing exercises several times each week as directed by your health care provider. How does menopause affect my mental health? Depression may occur at any age, but it is more common as you become older. Common symptoms of depression include: Feeling depressed. Changes in sleep patterns. Changes in appetite or eating patterns. Feeling an overall lack of motivation or enjoyment of activities that you previously enjoyed. Frequent crying spells. Talk with your health care provider if you think that you are experiencing any of these symptoms. General instructions See your health care provider for regular wellness exams and vaccines. This may include: Scheduling regular health, dental, and eye exams. Getting and maintaining your vaccines. These include: Influenza vaccine. Get this vaccine each year before the flu season begins. Pneumonia vaccine. Shingles vaccine. Tetanus, diphtheria, and pertussis (Tdap) booster vaccine. Your health care provider may also recommend other immunizations. Tell your health care provider if you have ever been abused or do not feel safe at home. Summary Menopause is a normal process in which your ability to get pregnant comes to an  end. This condition causes hot flashes, night sweats, decreased interest in sex, mood swings, headaches, or lack of sleep. Treatment for this condition may include hormone replacement therapy. Take actions to keep yourself healthy, including exercising regularly, eating a healthy diet, watching your weight, and checking your blood pressure and blood sugar levels. Get screened for cancer and depression. Make sure that you are up to date with all your vaccines. This information is not intended to replace advice given to you by your health care provider. Make sure you discuss any questions you have with your health care provider. Document Revised: 01/24/2021 Document Reviewed: 01/24/2021 Elsevier Patient Education  2024 [employer].

## 2024-08-05 NOTE — Telephone Encounter (Signed)
 Call  Following with [facility] Dr Claudene; patient reports normal pap per patient in 2025.  Need pap from this year sent over

## 2024-08-05 NOTE — Progress Notes (Signed)
 Assessment & Plan:  Vitamin D  deficiency -     VITAMIN D  25 Hydroxy (Vit-D Deficiency, Fractures) -     HM DEXA SCAN  Precordial pain  Routine general medical examination at a health care facility Assessment & Plan: Breast exam performed today.  Deferred pelvic exam as patient is following with GYN.  Requesting Pap smear.  Patient will schedule mammogram and bone density.  Congratulated patient on diligence to exercise  Orders: -     Lipid panel -     TSH -     Rosuvastatin  Calcium ; Take 1 tablet (5 mg total) by mouth daily.  Dispense: 90 tablet; Refill: 3  PVC's (premature ventricular contractions) -     TSH -     CBC with Differential/Platelet -     Comprehensive metabolic panel with GFR  Essential tremor -     TSH  Pure hypercholesterolemia -     Lipid panel -     Rosuvastatin  Calcium ; Take 1 tablet (5 mg total) by mouth daily.  Dispense: 90 tablet; Refill: 3  Encounter for screening mammogram for malignant neoplasm of breast -     3D Screening Mammogram, Left and Right  Aortic atherosclerosis Assessment & Plan: Chronic, symptomatically stable.  Continue Crestor  5 mg daily.  She will continue following with cardiology.      Return precautions given.   Risks, benefits, and alternatives of the medications and treatment plan prescribed today were discussed, and patient expressed understanding.   Education regarding symptom management and diagnosis given to patient on AVS either electronically or printed.  No follow-ups on file.  Kelly Northern, FNP  Subjective:    Patient ID: Kelly [name], female    DOB: October 19, 1955, 68 y.o.   MRN: [id number]  CC: Kelly [name] is a 68 y.o. female who presents today for physical exam.    HPI: HPI Discussed the use of AI scribe software for clinical note transcription with the patient, who gave verbal consent to proceed.  History of Present Illness   Kelly [name] is a 68 year old female who presents for  CPE   She has been experiencing episodes of palpitations, with a significant episode occurring at the end of July after returning from visiting her son. She has cut out caffeine entirely, without recurrence of palpations. She now drinks herbal teas and decaf coffee.  She is compliant with Crestor  5 mg.  She engages in regular physical activity, attending exercise classes twice a week and participating in Zumba at the [facility] on Saturdays.      Colorectal Cancer Screening: UTD , 2024; repeat in 5 years  Breast Cancer Screening: Mammogram UTD Cervical Cancer Screening: Following with [facility] Dr Claudene; patient reports normal pap per patient in 2025.   01/2021 negative malignancy ( no HPV status) Bone Health screening/DEXA for 65+: due  Lung Cancer Screening: Doesn't have 20 year pack year history and age > 42 years yo 81 years        Tetanus - UTD        Pneumococcal - Complete Exercise: Gets regular exercise.   Alcohol use: Occasional Smoking/tobacco use: Nonsmoker.    Health Maintenance  Topic Date Due   Zoster (Shingles) Vaccine (1 of 2) Never done   Medicare Annual Wellness Visit  07/31/2024   COVID-19 Vaccine (9 - Pfizer risk 2025-26 season) 12/01/2024   Breast Cancer Screening  10/29/2025   Colon Cancer Screening  01/01/2028   DTaP/Tdap/Td vaccine (4 -  Td or Tdap) 01/30/2029   Pneumococcal Vaccine for age over 55  Completed   Flu Shot  Completed   DEXA scan (bone density measurement)  Completed   Hepatitis C Screening  Completed   Meningitis B Vaccine  Aged Out    ALLERGIES: Estradiol, Pantoprazole sodium, and Prednisolone  Current Outpatient Medications on File Prior to Visit  Medication Sig Dispense Refill   acetaminophen (TYLENOL) 650 MG CR tablet Take 650 mg by mouth as needed.     cholecalciferol (VITAMIN D ) 1000 UNITS tablet Take 1,000 Units by mouth daily.     Multiple Vitamins-Minerals (ICAPS AREDS 2 PO)      metoprolol  tartrate (LOPRESSOR ) 25  MG tablet Take 0.5 tablets (12.5 mg total) by mouth daily as needed (palpitations). (Patient not taking: Reported on 08/05/2024) 30 tablet 3   No current facility-administered medications on file prior to visit.    Review of Systems  Constitutional:  Negative for chills and fever.  Respiratory:  Negative for cough.   Cardiovascular:  Negative for chest pain and palpitations.  Gastrointestinal:  Negative for nausea and vomiting.      Objective:    BP 120/60   Pulse 77   Temp 98.7 F (37.1 C) (Oral)   Ht 5' 3 (1.6 m)   Wt 137 lb 6.4 oz (62.3 kg)   SpO2 96%   BMI 24.34 kg/m   BP Readings from Last 3 Encounters:  08/05/24 120/60  06/06/24 110/64  04/25/24 128/70   Wt Readings from Last 3 Encounters:  08/05/24 137 lb 6.4 oz (62.3 kg)  06/06/24 137 lb (62.1 kg)  04/17/24 137 lb 3.2 oz (62.2 kg)    Physical Exam Vitals reviewed.  Constitutional:      Appearance: Normal appearance. She is well-developed.  Eyes:     Conjunctiva/sclera: Conjunctivae normal.  Neck:     Thyroid : No thyroid  mass or thyromegaly.  Cardiovascular:     Rate and Rhythm: Normal rate and regular rhythm.     Pulses: Normal pulses.     Heart sounds: Normal heart sounds.  Pulmonary:     Effort: Pulmonary effort is normal.     Breath sounds: Normal breath sounds. No wheezing, rhonchi or rales.  Chest:  Breasts:    Breasts are symmetrical.     Right: No inverted nipple, mass, nipple discharge, skin change or tenderness.     Left: No inverted nipple, mass, nipple discharge, skin change or tenderness.  Abdominal:     General: Bowel sounds are normal. There is no distension.     Palpations: Abdomen is soft. Abdomen is not rigid. There is no fluid wave or mass.     Tenderness: There is no abdominal tenderness. There is no guarding or rebound.  Lymphadenopathy:     Head:     Right side of head: No submental, submandibular, tonsillar, preauricular, posterior auricular or occipital adenopathy.     Left  side of head: No submental, submandibular, tonsillar, preauricular, posterior auricular or occipital adenopathy.     Cervical: No cervical adenopathy.     Right cervical: No superficial, deep or posterior cervical adenopathy.    Left cervical: No superficial, deep or posterior cervical adenopathy.  Skin:    General: Skin is warm and dry.  Neurological:     Mental Status: She is alert.  Psychiatric:        Speech: Speech normal.        Behavior: Behavior normal.        Thought  Content: Thought content normal.

## 2024-08-05 NOTE — Assessment & Plan Note (Signed)
 Breast exam performed today.  Deferred pelvic exam as patient is following with GYN.  Requesting Pap smear.  Patient will schedule mammogram and bone density.  Congratulated patient on diligence to exercise

## 2024-08-05 NOTE — Assessment & Plan Note (Signed)
 Chronic, symptomatically stable.  Continue Crestor  5 mg daily.  She will continue following with cardiology.

## 2024-08-07 ENCOUNTER — Ambulatory Visit: Payer: Self-pay | Admitting: Family

## 2024-12-09 ENCOUNTER — Ambulatory Visit: Admitting: Cardiology
